# Patient Record
Sex: Female | Born: 1970
Health system: Southern US, Community
[De-identification: ages and names within clinical notes are randomized; demographics above are authoritative.]

## PROBLEM LIST (undated history)

## (undated) DIAGNOSIS — M4800 Spinal stenosis, site unspecified: Secondary | ICD-10-CM

## (undated) DIAGNOSIS — N809 Endometriosis, unspecified: Secondary | ICD-10-CM

## (undated) DIAGNOSIS — F32A Depression, unspecified: Secondary | ICD-10-CM

## (undated) DIAGNOSIS — D649 Anemia, unspecified: Secondary | ICD-10-CM

## (undated) DIAGNOSIS — M810 Age-related osteoporosis without current pathological fracture: Secondary | ICD-10-CM

## (undated) DIAGNOSIS — E785 Hyperlipidemia, unspecified: Secondary | ICD-10-CM

## (undated) DIAGNOSIS — S83209A Unspecified tear of unspecified meniscus, current injury, unspecified knee, initial encounter: Secondary | ICD-10-CM

## (undated) DIAGNOSIS — T8859XA Other complications of anesthesia, initial encounter: Secondary | ICD-10-CM

## (undated) DIAGNOSIS — S52611A Displaced fracture of right ulna styloid process, initial encounter for closed fracture: Secondary | ICD-10-CM

## (undated) DIAGNOSIS — F419 Anxiety disorder, unspecified: Secondary | ICD-10-CM

## (undated) DIAGNOSIS — Z9889 Other specified postprocedural states: Secondary | ICD-10-CM

## (undated) DIAGNOSIS — G4486 Cervicogenic headache: Secondary | ICD-10-CM

## (undated) DIAGNOSIS — F329 Major depressive disorder, single episode, unspecified: Secondary | ICD-10-CM

## (undated) DIAGNOSIS — R112 Nausea with vomiting, unspecified: Secondary | ICD-10-CM

## (undated) HISTORY — PX: BUNIONECTOMY: SHX129

## (undated) HISTORY — DX: Hyperlipidemia, unspecified: E78.5

## (undated) HISTORY — DX: Unspecified tear of unspecified meniscus, current injury, unspecified knee, initial encounter: S83.209A

## (undated) HISTORY — DX: Anxiety disorder, unspecified: F41.9

## (undated) HISTORY — PX: SHOULDER SURGERY: SHX246

## (undated) HISTORY — PX: LAPAROSCOPIC SUPRACERVICAL HYSTERECTOMY: SUR797

## (undated) HISTORY — PX: KNEE ARTHROSCOPY: SUR90

## (undated) HISTORY — DX: Endometriosis, unspecified: N80.9

## (undated) HISTORY — DX: Anemia, unspecified: D64.9

## (undated) HISTORY — DX: Age-related osteoporosis without current pathological fracture: M81.0

## (undated) HISTORY — PX: CERVICAL SPINE SURGERY: SHX589

## (undated) HISTORY — PX: KNEE SURGERY: SHX244

## (undated) HISTORY — PX: SHOULDER ARTHROSCOPY: SHX128

## (undated) HISTORY — DX: Cervicogenic headache: G44.86

## (undated) HISTORY — DX: Spinal stenosis, site unspecified: M48.00

## (undated) HISTORY — DX: Depression, unspecified: F32.A

---
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
## 2009-07-09 HISTORY — PX: TUBAL LIGATION: SHX77

## 2010-10-04 LAB — HM COLONOSCOPY

## 2011-06-27 DIAGNOSIS — F900 Attention-deficit hyperactivity disorder, predominantly inattentive type: Secondary | ICD-10-CM | POA: Insufficient documentation

## 2011-07-10 HISTORY — PX: LAPAROSCOPIC HYSTERECTOMY: SHX1926

## 2011-07-10 HISTORY — PX: SUPRACERVICAL ABDOMINAL HYSTERECTOMY: SHX5393

## 2017-08-25 DIAGNOSIS — J209 Acute bronchitis, unspecified: Secondary | ICD-10-CM | POA: Diagnosis not present

## 2017-10-24 DIAGNOSIS — Z6835 Body mass index (BMI) 35.0-35.9, adult: Secondary | ICD-10-CM | POA: Diagnosis not present

## 2017-10-24 DIAGNOSIS — R635 Abnormal weight gain: Secondary | ICD-10-CM | POA: Diagnosis not present

## 2017-10-24 DIAGNOSIS — Z1151 Encounter for screening for human papillomavirus (HPV): Secondary | ICD-10-CM | POA: Diagnosis not present

## 2017-10-24 DIAGNOSIS — Z01419 Encounter for gynecological examination (general) (routine) without abnormal findings: Secondary | ICD-10-CM | POA: Diagnosis not present

## 2017-10-24 DIAGNOSIS — Z1231 Encounter for screening mammogram for malignant neoplasm of breast: Secondary | ICD-10-CM | POA: Diagnosis not present

## 2017-10-24 LAB — HM PAP SMEAR

## 2017-10-24 LAB — HM MAMMOGRAPHY

## 2017-10-28 LAB — HM PAP SMEAR

## 2017-12-30 LAB — HM COLONOSCOPY

## 2017-12-30 LAB — HM MAMMOGRAPHY

## 2017-12-30 LAB — HM PAP SMEAR

## 2018-01-01 ENCOUNTER — Other Ambulatory Visit: Payer: Self-pay | Admitting: Family Medicine

## 2018-01-01 MED ORDER — ESCITALOPRAM OXALATE 10 MG PO TABS: 10.0000 mg | ORAL_TABLET | Freq: Every day | ORAL | 0 refills | Status: DC

## 2018-01-01 NOTE — Progress Notes (Signed)
Patient has long wait until she is in with PCP. Has long standing history of anxiety. Would like to start back on this. Starting to affect her job/home life. Was on lexapro in the past and would like to try this again. Starting low dose at 10mg . Side effects discussed. If any issues she is to let me know. Will f/u with pcp.

## 2018-01-06 ENCOUNTER — Other Ambulatory Visit: Payer: Self-pay | Admitting: Family Medicine

## 2018-01-06 MED ORDER — BUSPIRONE HCL 7.5 MG PO TABS: 7.5000 mg | ORAL_TABLET | Freq: Three times a day (TID) | ORAL | 1 refills | Status: DC

## 2018-01-06 NOTE — Progress Notes (Signed)
Did not tolerate lexapro. She had a bad panic attack and does not want to stick this out as I explained that can make anxiety worse, but usually gets better after 2 weeks. We are going to try buspar instead and see how she does on this. Went over medication in detail, side effects, proper way to take, etc..

## 2018-01-20 MED FILL — ESTRADIOL 10 MCG TABS: 10 | 28 days supply | Qty: 8 | Fill #0

## 2018-02-05 DIAGNOSIS — H524 Presbyopia: Secondary | ICD-10-CM | POA: Diagnosis not present

## 2018-02-05 DIAGNOSIS — H5203 Hypermetropia, bilateral: Secondary | ICD-10-CM | POA: Diagnosis not present

## 2018-02-05 DIAGNOSIS — H52223 Regular astigmatism, bilateral: Secondary | ICD-10-CM | POA: Diagnosis not present

## 2018-02-07 DIAGNOSIS — E669 Obesity, unspecified: Secondary | ICD-10-CM | POA: Diagnosis not present

## 2018-02-07 DIAGNOSIS — F4320 Adjustment disorder, unspecified: Secondary | ICD-10-CM | POA: Diagnosis not present

## 2018-02-07 DIAGNOSIS — Z6835 Body mass index (BMI) 35.0-35.9, adult: Secondary | ICD-10-CM | POA: Diagnosis not present

## 2018-02-07 MED FILL — busPIRone HCL 7.5 MG TABS: 7.5 | 30 days supply | Qty: 90 | Fill #0

## 2018-02-07 MED FILL — FLUoxetine HCL 10 MG CAPS: 10 | 90 days supply | Qty: 90 | Fill #0

## 2018-02-10 ENCOUNTER — Other Ambulatory Visit: Payer: Self-pay | Admitting: *Deleted

## 2018-02-10 ENCOUNTER — Other Ambulatory Visit: Payer: Self-pay | Admitting: Family Medicine

## 2018-02-10 MED ORDER — HYDROXYZINE HCL 25 MG PO TABS: 25.0000 mg | ORAL_TABLET | Freq: Three times a day (TID) | ORAL | 1 refills | Status: DC | PRN

## 2018-02-10 MED FILL — hydrOXYzine HCL 25 MG TABS: 25 | 30 days supply | Qty: 90 | Fill #0

## 2018-02-10 NOTE — Progress Notes (Signed)
Hydroxyzine prn for panic attacks.

## 2018-02-10 NOTE — Telephone Encounter (Signed)
Called [facility] and cancelled Rx for Hydroxyine and resent Rx to [place].

## 2018-02-11 ENCOUNTER — Encounter: Payer: Self-pay | Admitting: Family Medicine

## 2018-02-11 ENCOUNTER — Other Ambulatory Visit: Payer: Self-pay | Admitting: Family Medicine

## 2018-02-11 ENCOUNTER — Ambulatory Visit (INDEPENDENT_AMBULATORY_CARE_PROVIDER_SITE_OTHER): Payer: Self-pay | Admitting: Family Medicine

## 2018-02-11 VITALS — BP 126/70 | HR 80 | Temp 97.8°F | Resp 16 | Ht 64.0 in | Wt 203.0 lb

## 2018-02-11 DIAGNOSIS — Z79890 Hormone replacement therapy (postmenopausal): Secondary | ICD-10-CM | POA: Insufficient documentation

## 2018-02-11 DIAGNOSIS — Z0000 Encounter for general adult medical examination without abnormal findings: Secondary | ICD-10-CM

## 2018-02-11 DIAGNOSIS — R5383 Other fatigue: Secondary | ICD-10-CM

## 2018-02-11 DIAGNOSIS — E782 Mixed hyperlipidemia: Secondary | ICD-10-CM

## 2018-02-11 DIAGNOSIS — E559 Vitamin D deficiency, unspecified: Secondary | ICD-10-CM

## 2018-02-11 DIAGNOSIS — M4802 Spinal stenosis, cervical region: Secondary | ICD-10-CM | POA: Insufficient documentation

## 2018-02-11 DIAGNOSIS — E7849 Other hyperlipidemia: Secondary | ICD-10-CM | POA: Insufficient documentation

## 2018-02-11 DIAGNOSIS — F411 Generalized anxiety disorder: Secondary | ICD-10-CM

## 2018-02-11 DIAGNOSIS — E785 Hyperlipidemia, unspecified: Secondary | ICD-10-CM | POA: Insufficient documentation

## 2018-02-11 DIAGNOSIS — Z23 Encounter for immunization: Secondary | ICD-10-CM

## 2018-02-11 LAB — CBC WITH DIFFERENTIAL/PLATELET
BASOS PCT: 0.5 % (ref 0.0–3.0)
Basophils Absolute: 0 10*3/uL (ref 0.0–0.1)
EOS ABS: 0.2 10*3/uL (ref 0.0–0.7)
Eosinophils Relative: 4 % (ref 0.0–5.0)
HEMATOCRIT: 37.9 % (ref 36.0–46.0)
Hemoglobin: 13.2 g/dL (ref 12.0–15.0)
LYMPHS ABS: 1.2 10*3/uL (ref 0.7–4.0)
LYMPHS PCT: 27.3 % (ref 12.0–46.0)
MCHC: 34.7 g/dL (ref 30.0–36.0)
MCV: 84.5 fl (ref 78.0–100.0)
MONOS PCT: 5.9 % (ref 3.0–12.0)
Monocytes Absolute: 0.3 10*3/uL (ref 0.1–1.0)
NEUTROS ABS: 2.7 10*3/uL (ref 1.4–7.7)
NEUTROS PCT: 62.3 % (ref 43.0–77.0)
PLATELETS: 209 10*3/uL (ref 150.0–400.0)
RBC: 4.49 Mil/uL (ref 3.87–5.11)
RDW: 12.8 % (ref 11.5–15.5)
WBC: 4.3 10*3/uL (ref 4.0–10.5)

## 2018-02-11 LAB — COMPREHENSIVE METABOLIC PANEL
ALT: 14 U/L (ref 0–35)
AST: 13 U/L (ref 0–37)
Albumin: 4.4 g/dL (ref 3.5–5.2)
Alkaline Phosphatase: 68 U/L (ref 39–117)
BUN: 12 mg/dL (ref 6–23)
CALCIUM: 9.4 mg/dL (ref 8.4–10.5)
CHLORIDE: 104 meq/L (ref 96–112)
CO2: 28 meq/L (ref 19–32)
Creatinine, Ser: 0.92 mg/dL (ref 0.40–1.20)
GFR: 69.12 mL/min (ref >60.00)
GLUCOSE: 94 mg/dL (ref 70–99)
Potassium: 3.6 mEq/L (ref 3.5–5.1)
Sodium: 140 mEq/L (ref 135–145)
Total Bilirubin: 0.7 mg/dL (ref 0.2–1.2)
Total Protein: 7.1 g/dL (ref 6.0–8.3)

## 2018-02-11 LAB — LIPID PANEL
CHOL/HDL RATIO: 4
Cholesterol: 259 mg/dL — ABNORMAL HIGH (ref 0–200)
HDL: 69.8 mg/dL (ref >39.00)
LDL Cholesterol: 177 mg/dL — ABNORMAL HIGH (ref 0–99)
NONHDL: 189.68
Triglycerides: 62 mg/dL (ref 0.0–149.0)
VLDL: 12.4 mg/dL (ref 0.0–40.0)

## 2018-02-11 LAB — VITAMIN D 25 HYDROXY (VIT D DEFICIENCY, FRACTURES): VITD: 19.68 ng/mL — AB (ref 30.00–100.00)

## 2018-02-11 LAB — TSH: TSH: 1.37 u[IU]/mL (ref 0.35–4.50)

## 2018-02-11 MED ORDER — VITAMIN D (ERGOCALCIFEROL) 1.25 MG (50000 UNIT) PO CAPS: ORAL_CAPSULE | ORAL | 0 refills | Status: DC

## 2018-02-11 MED ORDER — CYCLOBENZAPRINE HCL 10 MG PO TABS: 10.0000 mg | ORAL_TABLET | Freq: Three times a day (TID) | ORAL | 1 refills | Status: DC | PRN

## 2018-02-11 MED FILL — CYCLOBENZAPRINE 10 MG TAB: 10 | 10 days supply | Qty: 30 | Fill #0

## 2018-02-11 MED FILL — VIT D2 1.25 MG (50,000 UNIT: 1.25 MG | 84 days supply | Qty: 12 | Fill #0

## 2018-02-11 NOTE — Progress Notes (Unsigned)
Patient: Christine Nash MRN: [id number] DOB: 07/28/1970 PCP: No primary care provider on file.     Subjective:  No chief complaint on file.   HPI: The patient is a 47 y.o. female who presents today for annual exam. She denies any changes to past medical history. There have been no recent hospitalizations. They are following a well balanced diet and exercise plan. Weight has been stable. Would like to discuss anxiety today.   Anxiety: History of remote anxiety.   There is no immunization history on file for this patient.   Colonoscopy: Mammogram: 12/30/2017 Pap smear: 12/30/2017 Tdap:  hiv screen: done    Review of Systems  Allergies Patient has no allergies on file.  Past Medical History Patient  has no past medical history on file.  Surgical History Patient  has a past surgical history that includes Cervical spine surgery (2011,2012,2017); Cesarean section (3329,5188); Tubal ligation (2011); and Laparoscopic hysterectomy (2013).  Family History Pateint's family history is not on file.  Social History Patient      Objective: There were no vitals filed for this visit.  There is no height or weight on file to calculate BMI.  Physical Exam     Assessment/plan:    No follow-ups on file.     Orma Flaming, MD [place]  02/11/2018
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
## 2018-02-21 MED FILL — ESTRADIOL 0.5 MG TABLET: 0.5 | 30 days supply | Qty: 30 | Fill #0

## 2018-03-24 MED FILL — ESTRADIOL 0.5 MG TABLET: 0.5 | 30 days supply | Qty: 30 | Fill #1

## 2018-03-24 MED FILL — ESTRADIOL 10 MCG TABS: 10 | 28 days supply | Qty: 8 | Fill #1

## 2018-04-03 MED FILL — busPIRone HCL 7.5 MG TABS: 7.5 | 30 days supply | Qty: 90 | Fill #0

## 2018-04-03 MED FILL — hydrOXYzine HCL 25 MG TABS: 25 | 30 days supply | Qty: 90 | Fill #1

## 2018-04-16 ENCOUNTER — Other Ambulatory Visit: Payer: Self-pay | Admitting: Family Medicine

## 2018-04-16 MED ORDER — BACLOFEN 20 MG PO TABS: 20.0000 mg | ORAL_TABLET | Freq: Three times a day (TID) | ORAL | 0 refills | Status: DC

## 2018-04-16 MED FILL — BACLOFEN 20 MG TABLET: 20 | 30 days supply | Qty: 90 | Fill #0

## 2018-05-03 ENCOUNTER — Other Ambulatory Visit: Payer: Self-pay | Admitting: Family Medicine

## 2018-05-03 MED ORDER — AZITHROMYCIN 250 MG PO TABS: ORAL_TABLET | ORAL | 0 refills | Status: DC

## 2018-05-03 NOTE — Progress Notes (Signed)
Pt called and stated getting worse form when I listened to her and saw her this week. No fever/chills. Precautions given and zpack sent in.

## 2018-05-12 MED FILL — ESTRADIOL 0.5 MG TABLET: 0.5 | 30 days supply | Qty: 30 | Fill #2

## 2018-05-12 MED FILL — ESTRADIOL 10 MCG TABS: 10 | 28 days supply | Qty: 8 | Fill #2

## 2018-05-29 ENCOUNTER — Other Ambulatory Visit: Payer: Self-pay | Admitting: Family Medicine

## 2018-05-29 MED ORDER — BACLOFEN 20 MG PO TABS: 20.0000 mg | ORAL_TABLET | Freq: Three times a day (TID) | ORAL | 1 refills | Status: DC

## 2018-05-29 MED FILL — BACLOFEN 20 MG TABS: 20 | 30 days supply | Qty: 90 | Fill #0

## 2018-06-16 MED FILL — ESTRADIOL 0.5 MG TABLET: 0.5 | 30 days supply | Qty: 30 | Fill #3

## 2018-06-23 ENCOUNTER — Other Ambulatory Visit: Payer: Self-pay | Admitting: Family Medicine

## 2018-06-23 MED ORDER — BUSPIRONE HCL 7.5 MG PO TABS: 7.5000 mg | ORAL_TABLET | Freq: Three times a day (TID) | ORAL | 1 refills | Status: DC

## 2018-06-23 MED FILL — busPIRone HCL 7.5 MG TABS: 7.5 | 30 days supply | Qty: 90 | Fill #0

## 2018-07-03 MED FILL — ESTRADIOL 10 MCG TABS: 10 | 28 days supply | Qty: 8 | Fill #3

## 2018-07-03 NOTE — Progress Notes (Signed)
Christine Nash is a 47 y.o. female is here for   History of Present Illness:   Christine Nash, CMA acting as scribe for Dr. Briscoe Nash.   HPI: Patient in office for evaluation on weight management. She has tried phentermine in 2003 and lost 35lbs with it. She has been on adderall in past for ADD. She is not currently taking anything for ADD at this time. Found that Adderall 10 mg q am helped with focus but made her have hand tremors. Not drinking caffeine regularly. She exercises one day a week for about 45min. She has tried different diets in the past with good results but gained weight back soon after. Married x 12 years and very happy, working at [employer] x 7 months and happy. Sleeps fairly well, takes baclofen for chronic neck pain. No ETOH, drugs, supplements. Some binging behavior - eating after full. No purging or restricting. Eating well at work - mostly salads. Eating at restaurants too often at night. 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 No barriers to success otherwise.   Health Maintenance Due  Topic Date Due  . INFLUENZA VACCINE  02/06/2018   Depression screen PHQ 2/9 02/11/2018  Decreased Interest 1  Down, Depressed, Hopeless 1  PHQ - 2 Score 2  Altered sleeping 0  Tired, decreased energy 2  Change in appetite 1  Feeling bad or failure about yourself  2  Trouble concentrating 1  Moving slowly or fidgety/restless 0  Suicidal thoughts 0  PHQ-9 Score 8  Difficult doing work/chores Not difficult at all   PMHx, SurgHx, SocialHx, FamHx, Medications, and Allergies were reviewed in the Visit Navigator and updated as appropriate.   Patient Active Problem List   Diagnosis Date Noted  . Generalized anxiety disorder 02/11/2018  . Hormone replacement therapy (HRT) 02/11/2018  . Spinal stenosis of cervical region 02/11/2018  . Hyperlipidemia 02/11/2018  . Vitamin D deficiency 02/11/2018  . Attention deficit hyperactivity disorder, predominantly inattentive type  06/27/2011   Social History   Tobacco Use  . Smoking status: Never Smoker  . Smokeless tobacco: Never Used  Substance Use Topics  . Alcohol use: Not Currently    Frequency: Never  . Drug use: Never   Current Medications and Allergies:   .  baclofen (LIORESAL) 20 MG tablet, Take 1 tablet (20 mg total) by mouth 3 (three) times daily., Disp: 90 tablet, Rfl: 1 .  busPIRone (BUSPAR) 7.5 MG tablet, Take 1 tablet (7.5 mg total) by mouth 3 (three) times daily., Disp: 90 tablet, Rfl: 1 .  estradiol (ESTRACE) 0.5 MG tablet, Take 0.5 mg by mouth daily., Disp: , Rfl: 10 .  Estradiol 10 MCG TABS vaginal tablet, INSERT 1 TABLET INTO THE VAGINA TWICE A WEEK, Disp: , Rfl: 0 .  hydrOXYzine (ATARAX/VISTARIL) 25 MG tablet, Take 1 tablet (25 mg total) by mouth 3 (three) times daily as needed for anxiety., Disp: 90 tablet, Rfl: 1 .  Vitamin D, Ergocalciferol, (DRISDOL) 50000 units CAPS capsule, One capsule by mouth once a week for 12 weeks. Then take 2000IU/day, Disp: 12 capsule, Rfl: 0   Allergies  Allergen Reactions  . Meperidine Hcl Hives  . Prochlorperazine Edisylate     Other reaction(s): agitiation   Review of Systems   Pertinent items are noted in the HPI. Otherwise, a complete ROS is negative.  Vitals:   Vitals:   07/04/18 1030  BP: 124/82  Pulse: 72  Temp: (!) 97.2 F (36.2 C)  TempSrc: Oral  SpO2: 98%  Weight: 210 lb 6.4 oz (95.4 kg)  Height: 5\' 4"  (1.626 m)     Body mass index is 36.12 kg/m.  Physical Exam:   Physical Exam Vitals signs and nursing note reviewed.  HENT:     Head: Normocephalic and atraumatic.  Eyes:     Pupils: Pupils are equal, round, and reactive to light.  Neck:     Musculoskeletal: Normal range of motion and neck supple.  Cardiovascular:     Rate and Rhythm: Normal rate and regular rhythm.     Heart sounds: Normal heart sounds.  Pulmonary:     Effort: Pulmonary effort is normal.  Abdominal:     Palpations: Abdomen is soft.  Skin:    General:  Skin is warm.  Psychiatric:        Behavior: Behavior normal.    Results for orders placed or performed in visit on 07/04/18  POCT glycosylated hemoglobin (Hb A1C)  Result Value Ref Range   Hemoglobin A1C 5.0 4.0 - 5.6 %   HbA1c POC (<> result, manual entry)     HbA1c, POC (prediabetic range)     HbA1c, POC (controlled diabetic range)     Assessment and Plan:   Christine Nash was seen today for obesity.  Diagnoses and all orders for this visit:  Generalized anxiety disorder Comments: Trial propranolol to help with anxiety, tremors, headaches, BP elevations.  Orders: -     propranolol (INDERAL) 20 MG tablet; Take 1 tablet (20 mg total) by mouth 3 (three) times daily as needed.  Hyperglycemia Comments: Normal A1c today.  Orders: -     POCT glycosylated hemoglobin (Hb A1C)  Weight gain Comments: 7 pounds since August visit with PCP.  Orders: -     POCT glycosylated hemoglobin (Hb A1C) -     phentermine (ADIPEX-P) 37.5 MG tablet; 1/2 to 1 po q am  Binge eating Comments: Mild to moderate. She will work on mindful eating and non-food coping skills. Orders: -     phentermine (ADIPEX-P) 37.5 MG tablet; 1/2 to 1 po q am  Attention deficit hyperactivity disorder, predominantly inattentive type Comments: May try Vyvanse in the future.   Morbid obesity ([place]) Comments: Patient complains of obesity. Patient cites health, increased physical ability, self-image as reasons for wanting to lose weight.  BMI Readings from Last 3 Encounters:  07/04/18 36.12 kg/m  02/11/18 34.84 kg/m   Wt Readings from Last 3 Encounters:  07/04/18 210 lb 6.4 oz (95.4 kg)  02/11/18 203 lb (92.1 kg)   She feels ideal weight is 65 pounds lower than she is currently..   Contraindications to weight loss: none. Patient readiness to commit to diet and activity changes: excellent. Barriers to weight loss: time limitations.  I assessed Christine Nash to be in an action stage with respect to weight loss.     Plan: 1. Diagnostic studies to rule out secondary causes of obesity: see below. 2. General patient education:  a. Importance of long-term maintenance treatment in weight loss. b. Use non-food self-rewards to reinforce behavior changes. c. Elicit support from others; identify saboteurs. 3. Diet interventions:  a. Risks of dieting were reviewed, including fatigue, temporary hair loss, gallstone formation, gout, and with very low calorie diets, electrolyte abnormalities, nutrient inadequacies, and loss of lean body mass. 4. Exercise intervention:  a. Informal measures, e.g. taking stairs instead of elevator. b. Formal exercise regimen options discussed as well. 5. Other behavioral treatment: stress management. 6. Other treatment: Medication: phentermine. 7. Patient to keep a weight  log that we will review at follow up. 8. Follow up: 1 month and as needed.   . Orders and follow up as documented in [place], reviewed diet, exercise and weight control, cardiovascular risk and specific lipid/LDL goals reviewed, reviewed medications and side effects in detail.  . Reviewed expectations re: course of current medical issues. . Outlined signs and symptoms indicating need for more acute intervention. . Patient verbalized understanding and all questions were answered. . Patient received an After Visit Summary.  CMA served as Education administrator during this visit. History, Physical, and Plan performed by medical provider. The above documentation has been reviewed and is accurate and complete. Christine Nash, D.O.  Christine Deutscher, DO [place], [facility] 07/04/2018

## 2018-07-04 ENCOUNTER — Encounter: Payer: Self-pay | Admitting: Family Medicine

## 2018-07-04 ENCOUNTER — Ambulatory Visit (INDEPENDENT_AMBULATORY_CARE_PROVIDER_SITE_OTHER): Payer: 59 | Admitting: Family Medicine

## 2018-07-04 VITALS — BP 124/82 | HR 72 | Temp 97.2°F | Ht 64.0 in | Wt 210.4 lb

## 2018-07-04 DIAGNOSIS — R632 Polyphagia: Secondary | ICD-10-CM | POA: Diagnosis not present

## 2018-07-04 DIAGNOSIS — R635 Abnormal weight gain: Secondary | ICD-10-CM

## 2018-07-04 DIAGNOSIS — F411 Generalized anxiety disorder: Secondary | ICD-10-CM | POA: Diagnosis not present

## 2018-07-04 DIAGNOSIS — F900 Attention-deficit hyperactivity disorder, predominantly inattentive type: Secondary | ICD-10-CM | POA: Diagnosis not present

## 2018-07-04 DIAGNOSIS — R739 Hyperglycemia, unspecified: Secondary | ICD-10-CM | POA: Diagnosis not present

## 2018-07-04 LAB — POCT GLYCOSYLATED HEMOGLOBIN (HGB A1C): Hemoglobin A1C: 5 % (ref 4.0–5.6)

## 2018-07-04 MED ORDER — PROPRANOLOL HCL 20 MG PO TABS: 20.0000 mg | ORAL_TABLET | Freq: Three times a day (TID) | ORAL | 3 refills | Status: DC | PRN

## 2018-07-04 MED ORDER — PHENTERMINE HCL 37.5 MG PO TABS: ORAL_TABLET | ORAL | 3 refills | Status: DC

## 2018-07-04 MED FILL — PHENTERMINE 37.5 MG TABLET: 37.5 | 30 days supply | Qty: 30 | Fill #0

## 2018-07-04 MED FILL — PROPRANOLOL 20 MG TABLET: 20 | 10 days supply | Qty: 30 | Fill #0

## 2018-07-11 ENCOUNTER — Other Ambulatory Visit: Payer: Self-pay | Admitting: Family Medicine

## 2018-07-11 ENCOUNTER — Other Ambulatory Visit: Payer: Self-pay

## 2018-07-11 DIAGNOSIS — F411 Generalized anxiety disorder: Secondary | ICD-10-CM

## 2018-07-11 MED ORDER — PROPRANOLOL HCL 20 MG PO TABS: 20.0000 mg | ORAL_TABLET | Freq: Three times a day (TID) | ORAL | 3 refills | Status: DC | PRN

## 2018-07-11 MED ORDER — TOPIRAMATE ER 25 MG PO CAP24: 1.0000 | ORAL_CAPSULE | Freq: Every day | ORAL | 1 refills | Status: DC

## 2018-07-11 MED ORDER — PHENTERMINE HCL 15 MG PO CAPS: 15.0000 mg | ORAL_CAPSULE | ORAL | 1 refills | Status: DC

## 2018-07-11 MED FILL — TROKENDI XR 25 MG CAPSULE: 25 | 30 days supply | Qty: 30 | Fill #0

## 2018-07-16 MED FILL — ESTRADIOL 0.5 MG TABLET: 0.5 | 30 days supply | Qty: 30 | Fill #4

## 2018-07-17 ENCOUNTER — Other Ambulatory Visit: Payer: Self-pay | Admitting: Family Medicine

## 2018-07-17 MED ORDER — HYDROXYZINE HCL 25 MG PO TABS: 25.0000 mg | ORAL_TABLET | Freq: Three times a day (TID) | ORAL | 1 refills | Status: DC | PRN

## 2018-07-17 MED FILL — hydrOXYzine HCL 25 MG TABS: 25 | 30 days supply | Qty: 90 | Fill #0

## 2018-07-25 ENCOUNTER — Other Ambulatory Visit: Payer: Self-pay | Admitting: Family Medicine

## 2018-07-25 MED FILL — BACLOFEN 20 MG TABS: 20 | 30 days supply | Qty: 90 | Fill #1

## 2018-07-25 NOTE — Progress Notes (Signed)
Error

## 2018-07-30 ENCOUNTER — Ambulatory Visit: Payer: Self-pay | Admitting: Family Medicine

## 2018-08-18 MED FILL — ESTRADIOL 0.5 MG TABLET: 0.5 | 30 days supply | Qty: 30 | Fill #5 | Status: TO

## 2018-09-03 ENCOUNTER — Other Ambulatory Visit (INDEPENDENT_AMBULATORY_CARE_PROVIDER_SITE_OTHER): Payer: 59 | Admitting: *Deleted

## 2018-09-03 ENCOUNTER — Other Ambulatory Visit: Payer: Self-pay | Admitting: Family Medicine

## 2018-09-03 DIAGNOSIS — G43009 Migraine without aura, not intractable, without status migrainosus: Secondary | ICD-10-CM

## 2018-09-03 MED ORDER — KETOROLAC TROMETHAMINE 60 MG/2ML IM SOLN
60.0000 mg | Freq: Once | INTRAMUSCULAR | Status: AC
  Administered 2018-09-03: 60 mg via INTRAMUSCULAR

## 2018-09-03 MED ORDER — ONDANSETRON 4 MG PO TBDP: 4.0000 mg | ORAL_TABLET | Freq: Three times a day (TID) | ORAL | 2 refills | Status: DC | PRN

## 2018-09-03 MED FILL — ONDANSETRON ODT 4 MG TABLET: 4 | 7 days supply | Qty: 20 | Fill #0

## 2018-09-03 MED FILL — ESTRADIOL 10 MCG TABS: 10 | 28 days supply | Qty: 8 | Fill #4 | Status: TO

## 2018-09-03 NOTE — Progress Notes (Signed)
Pt having headache and sciatica pain. Verbal order per Dr. Rogers Blocker to give Toradol 60 mg per 2 ml IM. Pt tolerated well.

## 2018-09-03 NOTE — Progress Notes (Signed)
toradol shot. Terrible migraine and sciatica pain. No nsaids today.

## 2018-09-10 ENCOUNTER — Other Ambulatory Visit: Payer: Self-pay | Admitting: Family Medicine

## 2018-09-10 ENCOUNTER — Other Ambulatory Visit: Payer: Self-pay

## 2018-09-10 MED ORDER — BACLOFEN 20 MG PO TABS: 20.0000 mg | ORAL_TABLET | Freq: Three times a day (TID) | ORAL | 1 refills | Status: DC

## 2018-09-10 MED FILL — BACLOFEN 20 MG TABS: 20 | 30 days supply | Qty: 90 | Fill #0 | Status: TO

## 2018-09-10 MED FILL — busPIRone HCL 7.5 MG TABS: 7.5 | 30 days supply | Qty: 90 | Fill #1

## 2018-09-16 ENCOUNTER — Other Ambulatory Visit: Payer: Self-pay

## 2018-09-16 MED ORDER — ONDANSETRON 4 MG PO TBDP: 4.0000 mg | ORAL_TABLET | Freq: Three times a day (TID) | ORAL | 2 refills | Status: DC | PRN

## 2018-09-17 ENCOUNTER — Other Ambulatory Visit: Payer: Self-pay

## 2018-09-17 MED ORDER — LIRAGLUTIDE -WEIGHT MANAGEMENT 18 MG/3ML ~~LOC~~ SOPN: 1.2000 mg | PEN_INJECTOR | Freq: Every day | SUBCUTANEOUS | 2 refills | Status: DC

## 2018-09-18 MED FILL — ONDANSETRON ODT 4 MG TABLET: 4 | 7 days supply | Qty: 20 | Fill #0

## 2018-09-23 ENCOUNTER — Telehealth: Payer: Self-pay

## 2018-09-23 NOTE — Telephone Encounter (Signed)
Christine Nash (Key: [id number])  Rx #: [id number]  Saxenda 18MG /3ML pen-injectors  Form [employer] ePA Form  Created  5 days ago  Sent to Plan  4 minutes ago  Plan Response  4 minutes ago  Submit Clinical Questions  less than a minute ago  Determination  Wait for Determination Please wait for [employer] to return a determination.

## 2018-09-25 NOTE — Telephone Encounter (Signed)
Christine Nash, with Cover My Meds, states this medication was denied but request can be resubmitted with additional notes.   Cover my meds #  [phone] Key- [id number]

## 2018-09-25 NOTE — Telephone Encounter (Signed)
Let me know what documentation is needed. Make sure to give her another sample.

## 2018-09-25 NOTE — Telephone Encounter (Signed)
See note

## 2018-09-25 NOTE — Telephone Encounter (Signed)
Sending to Dr. Juleen China team

## 2018-10-01 MED FILL — ESTRADIOL 0.5 MG TABS: 0.5 | 30 days supply | Qty: 30 | Fill #0

## 2018-10-09 ENCOUNTER — Other Ambulatory Visit: Payer: Self-pay

## 2018-10-09 NOTE — Telephone Encounter (Signed)
Called patient to get update on all medications she has tried and failed all listed below will fax this notes to insurance to help with approval.   Failed medications: Wellbutrin not helpful gave patient nausea and vomiting Phentermine: palpitations and chest pain Trokendi: brain fog could not focus  Propranolol: tired brain fog fatigue   She is currently taking the Buspar with no issues at this time. I will add the above medications to allergy list to make sure it is documented for future visits. She also is having success with the saxenda. She has been on for several months with the samples and do not feel that it would be beneficial to change at this time.

## 2018-10-13 MED FILL — BACLOFEN 20 MG TABLET: 20 | 30 days supply | Qty: 90 | Fill #0

## 2018-10-13 NOTE — Telephone Encounter (Signed)
After being on hold for over 36 minutes they informed me that the documentation that I have sent in did not meet requirements for why patient can not take Contrave. Please advise

## 2018-10-13 NOTE — Telephone Encounter (Signed)
Because Contrave contains bupropion which is the same as Wellbutrin (allergy).

## 2018-10-14 ENCOUNTER — Telehealth: Payer: Self-pay | Admitting: Family Medicine

## 2018-10-14 NOTE — Telephone Encounter (Signed)
[employer] called with some prior Does the patient thave any contraindication to contrave?  If yes please specify.  Is there a medical reason that the patient is not able to take contrave, if yes rationale.   fax [phone], ref# [id number]

## 2018-10-14 NOTE — Telephone Encounter (Signed)
Information faxed to [facility].

## 2018-10-16 ENCOUNTER — Other Ambulatory Visit: Payer: Self-pay

## 2018-10-16 MED ORDER — LIRAGLUTIDE -WEIGHT MANAGEMENT 18 MG/3ML ~~LOC~~ SOPN: 1.2000 mg | PEN_INJECTOR | Freq: Every day | SUBCUTANEOUS | 2 refills | Status: DC

## 2018-10-16 MED FILL — SAXENDA 18 MG/3 ML PEN: 18 | 90 days supply | Qty: 18 | Fill #0

## 2018-10-16 NOTE — Telephone Encounter (Signed)
auth received [id number] effective from 10/16/2018 to 02/11/2019. Patient informed and pharmacy of authorization

## 2018-10-16 NOTE — Telephone Encounter (Signed)
Called and gave information she states that she will run as rush and to check back in 2 hrs for answer.

## 2018-10-16 NOTE — Telephone Encounter (Signed)
Called and f/u on still no answer.

## 2018-10-17 MED FILL — UNIFINE PENTIPS 32GX5/32: 32G X 4 MM | 30 days supply | Qty: 100 | Fill #0

## 2018-10-17 MED FILL — UNIFINE PENTIPS 32GX5/32": 32G X 4 MM | 30 days supply | Qty: 100 | Fill #0

## 2018-10-21 MED FILL — ESTRADIOL 10 MCG TABS: 10 | 28 days supply | Qty: 8 | Fill #0

## 2018-10-25 MED FILL — ESTRADIOL 0.5 MG TABS: 0.5 | 30 days supply | Qty: 30 | Fill #0

## 2018-10-31 ENCOUNTER — Other Ambulatory Visit: Payer: Self-pay | Admitting: Family Medicine

## 2018-10-31 MED ORDER — DOXYCYCLINE HYCLATE 100 MG PO TABS: 100.0000 mg | ORAL_TABLET | Freq: Two times a day (BID) | ORAL | 0 refills | Status: DC

## 2018-10-31 NOTE — Progress Notes (Signed)
Tick bite with headache, myalgia and arthralgia. No fever/rash. 7 day course of doxy given. covid testing negative.

## 2018-11-11 DIAGNOSIS — N6311 Unspecified lump in the right breast, upper outer quadrant: Secondary | ICD-10-CM | POA: Diagnosis not present

## 2018-11-13 ENCOUNTER — Other Ambulatory Visit: Payer: Self-pay | Admitting: Obstetrics and Gynecology

## 2018-11-13 DIAGNOSIS — N6310 Unspecified lump in the right breast, unspecified quadrant: Secondary | ICD-10-CM

## 2018-11-24 ENCOUNTER — Other Ambulatory Visit: Payer: Self-pay | Admitting: Obstetrics and Gynecology

## 2018-11-24 DIAGNOSIS — N6310 Unspecified lump in the right breast, unspecified quadrant: Secondary | ICD-10-CM

## 2018-11-24 DIAGNOSIS — N6320 Unspecified lump in the left breast, unspecified quadrant: Secondary | ICD-10-CM

## 2018-11-25 ENCOUNTER — Ambulatory Visit
Admission: RE | Admit: 2018-11-25 | Discharge: 2018-11-25 | Disposition: A | Discharge status: Home / Self Care | Payer: 59 | Source: Ambulatory Visit | Attending: Obstetrics and Gynecology | Admitting: Obstetrics and Gynecology

## 2018-11-25 ENCOUNTER — Other Ambulatory Visit: Payer: 59

## 2018-11-25 ENCOUNTER — Other Ambulatory Visit: Payer: Self-pay

## 2018-11-25 DIAGNOSIS — N6310 Unspecified lump in the right breast, unspecified quadrant: Secondary | ICD-10-CM

## 2018-11-25 DIAGNOSIS — N6320 Unspecified lump in the left breast, unspecified quadrant: Secondary | ICD-10-CM | POA: Diagnosis not present

## 2018-12-08 ENCOUNTER — Ambulatory Visit: Payer: 59 | Admitting: Family Medicine

## 2018-12-08 ENCOUNTER — Other Ambulatory Visit: Payer: Self-pay

## 2018-12-08 MED ORDER — LIRAGLUTIDE -WEIGHT MANAGEMENT 18 MG/3ML ~~LOC~~ SOPN: 1.2000 mg | PEN_INJECTOR | Freq: Every day | SUBCUTANEOUS | 2 refills | Status: DC

## 2018-12-10 ENCOUNTER — Ambulatory Visit (INDEPENDENT_AMBULATORY_CARE_PROVIDER_SITE_OTHER): Payer: 59 | Admitting: Family Medicine

## 2018-12-10 ENCOUNTER — Ambulatory Visit (INDEPENDENT_AMBULATORY_CARE_PROVIDER_SITE_OTHER): Payer: 59

## 2018-12-10 ENCOUNTER — Encounter: Payer: Self-pay | Admitting: Family Medicine

## 2018-12-10 ENCOUNTER — Other Ambulatory Visit: Payer: Self-pay | Admitting: Family Medicine

## 2018-12-10 VITALS — BP 115/62 | HR 84 | Temp 97.8°F | Wt 188.0 lb

## 2018-12-10 DIAGNOSIS — M25562 Pain in left knee: Secondary | ICD-10-CM

## 2018-12-10 MED ORDER — BUSPIRONE HCL 7.5 MG PO TABS: 7.5000 mg | ORAL_TABLET | Freq: Three times a day (TID) | ORAL | 1 refills | Status: DC

## 2018-12-10 MED ORDER — BACLOFEN 20 MG PO TABS: 20.0000 mg | ORAL_TABLET | Freq: Three times a day (TID) | ORAL | 1 refills | Status: DC

## 2018-12-10 MED FILL — BACLOFEN 20 MG TABS: 20 | 30 days supply | Qty: 90 | Fill #0

## 2018-12-10 MED FILL — BUSPIRONE HCL 7.5 MG TABS: 7.5 | 30 days supply | Qty: 90 | Fill #0

## 2018-12-10 NOTE — Progress Notes (Signed)
Patient: Christine Nash MRN: [id number] DOB: 1971/04/26 PCP: Orma Flaming, MD     Subjective:  Chief Complaint  Patient presents with  . Knee Pain    left knee    HPI: The patient is a 48 y.o. female who presents today for left knee pain. Pain started 2 months ago in the left knee. Located on medial side of her knee. She states it was slightly puffy at first, but no swelling, redness at this point. No trauma recently. She did have a knee arthroscopy to this knee in 03/2001 for meniscal tear. She did recently start walking/running on her treadmill. Running seems to be what exacerbated this. She can fully extend and flex. When it was first sore she could not fully extend it. She never heard a pop, it was more of a gradual onset. She has taken with NSAIDS with no relief. She has also iced it. She has had some radiation up the leg into groin, but she states it was only twice and went away with stretching. Pain rated as a 5/10 and described as dull/achy and throbbing. Rest/non weight bearing, elevation make it better. Twisting/running exacerbates it.   Review of Systems  Constitutional: Negative for chills and fever.  HENT: Negative for congestion, ear pain, sinus pressure and sinus pain.   Respiratory: Negative for cough, shortness of breath and wheezing.   Cardiovascular: Negative for chest pain and palpitations.  Gastrointestinal: Positive for abdominal pain. Negative for nausea and vomiting.  Musculoskeletal: Positive for arthralgias (left knee pain ). Negative for back pain, gait problem and joint swelling.  Neurological: Positive for headaches.    Allergies Patient is allergic to meperidine hcl; prochlorperazine edisylate; propranolol; trokendi xr [topiramate er]; and phentermine.  Past Medical History Patient  has no past medical history on file.  Surgical History Patient  has a past surgical history that includes Cervical spine surgery (2011,2012,2017); Cesarean section  (4235,3614); Tubal ligation (2011); and Laparoscopic hysterectomy (2013).  Family History Pateint's family history includes Breast cancer in her maternal aunt.  Social History Patient  reports that she has never smoked. She has never used smokeless tobacco. She reports previous alcohol use. She reports that she does not use drugs.    Objective: Vitals:   12/10/18 1425  BP: 115/62  Pulse: 84  Temp: 97.8 F (36.6 C)  TempSrc: Oral  SpO2: 99%  Weight: 188 lb (85.3 kg)    Body mass index is 32.27 kg/m.  Physical Exam Vitals signs reviewed.  Constitutional:      Appearance: Normal appearance.  HENT:     Head: Normocephalic and atraumatic.  Musculoskeletal:     Comments: Right knee: normal exam. Left knee: no erythema, edema, warmth, fluid. ROM intact with full flexion past 90 degrees and full extension past 180 degrees. Draw tests negative. No pain with valgus or varus strain. TTP over medial aspect of knee along medial meniscus, tibial plateau, and anserine bursa   Neurological:     Mental Status: She is alert.    Left knee Xray: no acute finding.     Assessment/plan: 1. Left medial knee pain Meniscal tear vs. Pes anserinus pain syndrome. Needs MRI to rule out meniscal tear. Handout given with exercises/treatment for pes anserinus syndrome. Will refer to ortho vs. Sports med if MRI normal. Sample of pennsaid given. Exercises to build up muscles in hamstring and stretching also will be helpful. Can start to return to activity as she has had no symptoms with walking, no swelling or  pain.  - MR Knee Left  Wo Contrast; Future  -refilled her baclofen and her buspar.   Return if symptoms worsen or fail to improve.     Orma Flaming, MD [facility]  12/10/2018

## 2018-12-12 ENCOUNTER — Other Ambulatory Visit: Payer: Self-pay

## 2018-12-12 MED ORDER — LIRAGLUTIDE -WEIGHT MANAGEMENT 18 MG/3ML ~~LOC~~ SOPN: 3.0000 mg | PEN_INJECTOR | Freq: Every day | SUBCUTANEOUS | 2 refills | Status: DC

## 2018-12-15 ENCOUNTER — Other Ambulatory Visit: Payer: Self-pay

## 2018-12-15 MED ORDER — LIRAGLUTIDE -WEIGHT MANAGEMENT 18 MG/3ML ~~LOC~~ SOPN: 3.0000 mg | PEN_INJECTOR | Freq: Every day | SUBCUTANEOUS | 2 refills | Status: DC

## 2018-12-15 MED FILL — ESTRADIOL 0.5 MG TABS: 0.5 | 30 days supply | Qty: 30 | Fill #1

## 2018-12-15 MED FILL — SAXENDA 18 MG/3 ML PEN: 18 | 30 days supply | Qty: 15 | Fill #0

## 2018-12-23 ENCOUNTER — Ambulatory Visit (HOSPITAL_COMMUNITY): Payer: 59

## 2018-12-25 MED FILL — ESTRADIOL 10 MCG TABS: 10 | 28 days supply | Qty: 8 | Fill #0

## 2019-01-14 ENCOUNTER — Other Ambulatory Visit: Payer: Self-pay

## 2019-01-14 ENCOUNTER — Encounter (HOSPITAL_COMMUNITY): Payer: Self-pay | Admitting: *Deleted

## 2019-01-14 ENCOUNTER — Emergency Department (HOSPITAL_COMMUNITY): Payer: 59

## 2019-01-14 ENCOUNTER — Emergency Department (HOSPITAL_COMMUNITY)
Admission: EM | Admit: 2019-01-14 | Discharge: 2019-01-15 | Disposition: A | Discharge status: Home / Self Care | Payer: 59 | Attending: Emergency Medicine | Admitting: Emergency Medicine

## 2019-01-14 DIAGNOSIS — K8020 Calculus of gallbladder without cholecystitis without obstruction: Secondary | ICD-10-CM | POA: Insufficient documentation

## 2019-01-14 DIAGNOSIS — Z20828 Contact with and (suspected) exposure to other viral communicable diseases: Secondary | ICD-10-CM | POA: Diagnosis not present

## 2019-01-14 DIAGNOSIS — R1013 Epigastric pain: Secondary | ICD-10-CM | POA: Insufficient documentation

## 2019-01-14 DIAGNOSIS — M25512 Pain in left shoulder: Secondary | ICD-10-CM | POA: Diagnosis not present

## 2019-01-14 DIAGNOSIS — Z79899 Other long term (current) drug therapy: Secondary | ICD-10-CM | POA: Diagnosis not present

## 2019-01-14 DIAGNOSIS — M25511 Pain in right shoulder: Secondary | ICD-10-CM | POA: Diagnosis not present

## 2019-01-14 DIAGNOSIS — R079 Chest pain, unspecified: Secondary | ICD-10-CM | POA: Diagnosis present

## 2019-01-14 DIAGNOSIS — M549 Dorsalgia, unspecified: Secondary | ICD-10-CM | POA: Diagnosis not present

## 2019-01-14 LAB — CBC
HCT: 41 % (ref 36.0–46.0)
Hemoglobin: 13.9 g/dL (ref 12.0–15.0)
MCH: 29.5 pg (ref 26.0–34.0)
MCHC: 33.9 g/dL (ref 30.0–36.0)
MCV: 87 fL (ref 80.0–100.0)
Platelets: 223 10*3/uL (ref 150–400)
RBC: 4.71 MIL/uL (ref 3.87–5.11)
RDW: 12.1 % (ref 11.5–15.5)
WBC: 4.6 10*3/uL (ref 4.0–10.5)
nRBC: 0 % (ref 0.0–0.2)

## 2019-01-14 LAB — COMPREHENSIVE METABOLIC PANEL
ALT: 17 U/L (ref 0–44)
AST: 19 U/L (ref 15–41)
Albumin: 4.3 g/dL (ref 3.5–5.0)
Alkaline Phosphatase: 60 U/L (ref 38–126)
Anion gap: 9 (ref 5–15)
BUN: 8 mg/dL (ref 6–20)
CO2: 26 mmol/L (ref 22–32)
Calcium: 9.3 mg/dL (ref 8.9–10.3)
Chloride: 105 mmol/L (ref 98–111)
Creatinine, Ser: 0.94 mg/dL (ref 0.44–1.00)
GFR calc Af Amer: >60 mL/min (ref >60)
GFR calc non Af Amer: >60 mL/min (ref >60)
Glucose, Bld: 96 mg/dL (ref 70–99)
Potassium: 3.7 mmol/L (ref 3.5–5.1)
Sodium: 140 mmol/L (ref 135–145)
Total Bilirubin: 1 mg/dL (ref 0.3–1.2)
Total Protein: 7.2 g/dL (ref 6.5–8.1)

## 2019-01-14 LAB — TROPONIN I (HIGH SENSITIVITY)
Troponin I (High Sensitivity): <2 ng/L (ref <18)
Troponin I (High Sensitivity): <2 ng/L (ref <18)

## 2019-01-14 MED ORDER — SODIUM CHLORIDE 0.9% FLUSH: 3.0000 mL | Freq: Once | INTRAVENOUS | Status: DC

## 2019-01-14 NOTE — ED Triage Notes (Signed)
Pt awoke out of her sleep last night with sub sternal chest pain and bil shoulder pain.  Pt felt she needed to burp, but was unable.  Pt also felt fatigued in the morning.

## 2019-01-14 NOTE — ED Notes (Signed)
Pt ambulatory to sort nurse stating she is still having pain and started having palpitations. Radial pulse regular, rate normal. Updated/Reassured pt.

## 2019-01-15 ENCOUNTER — Emergency Department (HOSPITAL_COMMUNITY): Payer: 59

## 2019-01-15 DIAGNOSIS — Z79899 Other long term (current) drug therapy: Secondary | ICD-10-CM | POA: Diagnosis not present

## 2019-01-15 DIAGNOSIS — K8020 Calculus of gallbladder without cholecystitis without obstruction: Secondary | ICD-10-CM | POA: Diagnosis not present

## 2019-01-15 DIAGNOSIS — R1013 Epigastric pain: Secondary | ICD-10-CM | POA: Diagnosis not present

## 2019-01-15 DIAGNOSIS — Z20828 Contact with and (suspected) exposure to other viral communicable diseases: Secondary | ICD-10-CM | POA: Diagnosis not present

## 2019-01-15 LAB — LIPASE, BLOOD: Lipase: 39 U/L (ref 11–51)

## 2019-01-15 MED ORDER — METOCLOPRAMIDE HCL 5 MG/ML IJ SOLN
10.0000 mg | Freq: Once | INTRAMUSCULAR | Status: AC
  Administered 2019-01-15: 10 mg via INTRAVENOUS
  Filled 2019-01-15: qty 2

## 2019-01-15 MED ORDER — ONDANSETRON HCL 4 MG/2ML IJ SOLN
4.0000 mg | Freq: Once | INTRAMUSCULAR | Status: AC
  Administered 2019-01-15: 4 mg via INTRAVENOUS
  Filled 2019-01-15: qty 2

## 2019-01-15 MED ORDER — ONDANSETRON 4 MG PO TBDP: 4.0000 mg | ORAL_TABLET | Freq: Three times a day (TID) | ORAL | 0 refills | Status: DC | PRN

## 2019-01-15 MED ORDER — METHYLPREDNISOLONE SODIUM SUCC 125 MG IJ SOLR
125.0000 mg | Freq: Once | INTRAMUSCULAR | Status: AC
  Administered 2019-01-15: 125 mg via INTRAVENOUS
  Filled 2019-01-15: qty 2

## 2019-01-15 MED ORDER — DICYCLOMINE HCL 20 MG PO TABS: 20.0000 mg | ORAL_TABLET | Freq: Two times a day (BID) | ORAL | 0 refills | Status: DC

## 2019-01-15 MED ORDER — OXYCODONE-ACETAMINOPHEN 5-325 MG PO TABS: 1.0000 | ORAL_TABLET | Freq: Four times a day (QID) | ORAL | 0 refills | Status: DC | PRN

## 2019-01-15 MED ORDER — SODIUM CHLORIDE 0.9 % IV BOLUS
1000.0000 mL | Freq: Once | INTRAVENOUS | Status: AC
  Administered 2019-01-15: 1000 mL via INTRAVENOUS

## 2019-01-15 MED ORDER — MORPHINE SULFATE (PF) 4 MG/ML IV SOLN
4.0000 mg | Freq: Once | INTRAVENOUS | Status: AC
  Administered 2019-01-15: 4 mg via INTRAVENOUS
  Filled 2019-01-15: qty 1

## 2019-01-15 MED ORDER — KETOROLAC TROMETHAMINE 30 MG/ML IJ SOLN
15.0000 mg | Freq: Once | INTRAMUSCULAR | Status: AC
  Administered 2019-01-15: 15 mg via INTRAVENOUS
  Filled 2019-01-15: qty 1

## 2019-01-15 NOTE — Discharge Instructions (Addendum)
Biliary colic and symptomatic cholelithiasis  Hand washing: Wash your hands throughout the day, but especially before and after touching the face, using the restroom, sneezing, coughing, or touching surfaces that have been coughed or sneezed upon. Hydration: Symptoms will be intensified and complicated by dehydration. Dehydration can also extend the duration of symptoms. Drink plenty of fluids and get plenty of rest. You should be drinking at least half a liter of water an hour to stay hydrated. Electrolyte drinks (ex. Gatorade, Powerade, Pedialyte) are also encouraged. You should be drinking enough fluids to make your urine light yellow, almost clear. If this is not the case, you are not drinking enough water. Please note that some of the treatments indicated below will not be effective if you are not adequately hydrated. Diet: Please concentrate on hydration, however, you may introduce food slowly.  Start with a clear liquid diet, progressed to a full liquid diet, and then bland solids as you are able. After this, please adhere to the attached gallbladder eating plan. Pain or fever: Ibuprofen, Naproxen, or Tylenol for pain or fever.  Antiinflammatory medications: Take 600 mg of ibuprofen every 6 hours or 440 mg (over the counter dose) to 500 mg (prescription dose) of naproxen every 12 hours for the next 3 days. After this time, these medications may be used as needed for pain. Take these medications with food to avoid upset stomach. Choose only one of these medications, do not take them together. Acetaminophen (generic for Tylenol): Should you continue to have additional pain while taking the ibuprofen or naproxen, you may add in acetaminophen as needed. Your daily total maximum amount of acetaminophen from all sources should be limited to 4000mg /day for persons without liver problems, or 2000mg /day for those with liver problems. Percocet: May take Percocet (oxycodone-acetaminophen) as needed for severe  pain.   Do not drive or perform other dangerous activities while taking this medication as it can cause drowsiness as well as changes in reaction time and judgement.  Please note that each pill of Percocet contains 325 mg of acetaminophen (generic for Tylenol) and the above dosage limits apply. Nausea/vomiting: Use the Zofran for nausea or vomiting. Bentyl: This medication is what is known as an antispasmodic and is intended to help reduce abdominal discomfort. Follow-up: Follow-up with a primary care provider on this matter. Return: Return should you develop a fever, bloody diarrhea, increased abdominal pain, uncontrolled vomiting, or any other major concerns.  For prescription assistance, may try using prescription discount sites or apps, such as goodrx.com

## 2019-01-15 NOTE — ED Provider Notes (Signed)
[facility] DEPART[facility] Provider Note   CSN: [id number] Arrival date & time: 01/14/19  1828    History   Chief Complaint Chief Complaint  Patient presents with  . Chest Pain    HPI Christine Nash is a 48 y.o. female.     HPI   Christine Nash is a 48 y.o. female, with a history of anxiety, cervical spinal stenosis, hyperlipidemia, presenting to the ED with lower chest pain beginning about 3 AM July 8, waking patient from sleep.  This pain begins in the epigastric region of the abdomen and radiates up through the center of the chest, feels like a pressure, waxing and waning, ranging from 4/10 to 8/10.  No changes with deep breathing or exertion.  She has had increased gas and belching over the last 24 hours.  Over the last 24 hours she also began to develop pain and tension in her bilateral shoulders, neck, and into the back of her head, causing a headache.  The symptoms worsened as she became more frustrated while waiting in the waiting room. She has also had only a small amount of water and a small snack in the several hours she was in the waiting room.  Patient denies contact with known or suspected COVID-19 patients.  Denies fever/chills, N/V/D, shortness of breath, cough, hematochezia/melena, dizziness, diaphoresis, vision changes, numbness, weakness, or any other complaints.      History reviewed. No pertinent past medical history.  Patient Active Problem List   Diagnosis Date Noted  . Generalized anxiety disorder 02/11/2018  . Hormone replacement therapy (HRT) 02/11/2018  . Spinal stenosis of cervical region 02/11/2018  . Hyperlipidemia 02/11/2018  . Vitamin D deficiency 02/11/2018  . Attention deficit hyperactivity disorder, predominantly inattentive type 06/27/2011    Past Surgical History:  Procedure Laterality Date  . CERVICAL SPINE SURGERY  2011,2012,2017   ACDF repair w/instrumentation  . CESAREAN SECTION   [id number]  . LAPAROSCOPIC HYSTERECTOMY  2013  . TUBAL LIGATION  2011     OB History   No obstetric history on file.      Home Medications    Prior to Admission medications   Medication Sig Start Date End Date Taking? Authorizing Provider  baclofen (LIORESAL) 20 MG tablet Take 1 tablet (20 mg total) by mouth 3 (three) times daily. 12/10/18   Orma Flaming, MD  busPIRone (BUSPAR) 7.5 MG tablet Take 1 tablet (7.5 mg total) by mouth 3 (three) times daily. 12/10/18   Orma Flaming, MD  dicyclomine (BENTYL) 20 MG tablet Take 1 tablet (20 mg total) by mouth 2 (two) times daily. 01/15/19   ,  C, PA-C  estradiol (ESTRACE) 0.5 MG tablet Take 0.5 mg by mouth daily. 01/10/18   [provider]  Estradiol 10 MCG TABS vaginal tablet INSERT 1 TABLET INTO THE VAGINA TWICE A WEEK 01/20/18   [provider]  Liraglutide -Weight Management (SAXENDA) 18 MG/3ML SOPN Inject 3 mg into the skin daily. 12/15/18   Briscoe Deutscher, DO  ondansetron (ZOFRAN ODT) 4 MG disintegrating tablet Take 1 tablet (4 mg total) by mouth every 8 (eight) hours as needed for nausea or vomiting. 01/15/19   ,  C, PA-C  oxyCODONE-acetaminophen (PERCOCET/ROXICET) 5-325 MG tablet Take 1-2 tablets by mouth every 6 (six) hours as needed for severe pain. 01/15/19   , Helane Gunther, PA-C    Family History Family History  Problem Relation Age of Onset  . Breast cancer Maternal Aunt  Social History Social History   Tobacco Use  . Smoking status: Never Smoker  . Smokeless tobacco: Never Used  Substance Use Topics  . Alcohol use: Not Currently    Frequency: Never  . Drug use: Never     Allergies   Meperidine hcl, Prochlorperazine edisylate, Propranolol, Trokendi xr [topiramate er], and Phentermine   Review of Systems Review of Systems  Constitutional: Negative for chills, diaphoresis and fever.  Eyes: Negative for visual disturbance.  Respiratory: Negative for cough and shortness of breath.    Cardiovascular: Positive for chest pain and palpitations.  Gastrointestinal: Positive for abdominal pain. Negative for blood in stool, diarrhea, nausea and vomiting.  Genitourinary: Negative for dysuria, flank pain and frequency.  Musculoskeletal: Negative for back pain.  Neurological: Positive for headaches. Negative for dizziness, syncope, weakness, light-headedness and numbness.  Psychiatric/Behavioral: Negative for confusion.  All other systems reviewed and are negative.    Physical Exam Updated Vital Signs BP 107/75 (BP Location: Left Arm)   Pulse 79   Temp 97.6 F (36.4 C) (Oral)   Resp 16   Ht 5' 3.5" (1.613 m)   Wt 83 kg   SpO2 100%   BMI 31.91 kg/m   Physical Exam Vitals signs and nursing note reviewed.  Constitutional:      General: She is not in acute distress.    Appearance: She is well-developed. She is not diaphoretic.  HENT:     Head: Normocephalic and atraumatic.     Mouth/Throat:     Mouth: Mucous membranes are moist.     Pharynx: Oropharynx is clear.  Eyes:     Extraocular Movements: Extraocular movements intact.     Conjunctiva/sclera: Conjunctivae normal.     Pupils: Pupils are equal, round, and reactive to light.  Neck:     Musculoskeletal: Normal range of motion and neck supple.  Cardiovascular:     Rate and Rhythm: Normal rate and regular rhythm.     Pulses: Normal pulses.          Radial pulses are 2+ on the right side and 2+ on the left side.       Posterior tibial pulses are 2+ on the right side and 2+ on the left side.     Heart sounds: Normal heart sounds.     Comments: Tactile temperature in the extremities appropriate and equal bilaterally. Pulmonary:     Effort: Pulmonary effort is normal. No respiratory distress.     Breath sounds: Normal breath sounds.  Chest:    Abdominal:     Palpations: Abdomen is soft.     Tenderness: There is abdominal tenderness in the right upper quadrant and epigastric area. There is no guarding.   Musculoskeletal:     Right lower leg: No edema.     Left lower leg: No edema.     Comments: Patient indicates she has pain and tenderness to the entire sternum as well as the bilateral trapezius and cervical musculature.  Normal motor function intact in all extremities. No midline spinal tenderness.   Lymphadenopathy:     Cervical: No cervical adenopathy.  Skin:    General: Skin is warm and dry.  Neurological:     Mental Status: She is alert and oriented to person, place, and time.     Comments: Sensation grossly intact to light touch in the extremities.  Grip strengths equal bilaterally.  Strength 5/5 in all extremities. No gait disturbance. Coordination intact. Cranial nerves III-XII grossly intact. No facial droop.  Psychiatric:        Mood and Affect: Mood and affect normal.        Speech: Speech normal.        Behavior: Behavior normal.      ED Treatments / Results  Labs (all labs ordered are listed, but only abnormal results are displayed) Labs Reviewed  NOVEL CORONAVIRUS, NAA (HOSPITAL ORDER, SEND-OUT TO REF LAB)  CBC  TROPONIN I (HIGH SENSITIVITY)  TROPONIN I (HIGH SENSITIVITY)  COMPREHENSIVE METABOLIC PANEL  LIPASE, BLOOD    EKG EKG Interpretation  Date/Time:  Wednesday January 14 2019 18:33:22 EDT Ventricular Rate:  74 PR Interval:  156 QRS Duration: 88 QT Interval:  402 QTC Calculation: 446 R Axis:   70 Text Interpretation:  Normal sinus rhythm Normal ECG No old tracing to compare Confirmed by Pryor Curia [phone]) on 01/15/2019 6:45:16 AM   Radiology Dg Chest 2 View  Result Date: 01/14/2019 CLINICAL DATA:  Upper back and bilateral shoulder pain. EXAM: CHEST - 2 VIEW COMPARISON:  None. FINDINGS: The cardiomediastinal contours are normal. The lungs are clear. Pulmonary vasculature is normal. No consolidation, pleural effusion, or pneumothorax. No acute osseous abnormalities are seen. Surgical hardware in the lower cervical spine is partially included.  IMPRESSION: Unremarkable radiographs of the chest. Electronically Signed   By: Keith Rake M.D.   On: 01/14/2019 20:10   US Abdomen Limited Ruq  Result Date: 01/15/2019 CLINICAL DATA:  Chest pain and nausea for 1 day. EXAM: ULTRASOUND ABDOMEN LIMITED RIGHT UPPER QUADRANT COMPARISON:  None. FINDINGS: Gallbladder: Multiple mobile gallstones are present. The largest measures 7 mm. Stones are present in the neck of the gallbladder and into the common bile duct. There is no gallbladder wall thickening. No sonographic Percell Miller sign is present. Common bile duct: Diameter: 3.3 mm, within normal limits. Liver: No focal lesion identified. Within normal limits in parenchymal echogenicity. Portal vein is patent on color Doppler imaging with normal direction of blood flow towards the liver. IMPRESSION: 1. Cholelithiasis with multiple mobile shadowing stones extending into the neck of the gallbladder and possibly the common bile duct without obstruction. 2. No gallbladder wall thickening or sonographic Murphy sign to suggest acute cholecystitis. Electronically Signed   By: San Morelle M.D.   On: 01/15/2019 04:41    Procedures Procedures (including critical care time)  Medications Ordered in ED Medications  sodium chloride flush (NS) 0.9 % injection 3 mL (3 mLs Intravenous Not Given 01/15/19 0411)  metoCLOPramide (REGLAN) injection 10 mg (10 mg Intravenous Given 01/15/19 0406)  sodium chloride 0.9 % bolus 1,000 mL (0 mLs Intravenous Stopped 01/15/19 0438)  methylPREDNISolone sodium succinate (SOLU-MEDROL) 125 mg/2 mL injection 125 mg (125 mg Intravenous Given 01/15/19 0406)  ketorolac (TORADOL) 30 MG/ML injection 15 mg (15 mg Intravenous Given 01/15/19 0406)  ondansetron (ZOFRAN) injection 4 mg (4 mg Intravenous Given 01/15/19 0543)  morphine 4 MG/ML injection 4 mg (4 mg Intravenous Given 01/15/19 0543)     Initial Impression / Assessment and Plan / ED Course  I have reviewed the triage vital signs and the  nursing notes.  Pertinent labs & imaging results that were available during my care of the patient were reviewed by me and considered in my medical decision making (see chart for details).  Clinical Course as of Jan 15 644  Thu Jan 15, 2019  0506 Discussed ultrasound results with the patient.  Patient states the medication did not do much for her pain.  One of them made her nauseous.   [  SJ]  [id number] Dr. Leonides Schanz spoke with the patient.  She states she feels much better.  Options for admission and surgery consult versus discharge with surgery follow-up in the office were discussed.  Patient opted for discharge and outpatient follow-up.   [SJ]    Clinical Course User Index [SJ] ,  C, PA-C       Patient presents with epigastric and lower chest pain beginning the morning of July 8. Patient is nontoxic appearing, afebrile, not tachycardic, not tachypneic, not hypotensive, maintains excellent SPO2 on room air, and is in no apparent distress.  Low suspicion for ACS. HEART score is 2, indicating low risk for a cardiac event.  EKG without evidence of acute ischemia or pathologic/symptomatic arrhythmia.  Wells criteria score is 0, indicating low risk for PE.   Dissection was considered, but thought less likely base on: History and description of the pain are not suggestive, patient is not ill-appearing, lack of risk factors, equal bilateral pulses, lack of neurologic deficits, no widened mediastinum on chest x-ray.  Patient had tenderness extending into the epigastric and right upper quadrant regions of the abdomen.  Ultrasound showed cholelithiasis without evidence of cholecystitis.  I have a high suspicion for symptomatic cholelithiasis in this patient.  Discussed admission versus discharge and follow-up. The patient was given instructions for home care as well as return precautions and follow-up instructions. Patient voices understanding of these instructions, accepts the plan, and is comfortable  with discharge.  Findings and plan of care discussed with [facility], DO. Dr. Leonides Schanz personally evaluated and examined this patient.  Vitals:   01/15/19 0500 01/15/19 0530 01/15/19 0600 01/15/19 0630  BP: 112/70 117/68 115/74 117/80  Pulse: 69 79 64 72  Resp: (!) 21 20    Temp:      TempSrc:      SpO2: 98% 96% 94% 95%  Weight:      Height:         Final Clinical Impressions(s) / ED Diagnoses   Final diagnoses:  Epigastric pain  Symptomatic cholelithiasis    ED Discharge Orders         Ordered    oxyCODONE-acetaminophen (PERCOCET/ROXICET) 5-325 MG tablet  Every 6 hours PRN     01/15/19 0628    ondansetron (ZOFRAN ODT) 4 MG disintegrating tablet  Every 8 hours PRN     01/15/19 0628    dicyclomine (BENTYL) 20 MG tablet  2 times daily     01/15/19 0628           Lorayne Bender, PA-C 01/15/19 0648    Ward, Delice Bison, DO 01/15/19 [place], Delice Bison, DO 01/15/19 [phone]

## 2019-01-15 NOTE — ED Notes (Signed)
Patient transported to Ultrasound 

## 2019-01-15 NOTE — ED Notes (Signed)
Pain is getting progressively worse and now having palpitations

## 2019-01-16 ENCOUNTER — Observation Stay (HOSPITAL_COMMUNITY)
Admission: EM | Admit: 2019-01-16 | Discharge: 2019-01-18 | Disposition: A | Discharge status: Home / Self Care | Payer: 59 | Attending: Surgery | Admitting: Surgery

## 2019-01-16 ENCOUNTER — Other Ambulatory Visit: Payer: Self-pay

## 2019-01-16 ENCOUNTER — Other Ambulatory Visit: Payer: Self-pay | Admitting: *Deleted

## 2019-01-16 DIAGNOSIS — K8010 Calculus of gallbladder with chronic cholecystitis without obstruction: Principal | ICD-10-CM | POA: Insufficient documentation

## 2019-01-16 DIAGNOSIS — Z885 Allergy status to narcotic agent status: Secondary | ICD-10-CM | POA: Diagnosis not present

## 2019-01-16 DIAGNOSIS — F411 Generalized anxiety disorder: Secondary | ICD-10-CM | POA: Diagnosis not present

## 2019-01-16 DIAGNOSIS — K810 Acute cholecystitis: Secondary | ICD-10-CM | POA: Diagnosis present

## 2019-01-16 DIAGNOSIS — Z79899 Other long term (current) drug therapy: Secondary | ICD-10-CM | POA: Diagnosis not present

## 2019-01-16 DIAGNOSIS — Z1159 Encounter for screening for other viral diseases: Secondary | ICD-10-CM | POA: Diagnosis not present

## 2019-01-16 DIAGNOSIS — Z888 Allergy status to other drugs, medicaments and biological substances status: Secondary | ICD-10-CM | POA: Insufficient documentation

## 2019-01-16 DIAGNOSIS — R079 Chest pain, unspecified: Secondary | ICD-10-CM | POA: Diagnosis not present

## 2019-01-16 DIAGNOSIS — K8020 Calculus of gallbladder without cholecystitis without obstruction: Secondary | ICD-10-CM

## 2019-01-16 DIAGNOSIS — Z419 Encounter for procedure for purposes other than remedying health state, unspecified: Secondary | ICD-10-CM

## 2019-01-16 DIAGNOSIS — Z20828 Contact with and (suspected) exposure to other viral communicable diseases: Secondary | ICD-10-CM | POA: Diagnosis not present

## 2019-01-16 DIAGNOSIS — K8000 Calculus of gallbladder with acute cholecystitis without obstruction: Secondary | ICD-10-CM | POA: Diagnosis not present

## 2019-01-16 LAB — CBC WITH DIFFERENTIAL/PLATELET
Abs Immature Granulocytes: 0.01 10*3/uL (ref 0.00–0.07)
Basophils Absolute: 0 10*3/uL (ref 0.0–0.1)
Basophils Relative: 0 %
Eosinophils Absolute: 0.1 10*3/uL (ref 0.0–0.5)
Eosinophils Relative: 1 %
HCT: 36.7 % (ref 36.0–46.0)
Hemoglobin: 12.4 g/dL (ref 12.0–15.0)
Immature Granulocytes: 0 %
Lymphocytes Relative: 34 %
Lymphs Abs: 2.3 10*3/uL (ref 0.7–4.0)
MCH: 29.6 pg (ref 26.0–34.0)
MCHC: 33.8 g/dL (ref 30.0–36.0)
MCV: 87.6 fL (ref 80.0–100.0)
Monocytes Absolute: 0.4 10*3/uL (ref 0.1–1.0)
Monocytes Relative: 6 %
Neutro Abs: 4 10*3/uL (ref 1.7–7.7)
Neutrophils Relative %: 59 %
Platelets: 188 10*3/uL (ref 150–400)
RBC: 4.19 MIL/uL (ref 3.87–5.11)
RDW: 12.4 % (ref 11.5–15.5)
WBC: 6.8 10*3/uL (ref 4.0–10.5)
nRBC: 0 % (ref 0.0–0.2)

## 2019-01-16 LAB — COMPREHENSIVE METABOLIC PANEL
ALT: 14 U/L (ref 0–44)
AST: 15 U/L (ref 15–41)
Albumin: 4.1 g/dL (ref 3.5–5.0)
Alkaline Phosphatase: 56 U/L (ref 38–126)
Anion gap: 10 (ref 5–15)
BUN: 14 mg/dL (ref 6–20)
CO2: 24 mmol/L (ref 22–32)
Calcium: 8.9 mg/dL (ref 8.9–10.3)
Chloride: 106 mmol/L (ref 98–111)
Creatinine, Ser: 1.03 mg/dL — ABNORMAL HIGH (ref 0.44–1.00)
GFR calc Af Amer: >60 mL/min (ref >60)
GFR calc non Af Amer: >60 mL/min (ref >60)
Glucose, Bld: 86 mg/dL (ref 70–99)
Potassium: 3 mmol/L — ABNORMAL LOW (ref 3.5–5.1)
Sodium: 140 mmol/L (ref 135–145)
Total Bilirubin: 0.7 mg/dL (ref 0.3–1.2)
Total Protein: 6.9 g/dL (ref 6.5–8.1)

## 2019-01-16 LAB — LIPASE, BLOOD: Lipase: 34 U/L (ref 11–51)

## 2019-01-16 LAB — SARS CORONAVIRUS 2 BY RT PCR (HOSPITAL ORDER, PERFORMED IN ~~LOC~~ HOSPITAL LAB): SARS Coronavirus 2: NEGATIVE

## 2019-01-16 MED ORDER — ONDANSETRON HCL 4 MG/2ML IJ SOLN
4.0000 mg | Freq: Four times a day (QID) | INTRAMUSCULAR | Status: DC | PRN
  Administered 2019-01-16 – 2019-01-17 (×3): 4 mg via INTRAVENOUS
  Filled 2019-01-16 (×3): qty 2

## 2019-01-16 MED ORDER — HYDROMORPHONE HCL 1 MG/ML IJ SOLN
0.5000 mg | INTRAMUSCULAR | Status: DC | PRN
  Administered 2019-01-16 – 2019-01-18 (×7): 1 mg via INTRAVENOUS
  Filled 2019-01-16 (×7): qty 1

## 2019-01-16 MED ORDER — ENOXAPARIN SODIUM 40 MG/0.4ML ~~LOC~~ SOLN: 40.0000 mg | SUBCUTANEOUS | Status: DC

## 2019-01-16 MED ORDER — POLYETHYLENE GLYCOL 3350 17 G PO PACK: 17.0000 g | PACK | Freq: Every day | ORAL | Status: DC | PRN

## 2019-01-16 MED ORDER — PIPERACILLIN-TAZOBACTAM 3.375 G IVPB
3.3750 g | Freq: Three times a day (TID) | INTRAVENOUS | Status: DC
  Administered 2019-01-16 – 2019-01-18 (×6): 3.375 g via INTRAVENOUS
  Filled 2019-01-16 (×6): qty 50

## 2019-01-16 MED ORDER — OXYCODONE HCL 5 MG PO TABS
5.0000 mg | ORAL_TABLET | ORAL | Status: DC | PRN
  Administered 2019-01-16 – 2019-01-18 (×3): 10 mg via ORAL
  Filled 2019-01-16 (×3): qty 2

## 2019-01-16 MED ORDER — ONDANSETRON 4 MG PO TBDP: 4.0000 mg | ORAL_TABLET | Freq: Four times a day (QID) | ORAL | Status: DC | PRN

## 2019-01-16 MED ORDER — KETOROLAC TROMETHAMINE 30 MG/ML IJ SOLN: 30.0000 mg | Freq: Four times a day (QID) | INTRAMUSCULAR | Status: DC | PRN

## 2019-01-16 MED ORDER — MORPHINE SULFATE (PF) 4 MG/ML IV SOLN
4.0000 mg | Freq: Once | INTRAVENOUS | Status: AC
  Administered 2019-01-16: 4 mg via INTRAVENOUS
  Filled 2019-01-16: qty 1

## 2019-01-16 MED ORDER — ONDANSETRON HCL 4 MG/2ML IJ SOLN
4.0000 mg | Freq: Once | INTRAMUSCULAR | Status: AC
  Administered 2019-01-16: 4 mg via INTRAVENOUS
  Filled 2019-01-16: qty 2

## 2019-01-16 MED ORDER — ACETAMINOPHEN 500 MG PO TABS
1000.0000 mg | ORAL_TABLET | Freq: Four times a day (QID) | ORAL | Status: DC
  Administered 2019-01-16 – 2019-01-18 (×5): 1000 mg via ORAL
  Filled 2019-01-16 (×6): qty 2

## 2019-01-16 MED ORDER — SODIUM CHLORIDE 0.9 % IV BOLUS
1000.0000 mL | Freq: Once | INTRAVENOUS | Status: AC
  Administered 2019-01-16: 1000 mL via INTRAVENOUS

## 2019-01-16 MED ORDER — HYDRALAZINE HCL 20 MG/ML IJ SOLN: 10.0000 mg | INTRAMUSCULAR | Status: DC | PRN

## 2019-01-16 MED ORDER — KCL IN DEXTROSE-NACL 20-5-0.45 MEQ/L-%-% IV SOLN
INTRAVENOUS | Status: DC
  Administered 2019-01-16: via INTRAVENOUS
  Filled 2019-01-16: qty 1000

## 2019-01-16 NOTE — ED Provider Notes (Signed)
[facility] DEPT Provider Note   CSN: [id number] Arrival date & time: 01/16/19  1428    History   Chief Complaint Chief Complaint  Patient presents with  . Cholelithiasis    HPI Christine Nash is a 48 y.o. female.     The history is provided by the patient and medical records. No language interpreter was used.   Christine Nash Pain is a 48 y.o. female  with a PMH as listed below who presents to the Emergency Department complaining of persistent right upper quadrant abdominal pain associated with bilateral shoulder pain, belching and nausea.  She was seen in the emergency department yesterday where cardiac work-up was reassuring.  Ultrasound confirmed several gallstones which they thought was the etiology of her pain.  Discussed potential surgical consultation with admission versus discharge, but patient really wanted to go home.  She was prescribed Zofran and Percocet.  She did take Percocet this morning about 1045, but did not feel as if it helped very much.  She still is an 8/10 pain.  She reports feeling unable to control this at home.  She did take Zofran as well and felt as if it helped with the nausea.  She has not had any vomiting at home.  No fevers.  No past medical history on file.  Patient Active Problem List   Diagnosis Date Noted  . Acute cholecystitis 01/16/2019  . Generalized anxiety disorder 02/11/2018  . Hormone replacement therapy (HRT) 02/11/2018  . Spinal stenosis of cervical region 02/11/2018  . Hyperlipidemia 02/11/2018  . Vitamin D deficiency 02/11/2018  . Attention deficit hyperactivity disorder, predominantly inattentive type 06/27/2011    Past Surgical History:  Procedure Laterality Date  . CERVICAL SPINE SURGERY  2011,2012,2017   ACDF repair w/instrumentation  . CESAREAN SECTION  [id number]  . LAPAROSCOPIC HYSTERECTOMY  2013  . TUBAL LIGATION  2011     OB History   No obstetric history on file.       Home Medications    Prior to Admission medications   Medication Sig Start Date End Date Taking? Authorizing Provider  baclofen (LIORESAL) 20 MG tablet Take 1 tablet (20 mg total) by mouth 3 (three) times daily. 12/10/18   Orma Flaming, MD  busPIRone (BUSPAR) 7.5 MG tablet Take 1 tablet (7.5 mg total) by mouth 3 (three) times daily. 12/10/18   Orma Flaming, MD  dicyclomine (BENTYL) 20 MG tablet Take 1 tablet (20 mg total) by mouth 2 (two) times daily. 01/15/19   Joy, Shawn C, PA-C  estradiol (ESTRACE) 0.5 MG tablet Take 0.5 mg by mouth daily. 01/10/18   [provider]  Estradiol 10 MCG TABS vaginal tablet INSERT 1 TABLET INTO THE VAGINA TWICE A WEEK 01/20/18   [provider]  Liraglutide -Weight Management (SAXENDA) 18 MG/3ML SOPN Inject 3 mg into the skin daily. 12/15/18   Briscoe Deutscher, DO  ondansetron (ZOFRAN ODT) 4 MG disintegrating tablet Take 1 tablet (4 mg total) by mouth every 8 (eight) hours as needed for nausea or vomiting. 01/15/19   Joy, Shawn C, PA-C  oxyCODONE-acetaminophen (PERCOCET/ROXICET) 5-325 MG tablet Take 1-2 tablets by mouth every 6 (six) hours as needed for severe pain. 01/15/19   Joy, Helane Gunther, PA-C    Family History Family History  Problem Relation Age of Onset  . Breast cancer Maternal Aunt     Social History Social History   Tobacco Use  . Smoking status: Never Smoker  . Smokeless tobacco: Never Used  Substance Use Topics  . Alcohol use: Not Currently    Frequency: Never  . Drug use: Never     Allergies   Meperidine hcl, Prochlorperazine edisylate, Propranolol, Trokendi xr [topiramate er], and Phentermine   Review of Systems Review of Systems  Constitutional: Negative for fever.  Cardiovascular: Positive for chest pain. Negative for palpitations and leg swelling.  Gastrointestinal: Positive for abdominal pain and nausea. Negative for blood in stool, constipation, diarrhea and vomiting.  All other systems reviewed and are negative.     Physical Exam Updated Vital Signs BP 128/90 (BP Location: Right Arm)   Pulse 75   Temp 97.9 F (36.6 C) (Oral)   SpO2 97%   Physical Exam Vitals signs and nursing note reviewed.  Constitutional:      General: She is not in acute distress.    Appearance: She is well-developed.  HENT:     Head: Normocephalic and atraumatic.  Neck:     Musculoskeletal: Neck supple.  Cardiovascular:     Rate and Rhythm: Normal rate and regular rhythm.     Heart sounds: Normal heart sounds. No murmur.  Pulmonary:     Effort: Pulmonary effort is normal. No respiratory distress.     Breath sounds: Normal breath sounds.  Abdominal:     General: There is no distension.     Palpations: Abdomen is soft.     Comments: Exquisitely tender to the right upper quadrant. + Murphy's.  Skin:    General: Skin is warm and dry.  Neurological:     Mental Status: She is alert and oriented to person, place, and time.      ED Treatments / Results  Labs (all labs ordered are listed, but only abnormal results are displayed) Labs Reviewed  SARS CORONAVIRUS 2 (HOSPITAL ORDER, [place] [facility])  CBC WITH DIFFERENTIAL/PLATELET  COMPREHENSIVE METABOLIC PANEL  LIPASE, BLOOD  CBC  CREATININE, SERUM    EKG EKG Interpretation  Date/Time:  Friday January 16 2019 16:02:06 EDT Ventricular Rate:  63 PR Interval:    QRS Duration: 105 QT Interval:  435 QTC Calculation: 446 R Axis:   66 Text Interpretation:  Sinus rhythm No significant change since last tracing Confirmed by Theotis Burrow [phone]) on 01/16/2019 4:32:40 PM   Radiology Dg Chest 2 View  Result Date: 01/14/2019 CLINICAL DATA:  Upper back and bilateral shoulder pain. EXAM: CHEST - 2 VIEW COMPARISON:  None. FINDINGS: The cardiomediastinal contours are normal. The lungs are clear. Pulmonary vasculature is normal. No consolidation, pleural effusion, or pneumothorax. No acute osseous abnormalities are seen. Surgical hardware in the lower  cervical spine is partially included. IMPRESSION: Unremarkable radiographs of the chest. Electronically Signed   By: Keith Rake M.D.   On: 01/14/2019 20:10   US Abdomen Limited Ruq  Result Date: 01/15/2019 CLINICAL DATA:  Chest pain and nausea for 1 day. EXAM: ULTRASOUND ABDOMEN LIMITED RIGHT UPPER QUADRANT COMPARISON:  None. FINDINGS: Gallbladder: Multiple mobile gallstones are present. The largest measures 7 mm. Stones are present in the neck of the gallbladder and into the common bile duct. There is no gallbladder wall thickening. No sonographic Percell Miller sign is present. Common bile duct: Diameter: 3.3 mm, within normal limits. Liver: No focal lesion identified. Within normal limits in parenchymal echogenicity. Portal vein is patent on color Doppler imaging with normal direction of blood flow towards the liver. IMPRESSION: 1. Cholelithiasis with multiple mobile shadowing stones extending into the neck of the gallbladder and possibly the common bile  duct without obstruction. 2. No gallbladder wall thickening or sonographic Murphy sign to suggest acute cholecystitis. Electronically Signed   By: San Morelle M.D.   On: 01/15/2019 04:41    Procedures Procedures (including critical care time)  Medications Ordered in ED Medications  enoxaparin (LOVENOX) injection 40 mg (has no administration in time range)  dextrose 5 % and 0.45 % NaCl with KCl 20 mEq/L infusion (has no administration in time range)  piperacillin-tazobactam (ZOSYN) IVPB 3.375 g (has no administration in time range)  acetaminophen (TYLENOL) tablet 1,000 mg (has no administration in time range)  ketorolac (TORADOL) 30 MG/ML injection 30 mg (has no administration in time range)  oxyCODONE (Oxy IR/ROXICODONE) immediate release tablet 5-10 mg (has no administration in time range)  HYDROmorphone (DILAUDID) injection 0.5-1 mg (has no administration in time range)  polyethylene glycol (MIRALAX / GLYCOLAX) packet 17 g (has no  administration in time range)  ondansetron (ZOFRAN-ODT) disintegrating tablet 4 mg (has no administration in time range)    Or  ondansetron (ZOFRAN) injection 4 mg (has no administration in time range)  hydrALAZINE (APRESOLINE) injection 10 mg (has no administration in time range)  sodium chloride 0.9 % bolus 1,000 mL (1,000 mLs Intravenous New Bag/Given 01/16/19 1557)  ondansetron (ZOFRAN) injection 4 mg (4 mg Intravenous Given 01/16/19 1554)  morphine 4 MG/ML injection 4 mg (4 mg Intravenous Given 01/16/19 1555)     Initial Impression / Assessment and Plan / ED Course  I have reviewed the triage vital signs and the nursing notes.  Pertinent labs & imaging results that were available during my care of the patient were reviewed by me and considered in my medical decision making (see chart for details).       Stephene Alegria is a 48 y.o. female who presents to ED for right upper abdominal pain, bilateral shoulder pain and chest pain. Seen at [facility] late on the 8th / early yesterday am for same.  Had thorough cardiac evaluation for chest pain at that point.  EKG today is unchanged.  Doubt ACS.  Per chart review, patient with ultrasound yesterday showing cholelithiasis, but no gallbladder wall thickening or sonographic Murphy sign.  At ED visit yesterday, shared decision making of surgical consult with admission versus home treatment was discussed and patient wanted to go home.  She reports having poor pain control at home which is what prompted her to return to the emergency department.  She is exquisitely tender to the right upper quadrant.  Concerned that she may indeed have symptomatic cholelithiasis versus acute cholecystitis.  Will repeat labs and discuss with surgery, anticipate admission.  4:14 PM -discussed case with general surgery who will evaluate.   General surgery to admit.   Patient discussed with Dr. Rex Kras who agrees with treatment plan.    Final Clinical Impressions(s) /  ED Diagnoses   Final diagnoses:  Acute cholecystitis    ED Discharge Orders    None       Jayleen Afonso, Ozella Almond, PA-C 01/16/19 1658    Little, Wenda Overland, MD 01/25/19 2034

## 2019-01-16 NOTE — ED Triage Notes (Signed)
C/o central chest pain, bilateral shoulder pain, and dizziness, and burping.   Patient diagnosed with multiple Gall stones.  Told to follow up central [place] surgery.   Patient states she went [place] Wednesday night for crushing chest pain, shoulder pain, and headache. [facility] ruled out heart attack and all other labs were fine and that's when they diagosed her with gall stones.     Patient took 2 percocet and zofran around 10:45 that is now having break through pain.   8/10 dull achy pain with waves of nausea.   Patient was prescribed percocet, bently, and zofran.    A/Ox4 Ambulatory in triage.

## 2019-01-16 NOTE — ED Notes (Signed)
ED TO INPATIENT HANDOFF REPORT  ED Nurse Name and Phone #: Noni Saupe Name/Age/Gender Christine Nash Nordstrom 48 y.o. female Room/Bed: WA06/WA06  Code Status   Code Status: Full Code  Home/SNF/Other Given to floor Patient oriented to: self, place, time and situation Is this baseline? Yes   Triage Complete: Triage complete  Chief Complaint gb pain  Triage Note C/o central chest pain, bilateral shoulder pain, and dizziness, and burping.   Patient diagnosed with multiple Gall stones.  Told to follow up central  surgery.   Patient states she went [place] Wednesday night for crushing chest pain, shoulder pain, and headache. [facility] ruled out heart attack and all other labs were fine and that's when they diagosed her with gall stones.     Patient took 2 percocet and zofran around 10:45 that is now having break through pain.   8/10 dull achy pain with waves of nausea.   Patient was prescribed percocet, bently, and zofran.    A/Ox4 Ambulatory in triage.       Allergies Allergies  Allergen Reactions  . Meperidine Hcl Hives  . Prochlorperazine Edisylate     Other reaction(s): agitiation  . Propranolol     "brain fog" fatigue   . Trokendi [employer]] Other (See Comments)    "brain fog"   . Phentermine Palpitations    Chest pain / palpitations     Level of Care/Admitting Diagnosis ED Disposition    ED Disposition Condition Comment   Admit  Hospital Area: [facility] [[id number]]  Level of Care: Med-Surg [16]  Covid Evaluation: Asymptomatic Screening Protocol (No Symptoms)  Diagnosis: Acute cholecystitis [575.0.ICD-9-CM]  Admitting Physician: Ileana Roup [[id number]]  Attending Physician: [facility], [place]  Bed request comments: 5W  PT Class (Do Not Modify): Observation [104]  PT Acc Code (Do Not Modify): Observation [10022]       B Medical/Surgery History No past medical history on file. Past Surgical History:   Procedure Laterality Date  . CERVICAL SPINE SURGERY  2011,2012,2017   ACDF repair w/instrumentation  . CESAREAN SECTION  [id number]  . LAPAROSCOPIC HYSTERECTOMY  2013  . TUBAL LIGATION  2011     A IV Location/Drains/Wounds Patient Lines/Drains/Airways Status   Active Line/Drains/Airways    Name:   Placement date:   Placement time:   Site:   Days:   Peripheral IV 01/16/19 Left Forearm   01/16/19    1547    Forearm   less than 1          Intake/Output Last 24 hours No intake or output data in the 24 hours ending 01/16/19 1835  Labs/Imaging Results for orders placed or performed during the hospital encounter of 01/16/19 (from the past 48 hour(s))  CBC with Differential     Status: None   Collection Time: 01/16/19  3:58 PM  Result Value Ref Range   WBC 6.8 4.0 - 10.5 K/uL   RBC 4.19 3.87 - 5.11 MIL/uL   Hemoglobin 12.4 12.0 - 15.0 g/dL   HCT 36.7 36.0 - 46.0 %   MCV 87.6 80.0 - 100.0 fL   MCH 29.6 26.0 - 34.0 pg   MCHC 33.8 30.0 - 36.0 g/dL   RDW 12.4 11.5 - 15.5 %   Platelets 188 150 - 400 K/uL   nRBC 0.0 0.0 - 0.2 %   Neutrophils Relative % 59 %   Neutro Abs 4.0 1.7 - 7.7 K/uL   Lymphocytes Relative 34 %  Lymphs Abs 2.3 0.7 - 4.0 K/uL   Monocytes Relative 6 %   Monocytes Absolute 0.4 0.1 - 1.0 K/uL   Eosinophils Relative 1 %   Eosinophils Absolute 0.1 0.0 - 0.5 K/uL   Basophils Relative 0 %   Basophils Absolute 0.0 0.0 - 0.1 K/uL   Immature Granulocytes 0 %   Abs Immature Granulocytes 0.01 0.00 - 0.07 K/uL    Comment: Performed at [facility], [place] [address] [place], [place] [address]  Comprehensive metabolic panel     Status: Abnormal   Collection Time: 01/16/19  3:58 PM  Result Value Ref Range   Sodium 140 135 - 145 mmol/L   Potassium 3.0 (L) 3.5 - 5.1 mmol/L   Chloride 106 98 - 111 mmol/L   CO2 24 22 - 32 mmol/L   Glucose, Bld 86 70 - 99 mg/dL   BUN 14 6 - 20 mg/dL   Creatinine, Ser 1.03 (H) 0.44 - 1.00 mg/dL   Calcium 8.9 8.9 - 10.3  mg/dL   Total Protein 6.9 6.5 - 8.1 g/dL   Albumin 4.1 3.5 - 5.0 g/dL   AST 15 15 - 41 U/L   ALT 14 0 - 44 U/L   Alkaline Phosphatase 56 38 - 126 U/L   Total Bilirubin 0.7 0.3 - 1.2 mg/dL   GFR calc non Af Amer >60 >60 mL/min   GFR calc Af Amer >60 >60 mL/min   Anion gap 10 5 - 15    Comment: Performed at [facility], [place] [address] [place], [place] [address]  Lipase, blood     Status: None   Collection Time: 01/16/19  3:58 PM  Result Value Ref Range   Lipase 34 11 - 51 U/L    Comment: Performed at [facility], [place] [address], [place], [place] [address]   Dg Chest 2 View  Result Date: 01/14/2019 CLINICAL DATA:  Upper back and bilateral shoulder pain. EXAM: CHEST - 2 VIEW COMPARISON:  None. FINDINGS: The cardiomediastinal contours are normal. The lungs are clear. Pulmonary vasculature is normal. No consolidation, pleural effusion, or pneumothorax. No acute osseous abnormalities are seen. Surgical hardware in the lower cervical spine is partially included. IMPRESSION: Unremarkable radiographs of the chest. Electronically Signed   By: Keith Rake M.D.   On: 01/14/2019 20:10   US Abdomen Limited Ruq  Result Date: 01/15/2019 CLINICAL DATA:  Chest pain and nausea for 1 day. EXAM: ULTRASOUND ABDOMEN LIMITED RIGHT UPPER QUADRANT COMPARISON:  None. FINDINGS: Gallbladder: Multiple mobile gallstones are present. The largest measures 7 mm. Stones are present in the neck of the gallbladder and into the common bile duct. There is no gallbladder wall thickening. No sonographic Percell Miller sign is present. Common bile duct: Diameter: 3.3 mm, within normal limits. Liver: No focal lesion identified. Within normal limits in parenchymal echogenicity. Portal vein is patent on color Doppler imaging with normal direction of blood flow towards the liver. IMPRESSION: 1. Cholelithiasis with multiple mobile shadowing stones extending into the neck of the gallbladder and possibly  the common bile duct without obstruction. 2. No gallbladder wall thickening or sonographic Murphy sign to suggest acute cholecystitis. Electronically Signed   By: San Morelle M.D.   On: 01/15/2019 04:41    Pending Labs Unresulted Labs (From admission, onward)    Start     Ordered   01/23/19 0500  Creatinine, serum  (enoxaparin (LOVENOX)    CrCl >/= 30 ml/min)  Weekly,   R  Comments: while on enoxaparin therapy    01/16/19 1649   01/17/19 0500  Comprehensive metabolic panel  Tomorrow morning,   R     01/16/19 1649   01/17/19 0500  CBC  Tomorrow morning,   R     01/16/19 1649   01/16/19 1650  SARS Coronavirus 2 (CEPHEID - Performed in [facility] hospital lab), [facility] Order  Once,   STAT    Question:  Rule Out  Answer:  Yes   01/16/19 1649   01/16/19 1643  CBC  (enoxaparin (LOVENOX)    CrCl >/= 30 ml/min)  Once,   STAT    Comments: Baseline for enoxaparin therapy IF NOT ALREADY DRAWN.  Notify MD if PLT < 100 K.    01/16/19 1649   01/16/19 1643  Creatinine, serum  (enoxaparin (LOVENOX)    CrCl >/= 30 ml/min)  Once,   STAT    Comments: Baseline for enoxaparin therapy IF NOT ALREADY DRAWN.    01/16/19 1649          Vitals/Pain Today's Vitals   01/16/19 1444 01/16/19 1545 01/16/19 1715 01/16/19 1717  BP:   127/76   Pulse:   65   Resp:   16   Temp:      TempSrc:      SpO2:   100%   PainSc: 8  9   7      Isolation Precautions No active isolations  Medications Medications  enoxaparin (LOVENOX) injection 40 mg (has no administration in time range)  dextrose 5 % and 0.45 % NaCl with KCl 20 mEq/L infusion (has no administration in time range)  piperacillin-tazobactam (ZOSYN) IVPB 3.375 g (3.375 g Intravenous New Bag/Given 01/16/19 1717)  acetaminophen (TYLENOL) tablet 1,000 mg (1,000 mg Oral Given 01/16/19 1716)  ketorolac (TORADOL) 30 MG/ML injection 30 mg (has no administration in time range)  oxyCODONE (Oxy IR/ROXICODONE) immediate release tablet 5-10 mg (has no  administration in time range)  HYDROmorphone (DILAUDID) injection 0.5-1 mg (has no administration in time range)  polyethylene glycol (MIRALAX / GLYCOLAX) packet 17 g (has no administration in time range)  ondansetron (ZOFRAN-ODT) disintegrating tablet 4 mg (has no administration in time range)    Or  ondansetron (ZOFRAN) injection 4 mg (has no administration in time range)  hydrALAZINE (APRESOLINE) injection 10 mg (has no administration in time range)  sodium chloride 0.9 % bolus 1,000 mL (1,000 mLs Intravenous New Bag/Given 01/16/19 1557)  ondansetron (ZOFRAN) injection 4 mg (4 mg Intravenous Given 01/16/19 1554)  morphine 4 MG/ML injection 4 mg (4 mg Intravenous Given 01/16/19 1555)    Mobility walks Low fall risk   Focused Assessments Abd   R Recommendations: See Admitting Provider Note  Report given to:   Additional Notes:

## 2019-01-16 NOTE — H&P (Signed)
[facility] Admission Note  Christine Nash 1970/08/02  [id number].    Requesting MD: Little Chief Complaint/Reason for Consult: RUQ pain HPI:  Patient is an otherwise healthy 48 year old female who presents today with RUQ pain since 3 AM this morning. Was seen in [facility] [place] ED yesterday morning around 3AM for pain that woke her up Wednesday morning, found to have cholelithiasis without signs of cholecystitis on imaging or labs. Patient reports pain woke her up again this AM, but has been more severe than previous episode. Pain is 8/10, sharp and in epigastrium and RUQ, radiates to chest/back/shoulders. Patient reports associated nausea without emesis. Denies fever, chills, SOB, cough, diarrhea or constipation, urinary symptoms. Past abdominal surgeries include cesarean x2, laparoscopic hysterectomy, diagnostic laparoscopy and tubal ligation. Not on any blood thinning medications. Patient does report a history of PONV. Not allergic to any antibiotics that she knows of. Denies alcohol, illicit drug or tobacco use. 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   ROS: Review of Systems  Constitutional: Negative for chills and fever.  Respiratory: Negative for cough, shortness of breath and wheezing.   Cardiovascular: Positive for chest pain. Negative for palpitations.  Gastrointestinal: Positive for abdominal pain and nausea. Negative for blood in stool, constipation, diarrhea, melena and vomiting.  Genitourinary: Negative for dysuria, frequency and urgency.  All other systems reviewed and are negative.   Family History  Problem Relation Age of Onset  . Breast cancer Maternal Aunt     No past medical history on file.  Past Surgical History:  Procedure Laterality Date  . CERVICAL SPINE SURGERY  2011,2012,2017   ACDF repair w/instrumentation  . CESAREAN SECTION  [id number]  . LAPAROSCOPIC HYSTERECTOMY  2013  . TUBAL LIGATION  2011    Social History:  reports that she has never  smoked. She has never used smokeless tobacco. She reports previous alcohol use. She reports that she does not use drugs.  Allergies:  Allergies  Allergen Reactions  . Meperidine Hcl Hives  . Prochlorperazine Edisylate     Other reaction(s): agitiation  . Propranolol     "brain fog" fatigue   . Trokendi Kellogg Er] Other (See Comments)    "brain fog"   . Phentermine Palpitations    Chest pain / palpitations     (Not in a hospital admission)   Blood pressure 128/90, pulse 75, temperature 97.9 F (36.6 C), temperature source Oral, SpO2 97 %. Physical Exam: Physical Exam Constitutional:      General: She is not in acute distress.    Appearance: She is well-developed and overweight. She is not toxic-appearing.  HENT:     Head: Normocephalic and atraumatic.     Right Ear: External ear normal.     Left Ear: External ear normal.     Nose:     Comments: Mask covering nose and mouth Eyes:     General: Lids are normal. No scleral icterus.    Extraocular Movements: Extraocular movements intact.     Conjunctiva/sclera: Conjunctivae normal.     Comments: Pupils equal and round  Neck:     Musculoskeletal: Normal range of motion and neck supple.  Cardiovascular:     Rate and Rhythm: Normal rate and regular rhythm.     Pulses:          Radial pulses are 2+ on the right side and 2+ on the left side.       Dorsalis pedis pulses are 2+ on the right side  and 2+ on the left side.     Heart sounds: No murmur. No friction rub. No gallop.   Pulmonary:     Effort: Pulmonary effort is normal.     Breath sounds: Normal breath sounds.  Abdominal:     General: Abdomen is flat. Bowel sounds are normal. There is no distension.     Palpations: Abdomen is soft.     Tenderness: There is abdominal tenderness in the right upper quadrant and epigastric area. There is no guarding or rebound. Positive signs include Murphy's sign.     Hernia: No hernia is present.  Musculoskeletal:     Comments:  ROM grossly intact in all 4 extremities  Skin:    General: Skin is warm and dry.     Coloration: Skin is not jaundiced.     Findings: No rash.  Neurological:     Mental Status: She is alert and oriented to person, place, and time.  Psychiatric:        Attention and Perception: Attention and perception normal.        Mood and Affect: Mood and affect normal.        Speech: Speech normal.        Behavior: Behavior normal. Behavior is cooperative.     Results for orders placed or performed during the hospital encounter of 01/16/19 (from the past 48 hour(s))  CBC with Differential     Status: None   Collection Time: 01/16/19  3:58 PM  Result Value Ref Range   WBC 6.8 4.0 - 10.5 K/uL   RBC 4.19 3.87 - 5.11 MIL/uL   Hemoglobin 12.4 12.0 - 15.0 g/dL   HCT 36.7 36.0 - 46.0 %   MCV 87.6 80.0 - 100.0 fL   MCH 29.6 26.0 - 34.0 pg   MCHC 33.8 30.0 - 36.0 g/dL   RDW 12.4 11.5 - 15.5 %   Platelets 188 150 - 400 K/uL   nRBC 0.0 0.0 - 0.2 %   Neutrophils Relative % 59 %   Neutro Abs 4.0 1.7 - 7.7 K/uL   Lymphocytes Relative 34 %   Lymphs Abs 2.3 0.7 - 4.0 K/uL   Monocytes Relative 6 %   Monocytes Absolute 0.4 0.1 - 1.0 K/uL   Eosinophils Relative 1 %   Eosinophils Absolute 0.1 0.0 - 0.5 K/uL   Basophils Relative 0 %   Basophils Absolute 0.0 0.0 - 0.1 K/uL   Immature Granulocytes 0 %   Abs Immature Granulocytes 0.01 0.00 - 0.07 K/uL    Comment: Performed at [facility], [place] [address] [place], [place] [address]   Dg Chest 2 View  Result Date: 01/14/2019 CLINICAL DATA:  Upper back and bilateral shoulder pain. EXAM: CHEST - 2 VIEW COMPARISON:  None. FINDINGS: The cardiomediastinal contours are normal. The lungs are clear. Pulmonary vasculature is normal. No consolidation, pleural effusion, or pneumothorax. No acute osseous abnormalities are seen. Surgical hardware in the lower cervical spine is partially included. IMPRESSION: Unremarkable radiographs of the chest.  Electronically Signed   By: Keith Rake M.D.   On: 01/14/2019 20:10   US Abdomen Limited Ruq  Result Date: 01/15/2019 CLINICAL DATA:  Chest pain and nausea for 1 day. EXAM: ULTRASOUND ABDOMEN LIMITED RIGHT UPPER QUADRANT COMPARISON:  None. FINDINGS: Gallbladder: Multiple mobile gallstones are present. The largest measures 7 mm. Stones are present in the neck of the gallbladder and into the common bile duct. There is no gallbladder wall thickening. No sonographic Murphy sign  is present. Common bile duct: Diameter: 3.3 mm, within normal limits. Liver: No focal lesion identified. Within normal limits in parenchymal echogenicity. Portal vein is patent on color Doppler imaging with normal direction of blood flow towards the liver. IMPRESSION: 1. Cholelithiasis with multiple mobile shadowing stones extending into the neck of the gallbladder and possibly the common bile duct without obstruction. 2. No gallbladder wall thickening or sonographic Murphy sign to suggest acute cholecystitis. Electronically Signed   By: San Morelle M.D.   On: 01/15/2019 04:41      Assessment/Plan Acute cholecystitis - admit to observation - ok to have CLD tonight but NPO after midnight - LFTs pending, recheck in AM as well - IV abx, pain control, prn antiemetics - plan for lap chole, likely tomorrow  Brigid Re, [facility] 01/16/2019, 4:50 PM Pager: [phone] Consults: [phone]

## 2019-01-17 ENCOUNTER — Observation Stay (HOSPITAL_COMMUNITY): Payer: 59 | Admitting: Certified Registered"

## 2019-01-17 ENCOUNTER — Encounter (HOSPITAL_COMMUNITY)
Admission: EM | Disposition: A | Discharge status: Home / Self Care | Payer: Self-pay | Source: Home / Self Care | Attending: Emergency Medicine

## 2019-01-17 ENCOUNTER — Observation Stay (HOSPITAL_COMMUNITY): Payer: 59

## 2019-01-17 DIAGNOSIS — E785 Hyperlipidemia, unspecified: Secondary | ICD-10-CM | POA: Diagnosis not present

## 2019-01-17 DIAGNOSIS — D649 Anemia, unspecified: Secondary | ICD-10-CM | POA: Diagnosis not present

## 2019-01-17 DIAGNOSIS — F411 Generalized anxiety disorder: Secondary | ICD-10-CM | POA: Diagnosis not present

## 2019-01-17 DIAGNOSIS — Z885 Allergy status to narcotic agent status: Secondary | ICD-10-CM | POA: Diagnosis not present

## 2019-01-17 DIAGNOSIS — Z79899 Other long term (current) drug therapy: Secondary | ICD-10-CM | POA: Diagnosis not present

## 2019-01-17 DIAGNOSIS — K8000 Calculus of gallbladder with acute cholecystitis without obstruction: Secondary | ICD-10-CM | POA: Diagnosis not present

## 2019-01-17 DIAGNOSIS — F419 Anxiety disorder, unspecified: Secondary | ICD-10-CM | POA: Diagnosis not present

## 2019-01-17 DIAGNOSIS — Z888 Allergy status to other drugs, medicaments and biological substances status: Secondary | ICD-10-CM | POA: Diagnosis not present

## 2019-01-17 DIAGNOSIS — K8020 Calculus of gallbladder without cholecystitis without obstruction: Secondary | ICD-10-CM | POA: Diagnosis not present

## 2019-01-17 DIAGNOSIS — Z1159 Encounter for screening for other viral diseases: Secondary | ICD-10-CM | POA: Diagnosis not present

## 2019-01-17 DIAGNOSIS — K8010 Calculus of gallbladder with chronic cholecystitis without obstruction: Secondary | ICD-10-CM | POA: Diagnosis not present

## 2019-01-17 HISTORY — PX: CHOLECYSTECTOMY: SHX55

## 2019-01-17 LAB — CBC
HCT: 33.4 % — ABNORMAL LOW (ref 36.0–46.0)
Hemoglobin: 11.1 g/dL — ABNORMAL LOW (ref 12.0–15.0)
MCH: 30 pg (ref 26.0–34.0)
MCHC: 33.2 g/dL (ref 30.0–36.0)
MCV: 90.3 fL (ref 80.0–100.0)
Platelets: 164 10*3/uL (ref 150–400)
RBC: 3.7 MIL/uL — ABNORMAL LOW (ref 3.87–5.11)
RDW: 12.6 % (ref 11.5–15.5)
WBC: 4.7 10*3/uL (ref 4.0–10.5)
nRBC: 0 % (ref 0.0–0.2)

## 2019-01-17 LAB — COMPREHENSIVE METABOLIC PANEL
ALT: 13 U/L (ref 0–44)
AST: 15 U/L (ref 15–41)
Albumin: 3.6 g/dL (ref 3.5–5.0)
Alkaline Phosphatase: 49 U/L (ref 38–126)
Anion gap: 7 (ref 5–15)
BUN: 12 mg/dL (ref 6–20)
CO2: 25 mmol/L (ref 22–32)
Calcium: 8.1 mg/dL — ABNORMAL LOW (ref 8.9–10.3)
Chloride: 110 mmol/L (ref 98–111)
Creatinine, Ser: 0.98 mg/dL (ref 0.44–1.00)
GFR calc Af Amer: >60 mL/min (ref >60)
GFR calc non Af Amer: >60 mL/min (ref >60)
Glucose, Bld: 89 mg/dL (ref 70–99)
Potassium: 3.8 mmol/L (ref 3.5–5.1)
Sodium: 142 mmol/L (ref 135–145)
Total Bilirubin: 0.7 mg/dL (ref 0.3–1.2)
Total Protein: 6 g/dL — ABNORMAL LOW (ref 6.5–8.1)

## 2019-01-17 LAB — NOVEL CORONAVIRUS, NAA (HOSP ORDER, SEND-OUT TO REF LAB; TAT 18-24 HRS): SARS-CoV-2, NAA: NOT DETECTED

## 2019-01-17 LAB — SURGICAL PCR SCREEN
MRSA, PCR: NEGATIVE
Staphylococcus aureus: NEGATIVE

## 2019-01-17 SURGERY — LAPAROSCOPIC CHOLECYSTECTOMY WITH INTRAOPERATIVE CHOLANGIOGRAM
Anesthesia: General | Site: Abdomen

## 2019-01-17 MED ORDER — SODIUM CHLORIDE 0.9 % IV SOLN
INTRAVENOUS | Status: DC | PRN
  Administered 2019-01-17: 14 mL

## 2019-01-17 MED ORDER — MUPIROCIN 2 % EX OINT
1.0000 "application " | TOPICAL_OINTMENT | Freq: Two times a day (BID) | CUTANEOUS | Status: DC
  Administered 2019-01-17 – 2019-01-18 (×3): 1 via NASAL
  Filled 2019-01-17 (×2): qty 22

## 2019-01-17 MED ORDER — LACTATED RINGERS IV SOLN
INTRAVENOUS | Status: AC | PRN
  Administered 2019-01-17: 1000 mL

## 2019-01-17 MED ORDER — LACTATED RINGERS IV SOLN
INTRAVENOUS | Status: DC | PRN
  Administered 2019-01-17: 13:00:00 via INTRAVENOUS

## 2019-01-17 MED ORDER — MIDAZOLAM HCL 2 MG/2ML IJ SOLN
INTRAMUSCULAR | Status: AC
  Filled 2019-01-17: qty 2

## 2019-01-17 MED ORDER — BUSPIRONE HCL 5 MG PO TABS
7.5000 mg | ORAL_TABLET | Freq: Two times a day (BID) | ORAL | Status: DC
  Administered 2019-01-17 – 2019-01-18 (×2): 7.5 mg via ORAL
  Filled 2019-01-17 (×2): qty 2

## 2019-01-17 MED ORDER — PROMETHAZINE HCL 25 MG/ML IJ SOLN
INTRAMUSCULAR | Status: AC
  Filled 2019-01-17: qty 1

## 2019-01-17 MED ORDER — BUPIVACAINE-EPINEPHRINE (PF) 0.25% -1:200000 IJ SOLN
INTRAMUSCULAR | Status: AC
  Filled 2019-01-17: qty 30

## 2019-01-17 MED ORDER — EPHEDRINE 5 MG/ML INJ
INTRAVENOUS | Status: AC
  Filled 2019-01-17: qty 10

## 2019-01-17 MED ORDER — SUCCINYLCHOLINE CHLORIDE 200 MG/10ML IV SOSY
PREFILLED_SYRINGE | INTRAVENOUS | Status: DC | PRN
  Administered 2019-01-17: 100 mg via INTRAVENOUS

## 2019-01-17 MED ORDER — KETOROLAC TROMETHAMINE 30 MG/ML IJ SOLN
30.0000 mg | Freq: Once | INTRAMUSCULAR | Status: AC | PRN
  Administered 2019-01-17: 30 mg via INTRAVENOUS

## 2019-01-17 MED ORDER — MORPHINE SULFATE (PF) 4 MG/ML IV SOLN
1.0000 mg | INTRAVENOUS | Status: DC | PRN
  Administered 2019-01-17: 2 mg via INTRAVENOUS
  Filled 2019-01-17: qty 1

## 2019-01-17 MED ORDER — KETOROLAC TROMETHAMINE 30 MG/ML IJ SOLN
INTRAMUSCULAR | Status: AC
  Filled 2019-01-17: qty 1

## 2019-01-17 MED ORDER — TRAMADOL HCL 50 MG PO TABS
100.0000 mg | ORAL_TABLET | Freq: Three times a day (TID) | ORAL | Status: DC | PRN
  Administered 2019-01-18: 100 mg via ORAL
  Filled 2019-01-17: qty 2

## 2019-01-17 MED ORDER — HYDROMORPHONE HCL 1 MG/ML IJ SOLN
0.2500 mg | INTRAMUSCULAR | Status: DC | PRN
  Administered 2019-01-17: 0.25 mg via INTRAVENOUS
  Administered 2019-01-17: 14:00:00 0.5 mg via INTRAVENOUS
  Administered 2019-01-17: 0.25 mg via INTRAVENOUS
  Administered 2019-01-17 (×2): 0.5 mg via INTRAVENOUS

## 2019-01-17 MED ORDER — LACTATED RINGERS IV SOLN: INTRAVENOUS | Status: DC

## 2019-01-17 MED ORDER — ONDANSETRON HCL 4 MG/2ML IJ SOLN
INTRAMUSCULAR | Status: DC | PRN
  Administered 2019-01-17: 4 mg via INTRAVENOUS

## 2019-01-17 MED ORDER — EPHEDRINE SULFATE-NACL 50-0.9 MG/10ML-% IV SOSY
PREFILLED_SYRINGE | INTRAVENOUS | Status: DC | PRN
  Administered 2019-01-17: 10 mg via INTRAVENOUS

## 2019-01-17 MED ORDER — FENTANYL CITRATE (PF) 100 MCG/2ML IJ SOLN
INTRAMUSCULAR | Status: DC | PRN
  Administered 2019-01-17: 100 ug via INTRAVENOUS

## 2019-01-17 MED ORDER — MIDAZOLAM HCL 2 MG/2ML IJ SOLN
INTRAMUSCULAR | Status: DC | PRN
  Administered 2019-01-17: 2 mg via INTRAVENOUS

## 2019-01-17 MED ORDER — PROPOFOL 10 MG/ML IV BOLUS
INTRAVENOUS | Status: DC | PRN
  Administered 2019-01-17: 200 mg via INTRAVENOUS

## 2019-01-17 MED ORDER — DIPHENHYDRAMINE HCL 50 MG/ML IJ SOLN
12.5000 mg | INTRAMUSCULAR | Status: DC | PRN
  Administered 2019-01-17: 12.5 mg via INTRAVENOUS

## 2019-01-17 MED ORDER — DEXAMETHASONE SODIUM PHOSPHATE 10 MG/ML IJ SOLN
INTRAMUSCULAR | Status: DC | PRN
  Administered 2019-01-17: 8 mg via INTRAVENOUS

## 2019-01-17 MED ORDER — LIDOCAINE 2% (20 MG/ML) 5 ML SYRINGE
INTRAMUSCULAR | Status: DC | PRN
  Administered 2019-01-17: 100 mg via INTRAVENOUS

## 2019-01-17 MED ORDER — LIDOCAINE 2% (20 MG/ML) 5 ML SYRINGE
INTRAMUSCULAR | Status: AC
  Filled 2019-01-17: qty 5

## 2019-01-17 MED ORDER — HYDROCODONE-ACETAMINOPHEN 5-325 MG PO TABS
1.0000 | ORAL_TABLET | ORAL | Status: DC | PRN
  Administered 2019-01-17: 2 via ORAL
  Filled 2019-01-17: qty 2

## 2019-01-17 MED ORDER — FENTANYL CITRATE (PF) 100 MCG/2ML IJ SOLN
INTRAMUSCULAR | Status: AC
  Filled 2019-01-17: qty 2

## 2019-01-17 MED ORDER — HYDROMORPHONE HCL 1 MG/ML IJ SOLN
INTRAMUSCULAR | Status: AC
  Filled 2019-01-17: qty 1

## 2019-01-17 MED ORDER — SUCCINYLCHOLINE CHLORIDE 200 MG/10ML IV SOSY
PREFILLED_SYRINGE | INTRAVENOUS | Status: AC
  Filled 2019-01-17: qty 10

## 2019-01-17 MED ORDER — KCL IN DEXTROSE-NACL 20-5-0.45 MEQ/L-%-% IV SOLN
INTRAVENOUS | Status: DC
  Administered 2019-01-17: 16:00:00 via INTRAVENOUS
  Filled 2019-01-17 (×2): qty 1000

## 2019-01-17 MED ORDER — DIPHENHYDRAMINE HCL 50 MG/ML IJ SOLN
INTRAMUSCULAR | Status: AC
  Filled 2019-01-17: qty 1

## 2019-01-17 MED ORDER — 0.9 % SODIUM CHLORIDE (POUR BTL) OPTIME
TOPICAL | Status: DC | PRN
  Administered 2019-01-17: 1000 mL

## 2019-01-17 MED ORDER — SUGAMMADEX SODIUM 200 MG/2ML IV SOLN
INTRAVENOUS | Status: DC | PRN
  Administered 2019-01-17: 175 mg via INTRAVENOUS

## 2019-01-17 MED ORDER — SCOPOLAMINE 1 MG/3DAYS TD PT72
MEDICATED_PATCH | TRANSDERMAL | Status: AC
  Filled 2019-01-17: qty 1

## 2019-01-17 MED ORDER — BUPIVACAINE-EPINEPHRINE 0.25% -1:200000 IJ SOLN
INTRAMUSCULAR | Status: DC | PRN
  Administered 2019-01-17: 30 mL

## 2019-01-17 MED ORDER — ROCURONIUM BROMIDE 10 MG/ML (PF) SYRINGE
PREFILLED_SYRINGE | INTRAVENOUS | Status: AC
  Filled 2019-01-17: qty 10

## 2019-01-17 MED ORDER — PROMETHAZINE HCL 25 MG/ML IJ SOLN
6.2500 mg | INTRAMUSCULAR | Status: DC | PRN
  Administered 2019-01-17: 6.25 mg via INTRAVENOUS

## 2019-01-17 MED ORDER — ROCURONIUM BROMIDE 10 MG/ML (PF) SYRINGE
PREFILLED_SYRINGE | INTRAVENOUS | Status: DC | PRN
  Administered 2019-01-17: 40 mg via INTRAVENOUS

## 2019-01-17 MED ORDER — DEXAMETHASONE SODIUM PHOSPHATE 10 MG/ML IJ SOLN
INTRAMUSCULAR | Status: AC
  Filled 2019-01-17: qty 1

## 2019-01-17 MED ORDER — ONDANSETRON HCL 4 MG/2ML IJ SOLN
INTRAMUSCULAR | Status: AC
  Filled 2019-01-17: qty 2

## 2019-01-17 MED ORDER — PROPOFOL 10 MG/ML IV BOLUS
INTRAVENOUS | Status: AC
  Filled 2019-01-17: qty 20

## 2019-01-17 MED ORDER — SCOPOLAMINE 1 MG/3DAYS TD PT72
MEDICATED_PATCH | TRANSDERMAL | Status: DC | PRN
  Administered 2019-01-17: 1 via TRANSDERMAL

## 2019-01-17 MED ORDER — SUGAMMADEX SODIUM 200 MG/2ML IV SOLN
INTRAVENOUS | Status: AC
  Filled 2019-01-17: qty 2

## 2019-01-17 SURGICAL SUPPLY — 39 items
APPLIER CLIP 5 13 M/L LIGAMAX5 (MISCELLANEOUS)
APPLIER CLIP ROT 10 11.4 M/L (STAPLE)
CABLE HIGH FREQUENCY MONO STRZ (ELECTRODE) ×3 IMPLANT
CHLORAPREP W/TINT 26 (MISCELLANEOUS) ×3 IMPLANT
CHOLANGIOGRAM CATH TAUT (CATHETERS) ×3 IMPLANT
CLIP APPLIE 5 13 M/L LIGAMAX5 (MISCELLANEOUS) IMPLANT
CLIP APPLIE ROT 10 11.4 M/L (STAPLE) IMPLANT
CLOSURE WOUND 1/4X4 (GAUZE/BANDAGES/DRESSINGS)
COVER MAYO STAND STRL (DRAPES) ×3 IMPLANT
COVER SURGICAL LIGHT HANDLE (MISCELLANEOUS) ×3 IMPLANT
COVER WAND RF STERILE (DRAPES) IMPLANT
DECANTER SPIKE VIAL GLASS SM (MISCELLANEOUS) ×3 IMPLANT
DERMABOND ADVANCED (GAUZE/BANDAGES/DRESSINGS) ×2
DERMABOND ADVANCED .7 DNX12 (GAUZE/BANDAGES/DRESSINGS) ×1 IMPLANT
DRAPE C-ARM 42X120 X-RAY (DRAPES) ×3 IMPLANT
ELECT REM PT RETURN 15FT ADLT (MISCELLANEOUS) ×3 IMPLANT
GLOVE SURG SIGNA 7.5 PF LTX (GLOVE) ×3 IMPLANT
GOWN STRL REUS W/TWL XL LVL3 (GOWN DISPOSABLE) ×9 IMPLANT
HEMOSTAT SURGICEL 4X8 (HEMOSTASIS) IMPLANT
IV CATH 14GX2 1/4 (CATHETERS) ×3 IMPLANT
IV SET EXTENSION CATH 6 NF (IV SETS) ×3 IMPLANT
KIT BASIN OR (CUSTOM PROCEDURE TRAY) ×3 IMPLANT
KIT TURNOVER KIT A (KITS) IMPLANT
POUCH RETRIEVAL ECOSAC 10 (ENDOMECHANICALS) ×1 IMPLANT
POUCH RETRIEVAL ECOSAC 10MM (ENDOMECHANICALS) ×2
SCISSORS LAP 5X35 DISP (ENDOMECHANICALS) ×3 IMPLANT
SET IRRIG TUBING LAPAROSCOPIC (IRRIGATION / IRRIGATOR) ×3 IMPLANT
SET TUBE SMOKE EVAC HIGH FLOW (TUBING) ×3 IMPLANT
SLEEVE ADV FIXATION 5X100MM (TROCAR) ×3 IMPLANT
STOPCOCK 4 WAY LG BORE MALE ST (IV SETS) ×3 IMPLANT
STRIP CLOSURE SKIN 1/4X4 (GAUZE/BANDAGES/DRESSINGS) IMPLANT
SUT MNCRL AB 4-0 PS2 18 (SUTURE) ×3 IMPLANT
SYR 10ML ECCENTRIC (SYRINGE) ×3 IMPLANT
TOWEL OR 17X26 10 PK STRL BLUE (TOWEL DISPOSABLE) ×3 IMPLANT
TOWEL OR NON WOVEN STRL DISP B (DISPOSABLE) ×3 IMPLANT
TRAY LAPAROSCOPIC (CUSTOM PROCEDURE TRAY) ×3 IMPLANT
TROCAR ADV FIXATION 11X100MM (TROCAR) IMPLANT
TROCAR ADV FIXATION 5X100MM (TROCAR) ×3 IMPLANT
TROCAR XCEL BLUNT TIP 100MML (ENDOMECHANICALS) ×3 IMPLANT

## 2019-01-17 NOTE — Anesthesia Postprocedure Evaluation (Signed)
Anesthesia Post Note  Patient: Christine Nash  Procedure(s) Performed: LAPAROSCOPIC CHOLECYSTECTOMY WITH INTRAOPERATIVE CHOLANGIOGRAM (N/A Abdomen)     Patient location during evaluation: PACU Anesthesia Type: General Level of consciousness: awake and alert Pain management: pain level controlled Vital Signs Assessment: post-procedure vital signs reviewed and stable Respiratory status: spontaneous breathing, nonlabored ventilation, respiratory function stable and patient connected to nasal cannula oxygen Cardiovascular status: blood pressure returned to baseline and stable Postop Assessment: no apparent nausea or vomiting Anesthetic complications: no    Last Vitals:  Vitals:   01/17/19 0556 01/17/19 0738  BP: 111/71 112/75  Pulse: 69 62  Resp: 16 16  Temp: 36.6 C 37 C  SpO2: 95% 98%    Last Pain:  Vitals:   01/17/19 1119  TempSrc:   PainSc: 3                  Karigan Cloninger S

## 2019-01-17 NOTE — Anesthesia Procedure Notes (Signed)
Procedure Name: Intubation Date/Time: 01/17/2019 12:43 PM Performed by: Niel Hummer, CRNA Pre-anesthesia Checklist: Patient being monitored, Emergency Drugs available, Suction available and Patient identified Patient Re-evaluated:Patient Re-evaluated prior to induction Oxygen Delivery Method: Circle system utilized Preoxygenation: Pre-oxygenation with 100% oxygen Induction Type: IV induction Laryngoscope Size: Mac and 4 Grade View: Grade I Tube type: Oral Tube size: 7.0 mm Number of attempts: 1 Airway Equipment and Method: Stylet Placement Confirmation: ETT inserted through vocal cords under direct vision,  positive ETCO2 and breath sounds checked- equal and bilateral Secured at: 23 cm Tube secured with: Tape Dental Injury: Teeth and Oropharynx as per pre-operative assessment

## 2019-01-17 NOTE — Progress Notes (Signed)
     Assessment & Plan: HD#2 - chronic cholecystitis, cholelithiasis, unrelenting biliary colic  Persistent symptoms overnight - pain, nausea  Plan lap chole with IOC today - likely Dr. Alphonsa Overall this afternoon  Continue NPO, hold Lovenox  The risks and benefits of the procedure have been discussed at length with the patient.  The patient understands the proposed procedure, potential alternative treatments, and the course of recovery to be expected.  All of the patient's questions have been answered at this time.  The patient wishes to proceed with surgery.        Armandina Gemma, MD       [facility]       Office: [phone]   Chief Complaint: Symptomatic cholelithiasis, nausea  Subjective: Patient in bed, comfortable at present.  Symptoms overnight.  Anxious to proceed with surgery.  Objective: Vital signs in last 24 hours: Temp:  [97.9 F (36.6 C)-98.6 F (37 C)] 98.6 F (37 C) (07/11 0738) Pulse Rate:  [59-75] 62 (07/11 0738) Resp:  [16] 16 (07/11 0738) BP: (111-132)/(71-90) 112/75 (07/11 0738) SpO2:  [95 %-100 %] 98 % (07/11 0738) Weight:  [86.4 kg] 86.4 kg (07/10 1930) Last BM Date: 01/15/19  Intake/Output from previous day: 07/10 0701 - 07/11 0700 In: 1000 [IV Piggyback:1000] Out: -  Intake/Output this shift: Total I/O In: -  Out: 725 [Urine:725]  Physical Exam: HEENT - sclerae clear, mucous membranes moist Neck - soft Chest - clear bilaterally Cor - RRR Abdomen - soft, no distension; mild RUQ tenderness; no mass Ext - no edema, non-tender Neuro - alert & oriented, no focal deficits  Lab Results:  Recent Labs    01/16/19 1558 01/17/19 0503  WBC 6.8 4.7  HGB 12.4 11.1*  HCT 36.7 33.4*  PLT 188 164   BMET Recent Labs    01/16/19 1558 01/17/19 0503  NA 140 142  K 3.0* 3.8  CL 106 110  CO2 24 25  GLUCOSE 86 89  BUN 14 12  CREATININE 1.03* 0.98  CALCIUM 8.9 8.1*   PT/INR No results for input(s): LABPROT, INR in the  last 72 hours. Comprehensive Metabolic Panel:    Component Value Date/Time   NA 142 01/17/2019 0503   NA 140 01/16/2019 1558   K 3.8 01/17/2019 0503   K 3.0 (L) 01/16/2019 1558   CL 110 01/17/2019 0503   CL 106 01/16/2019 1558   CO2 25 01/17/2019 0503   CO2 24 01/16/2019 1558   BUN 12 01/17/2019 0503   BUN 14 01/16/2019 1558   CREATININE 0.98 01/17/2019 0503   CREATININE 1.03 (H) 01/16/2019 1558   GLUCOSE 89 01/17/2019 0503   GLUCOSE 86 01/16/2019 1558   CALCIUM 8.1 (L) 01/17/2019 0503   CALCIUM 8.9 01/16/2019 1558   AST 15 01/17/2019 0503   AST 15 01/16/2019 1558   ALT 13 01/17/2019 0503   ALT 14 01/16/2019 1558   ALKPHOS 49 01/17/2019 0503   ALKPHOS 56 01/16/2019 1558   BILITOT 0.7 01/17/2019 0503   BILITOT 0.7 01/16/2019 1558   PROT 6.0 (L) 01/17/2019 0503   PROT 6.9 01/16/2019 1558   ALBUMIN 3.6 01/17/2019 0503   ALBUMIN 4.1 01/16/2019 1558    Studies/Results: No results found.    Armandina Gemma 01/17/2019  Patient ID: Christine Nash, female   DOB: 1971/03/05, 48 y.o.   MRN: [id number]

## 2019-01-17 NOTE — Anesthesia Preprocedure Evaluation (Signed)
Anesthesia Evaluation  Patient identified by MRN, date of birth, ID band Patient awake    Reviewed: Allergy & Precautions, NPO status , Patient's Chart, lab work & pertinent test results  Airway Mallampati: II  TM Distance: >3 FB Neck ROM: Full    Dental no notable dental hx.    Pulmonary neg pulmonary ROS,    Pulmonary exam normal breath sounds clear to auscultation       Cardiovascular negative cardio ROS Normal cardiovascular exam Rhythm:Regular Rate:Normal     Neuro/Psych Anxiety negative neurological ROS     GI/Hepatic negative GI ROS, Neg liver ROS,   Endo/Other  negative endocrine ROS  Renal/GU negative Renal ROS  negative genitourinary   Musculoskeletal negative musculoskeletal ROS (+)   Abdominal   Peds negative pediatric ROS (+)  Hematology  (+) anemia ,   Anesthesia Other Findings   Reproductive/Obstetrics negative OB ROS                             Anesthesia Physical Anesthesia Plan  ASA: II  Anesthesia Plan: General   Post-op Pain Management:    Induction: Intravenous and Rapid sequence  PONV Risk Score and Plan: 3 and Ondansetron, Dexamethasone and Treatment may vary due to age or medical condition  Airway Management Planned: Oral ETT  Additional Equipment:   Intra-op Plan:   Post-operative Plan: Extubation in OR  Informed Consent: I have reviewed the patients History and Physical, chart, labs and discussed the procedure including the risks, benefits and alternatives for the proposed anesthesia with the patient or authorized representative who has indicated his/her understanding and acceptance.     Dental advisory given  Plan Discussed with: CRNA and Surgeon  Anesthesia Plan Comments:         Anesthesia Quick Evaluation

## 2019-01-17 NOTE — Transfer of Care (Signed)
Immediate Anesthesia Transfer of Care Note  Patient: Christine Nash  Procedure(s) Performed: Procedure(s): LAPAROSCOPIC CHOLECYSTECTOMY WITH INTRAOPERATIVE CHOLANGIOGRAM (N/A)  Patient Location: PACU  Anesthesia Type:General  Level of Consciousness:  sedated, patient cooperative and responds to stimulation  Airway & Oxygen Therapy:Patient Spontanous Breathing and Patient connected to face mask oxgen  Post-op Assessment:  Report given to PACU RN and Post -op Vital signs reviewed and stable  Post vital signs:  Reviewed and stable  Last Vitals:  Vitals:   01/17/19 0556 01/17/19 0738  BP: 111/71 112/75  Pulse: 69 62  Resp: 16 16  Temp: 36.6 C 37 C  SpO2: 76% 22%    Complications: No apparent anesthesia complications

## 2019-01-17 NOTE — Op Note (Signed)
01/17/2019  1:58 PM  PATIENT:  Christine Nash, 48 y.o., female, MRN: [id number]  PREOP DIAGNOSIS:  cholelithiasis  POSTOP DIAGNOSIS:   Mild chronic cholecystitis, cholelithiasis  PROCEDURE:   Procedure(s): LAPAROSCOPIC CHOLECYSTECTOMY WITH INTRAOPERATIVE CHOLANGIOGRAM  SURGEON:   Alphonsa Overall, M.D.  ASSISTANT:   None  ANESTHESIA:   general  Anesthesiologist: Myrtie Soman, MD CRNA: Anne Fu, CRNA; Niel Hummer, CRNA  General  ASA: 2E  EBL:  minimal  ml  BLOOD ADMINISTERED: none  DRAINS: none   LOCAL MEDICATIONS USED:   30 cc of 1/4% marcaine  SPECIMEN:   Gall bladder  COUNTS CORRECT:  YES  INDICATIONS FOR PROCEDURE:  Christine Nash is a 48 y.o. (DOB: [birth year]) white female whose primary care physician is Orma Flaming, MD and comes for cholecystectomy.   She was admitted yesterday to our service at [facility] for persistent abdominal pain and cholelithiasis.   The indications and risks of the gall bladder surgery were explained to the patient.  The risks include, but are not limited to, infection, bleeding, common bile duct injury and open surgery.  SURGERY:  The patient was taken to OR room #1 at [facility].  The abdomen was prepped with chloroprep.  The patient was given 2 gm Ancef at the beginning of the operation.   A time out was held and the surgical checklist run.   An infraumbilical incision was made into the abdominal cavity.  A 12 mm Hasson trocar was inserted into the abdominal cavity through the infraumbilical incision and secured with a 0 Vicryl suture.  Three additional trocars were inserted: a 10 mm trocar in the sub-xiphoid location, a 5 mm trocar in the right mid subcostal area, and a 5 mm trocar in the right lateral subcostal area.   The abdomen was explored and the liver, stomach, and bowel that could be seen were unremarkable.   The gall bladder was minimally inflamed and mildly whitish.   I grasped the  gall bladder and rotated it cephalad.  Disssection was carried down to the gall bladder/cystic duct junction and the cystic duct isolated.  A clip was placed on the gall bladder side of the cystic duct.   An intra-operative cholangiogram was shot.   The intra-operative cholangiogram was shot using a cut off Taut catheter placed through a 14 gauge angiocath in the RUQ.  The Taut catheter was inserted in the cut cystic duct and secured with an endoclip.  A cholangiogram was shot with 13 cc of 1/2 strength Isoview.  Using fluoroscopy, the cholangiogram showed the flow of contrast into the common bile duct, up the hepatic radicals, and into the duodenum.  There was no mass or obstruction.  This was a normal intra-operative cholangiogram.   The Taut catheter was removed.  The cystic duct was tripley endoclipped and the cystic artery was identified and clipped.  The gall bladder was bluntly and sharpley dissected from the gall bladder bed.   After the gall bladder was removed from the liver, the gall bladder bed and Triangle of [place] were inspected.  There was no bleeding or bile leak.  The gall bladder was placed in a Ecco Sac bag and delivered through the umbilicus.  The abdomen was irrigated with 400 cc saline.   The trocars were then removed.  I infiltrated 30 cc of 1/4% Marcaine into the incisions.  The umbilical port closed with a 0 Vicryl suture and the skin closed with 4-0 Monocryl.  The  skin was painted with DermaBond.  The patient's sponge and needle count were correct.  The patient was transported to the RR in good condition.  Alphonsa Overall, MD, [facility] Pager: [phone] Office phone:  [phone]

## 2019-01-17 NOTE — Plan of Care (Signed)
Patient lying in bed this morning; complains of RUQ abdominal pain. Hopeful for surgery today. Will continue to monitor.

## 2019-01-17 NOTE — Progress Notes (Signed)
Chart reviewed and patient examined.  Agree with diagnosis of chronic cholecystitis and cholelithiasis  She has heard about the surgery from Drs. White and Gerkin already.  She is ready to go ahead with surgery today.  I discussed with the patient the indications and risks of gall bladder surgery.  The primary risks of gall bladder surgery include, but are not limited to, bleeding, infection, common bile duct injury, and open surgery.  There is also the risk that the patient may have continued symptoms after surgery.  We discussed the typical post-operative recovery course. I tried to answer the patient's questions.  We tried to call her husband, Christine Nash, but he did not answer the phone.  She has 3 children:  35 yo, 29 yo (with Asperger's), and 49 yo. She works as a Technical brewer for [employer].  Dr. Orma Flaming is the PCP she works with.  She is for surgery later today.  Alphonsa Overall, MD, [facility] Pager: [phone] Office phone:  [phone]

## 2019-01-18 ENCOUNTER — Encounter (HOSPITAL_COMMUNITY): Payer: Self-pay | Admitting: Surgery

## 2019-01-18 DIAGNOSIS — K8010 Calculus of gallbladder with chronic cholecystitis without obstruction: Secondary | ICD-10-CM | POA: Diagnosis not present

## 2019-01-18 DIAGNOSIS — Z79899 Other long term (current) drug therapy: Secondary | ICD-10-CM | POA: Diagnosis not present

## 2019-01-18 DIAGNOSIS — Z885 Allergy status to narcotic agent status: Secondary | ICD-10-CM | POA: Diagnosis not present

## 2019-01-18 DIAGNOSIS — F411 Generalized anxiety disorder: Secondary | ICD-10-CM | POA: Diagnosis not present

## 2019-01-18 DIAGNOSIS — Z1159 Encounter for screening for other viral diseases: Secondary | ICD-10-CM | POA: Diagnosis not present

## 2019-01-18 DIAGNOSIS — Z888 Allergy status to other drugs, medicaments and biological substances status: Secondary | ICD-10-CM | POA: Diagnosis not present

## 2019-01-18 MED ORDER — OXYCODONE HCL 5 MG PO TABS: 5.0000 mg | ORAL_TABLET | Freq: Four times a day (QID) | ORAL | 0 refills | Status: DC | PRN

## 2019-01-18 NOTE — Discharge Instructions (Signed)
[facility] - DISCHARGE INSTRUCTIONS TO PATIENT  Return to work on:  01/26/2019  Activity:  Driving - May drive in 2 to 4 days, if off pain meds and doing okay.   Lifting - No lifting more than 15 pounds for one week, then no limit  Wound Care:   Leave the incision dry for 2 days, then you may shower         No public water - lake, pool, etc - for 3 weeks  Diet:  As tolerated  Follow up appointment:  Call Dr. Pollie Friar office [facility]) at [phone] for an appointment in 2 to 4 weeks..         We are doing "e" visits post op during this Covid-19 virus epidemic, our office will contact you about this arrangement.  If you have not heard from our office, call our office the day before your scheduled visit to make plans for your visit.  Medications and dosages:  Resume your home medications.  You have a prescription for:  Oxycodone  Call Dr. Lucia Gaskins or his office  [phone]) if you have:  Temperature greater than 100.4,  Persistent nausea and vomiting,  Severe uncontrolled pain,  Redness, tenderness, or signs of infection (pain, swelling, redness, odor or green/yellow discharge around the site),  Difficulty breathing, headache or visual disturbances,  Any other questions or concerns you may have after discharge.  In an emergency, call 911 or go to an Emergency Department at a nearby hospital.

## 2019-01-18 NOTE — Discharge Summary (Signed)
Physician Discharge Summary  Patient ID:  Christine Nash  MRN: [id number]  DOB/AGE: 1971-02-17 48 y.o.  Admit date: 01/16/2019 Discharge date: 01/18/2019  Discharge Diagnoses:  1.  Chronic cholecystitis with cholelithiasis 2.  History of anxiety   Active Problems:   Acute cholecystitis  Operation: Procedure(s): LAPAROSCOPIC CHOLECYSTECTOMY WITH INTRAOPERATIVE CHOLANGIOGRAM on 01/17/2019 Lucia Gaskins  Discharged Condition: good  Hospital Course: Amaurie Nash is an 48 y.o. female whose primary care physician is Orma Flaming, MD and who was admitted 01/16/2019 with a chief complaint of  Chief Complaint  Patient presents with  . Cholelithiasis  .   She was brought to the operating room on 01/17/2019 and underwent lap cholecystectomy with IOC.  She is now one day post op and ready to go home.  The discharge instructions were reviewed with the patient.  Consults: None  Significant Diagnostic Studies: Results for orders placed or performed during the hospital encounter of 01/16/19  SARS Coronavirus 2 (CEPHEID - Performed in [place] hospital lab), [facility] Order   Specimen: Nasopharyngeal Swab  Result Value Ref Range   SARS Coronavirus 2 NEGATIVE NEGATIVE  Surgical PCR screen   Specimen: Nasal Mucosa; Nasal Swab  Result Value Ref Range   MRSA, PCR NEGATIVE NEGATIVE   Staphylococcus aureus NEGATIVE NEGATIVE  CBC with Differential  Result Value Ref Range   WBC 6.8 4.0 - 10.5 K/uL   RBC 4.19 3.87 - 5.11 MIL/uL   Hemoglobin 12.4 12.0 - 15.0 g/dL   HCT 36.7 36.0 - 46.0 %   MCV 87.6 80.0 - 100.0 fL   MCH 29.6 26.0 - 34.0 pg   MCHC 33.8 30.0 - 36.0 g/dL   RDW 12.4 11.5 - 15.5 %   Platelets 188 150 - 400 K/uL   nRBC 0.0 0.0 - 0.2 %   Neutrophils Relative % 59 %   Neutro Abs 4.0 1.7 - 7.7 K/uL   Lymphocytes Relative 34 %   Lymphs Abs 2.3 0.7 - 4.0 K/uL   Monocytes Relative 6 %   Monocytes Absolute 0.4 0.1 - 1.0 K/uL   Eosinophils Relative 1 %   Eosinophils  Absolute 0.1 0.0 - 0.5 K/uL   Basophils Relative 0 %   Basophils Absolute 0.0 0.0 - 0.1 K/uL   Immature Granulocytes 0 %   Abs Immature Granulocytes 0.01 0.00 - 0.07 K/uL  Comprehensive metabolic panel  Result Value Ref Range   Sodium 140 135 - 145 mmol/L   Potassium 3.0 (L) 3.5 - 5.1 mmol/L   Chloride 106 98 - 111 mmol/L   CO2 24 22 - 32 mmol/L   Glucose, Bld 86 70 - 99 mg/dL   BUN 14 6 - 20 mg/dL   Creatinine, Ser 1.03 (H) 0.44 - 1.00 mg/dL   Calcium 8.9 8.9 - 10.3 mg/dL   Total Protein 6.9 6.5 - 8.1 g/dL   Albumin 4.1 3.5 - 5.0 g/dL   AST 15 15 - 41 U/L   ALT 14 0 - 44 U/L   Alkaline Phosphatase 56 38 - 126 U/L   Total Bilirubin 0.7 0.3 - 1.2 mg/dL   GFR calc non Af Amer >60 >60 mL/min   GFR calc Af Amer >60 >60 mL/min   Anion gap 10 5 - 15  Lipase, blood  Result Value Ref Range   Lipase 34 11 - 51 U/L  Comprehensive metabolic panel  Result Value Ref Range   Sodium 142 135 - 145 mmol/L   Potassium 3.8 3.5 - 5.1 mmol/L  Chloride 110 98 - 111 mmol/L   CO2 25 22 - 32 mmol/L   Glucose, Bld 89 70 - 99 mg/dL   BUN 12 6 - 20 mg/dL   Creatinine, Ser 0.98 0.44 - 1.00 mg/dL   Calcium 8.1 (L) 8.9 - 10.3 mg/dL   Total Protein 6.0 (L) 6.5 - 8.1 g/dL   Albumin 3.6 3.5 - 5.0 g/dL   AST 15 15 - 41 U/L   ALT 13 0 - 44 U/L   Alkaline Phosphatase 49 38 - 126 U/L   Total Bilirubin 0.7 0.3 - 1.2 mg/dL   GFR calc non Af Amer >60 >60 mL/min   GFR calc Af Amer >60 >60 mL/min   Anion gap 7 5 - 15  CBC  Result Value Ref Range   WBC 4.7 4.0 - 10.5 K/uL   RBC 3.70 (L) 3.87 - 5.11 MIL/uL   Hemoglobin 11.1 (L) 12.0 - 15.0 g/dL   HCT 33.4 (L) 36.0 - 46.0 %   MCV 90.3 80.0 - 100.0 fL   MCH 30.0 26.0 - 34.0 pg   MCHC 33.2 30.0 - 36.0 g/dL   RDW 12.6 11.5 - 15.5 %   Platelets 164 150 - 400 K/uL   nRBC 0.0 0.0 - 0.2 %    Dg Chest 2 View  Result Date: 01/14/2019 CLINICAL DATA:  Upper back and bilateral shoulder pain. EXAM: CHEST - 2 VIEW COMPARISON:  None. FINDINGS: The cardiomediastinal  contours are normal. The lungs are clear. Pulmonary vasculature is normal. No consolidation, pleural effusion, or pneumothorax. No acute osseous abnormalities are seen. Surgical hardware in the lower cervical spine is partially included. IMPRESSION: Unremarkable radiographs of the chest. Electronically Signed   By: Keith Rake M.D.   On: 01/14/2019 20:10   Dg Cholangiogram Operative  Result Date: 01/17/2019 CLINICAL DATA:  Cholelithiasis. EXAM: INTRAOPERATIVE CHOLANGIOGRAM TECHNIQUE: Cholangiographic images from the C-arm fluoroscopic device were submitted for interpretation post-operatively. Please see the procedural report for the amount of contrast and the fluoroscopy time utilized. COMPARISON:  Ultrasound 01/15/2019 FINDINGS: Mild dilatation of the common bile duct. Reflux into the intrahepatic biliary system. Contrast drains into the duodenum. Mild irregularity at the distal common bile duct but no evidence for an obstructing stone. IMPRESSION: Patent biliary system. Mild dilatation of the common bile duct. No obstructing stones. Electronically Signed   By: Markus Daft M.D.   On: 01/17/2019 14:15   US Abdomen Limited Ruq  Result Date: 01/15/2019 CLINICAL DATA:  Chest pain and nausea for 1 day. EXAM: ULTRASOUND ABDOMEN LIMITED RIGHT UPPER QUADRANT COMPARISON:  None. FINDINGS: Gallbladder: Multiple mobile gallstones are present. The largest measures 7 mm. Stones are present in the neck of the gallbladder and into the common bile duct. There is no gallbladder wall thickening. No sonographic Percell Miller sign is present. Common bile duct: Diameter: 3.3 mm, within normal limits. Liver: No focal lesion identified. Within normal limits in parenchymal echogenicity. Portal vein is patent on color Doppler imaging with normal direction of blood flow towards the liver. IMPRESSION: 1. Cholelithiasis with multiple mobile shadowing stones extending into the neck of the gallbladder and possibly the common bile duct  without obstruction. 2. No gallbladder wall thickening or sonographic Murphy sign to suggest acute cholecystitis. Electronically Signed   By: San Morelle M.D.   On: 01/15/2019 04:41    Discharge Exam:  Vitals:   01/18/19 0301 01/18/19 0604  BP: 100/74 115/72  Pulse: (!) 49 (!) 55  Resp: 15 16  Temp: 98.2  F (36.8 C) 98.2 F (36.8 C)  SpO2: 95% 97%    General: WN WF who is alert and generally healthy appearing.  Lungs: Clear to auscultation and symmetric breath sounds. Heart:  RRR. No murmur or rub. Abdomen: Soft. Incisions look good.  Has BS.  Discharge Medications:   Allergies as of 01/18/2019      Reactions   Meperidine Hcl Hives   Prochlorperazine Edisylate    Other reaction(s): agitiation   Propranolol    "brain fog" fatigue    Trokendi [employer]] Other (See Comments)   "brain fog"    Phentermine Palpitations   Chest pain / palpitations       Medication List    TAKE these medications   B-COMPLEX PO Take 1 capsule by mouth daily.   baclofen 20 MG tablet Commonly known as: LIORESAL Take 1 tablet (20 mg total) by mouth 3 (three) times daily.   busPIRone 7.5 MG tablet Commonly known as: BUSPAR Take 1 tablet (7.5 mg total) by mouth 3 (three) times daily.   cholecalciferol 25 MCG (1000 UT) tablet Commonly known as: VITAMIN D Take 2,000 Units by mouth daily.   dicyclomine 20 MG tablet Commonly known as: BENTYL Take 1 tablet (20 mg total) by mouth 2 (two) times daily.   estradiol 0.5 MG tablet Commonly known as: ESTRACE Take 0.5 mg by mouth daily.   Estradiol 10 MCG Tabs vaginal tablet INSERT 1 TABLET INTO THE VAGINA TWICE A WEEK   ibuprofen 200 MG tablet Commonly known as: ADVIL Take 800 mg by mouth daily as needed for moderate pain.   Liraglutide -Weight Management 18 MG/3ML Sopn Commonly known as: Saxenda Inject 3 mg into the skin daily.   naproxen sodium 220 MG tablet Commonly known as: ALEVE Take 440 mg by mouth daily as  needed (pain).   ondansetron 4 MG disintegrating tablet Commonly known as: Zofran ODT Take 1 tablet (4 mg total) by mouth every 8 (eight) hours as needed for nausea or vomiting.   oxyCODONE 5 MG immediate release tablet Commonly known as: Oxy IR/ROXICODONE Take 1 tablet (5 mg total) by mouth every 6 (six) hours as needed for severe pain.   oxyCODONE-acetaminophen 5-325 MG tablet Commonly known as: PERCOCET/ROXICET Take 1-2 tablets by mouth every 6 (six) hours as needed for severe pain.   vitamin B-12 1000 MCG tablet Commonly known as: CYANOCOBALAMIN Take 1,000 mcg by mouth daily.       Disposition: Discharge disposition: 01-Home or Self Care       Discharge Instructions    Diet - low sodium heart healthy   Complete by: As directed    Increase activity slowly   Complete by: As directed          Signed: Alphonsa Overall, M.D., [facility] Office:  [phone]  01/18/2019, 7:37 AM

## 2019-01-26 MED FILL — ESTRADIOL 0.5 MG TABS: 0.5 | 30 days supply | Qty: 30 | Fill #2

## 2019-01-29 ENCOUNTER — Other Ambulatory Visit: Payer: Self-pay

## 2019-01-29 MED ORDER — TRAZODONE HCL 50 MG PO TABS: 25.0000 mg | ORAL_TABLET | Freq: Every evening | ORAL | 3 refills | Status: DC | PRN

## 2019-02-20 ENCOUNTER — Other Ambulatory Visit: Payer: Self-pay | Admitting: Family Medicine

## 2019-02-20 MED ORDER — BACLOFEN 20 MG PO TABS: 20.0000 mg | ORAL_TABLET | Freq: Three times a day (TID) | ORAL | 1 refills | Status: DC

## 2019-02-20 MED FILL — BACLOFEN 20 MG TABLET: 20 | 30 days supply | Qty: 90 | Fill #0

## 2019-02-24 MED FILL — ESTRADIOL 10 MCG TABS: 10 | 28 days supply | Qty: 8 | Fill #1

## 2019-02-24 MED FILL — ESTRADIOL 0.5 MG TABS: 0.5 | 30 days supply | Qty: 30 | Fill #3

## 2019-02-24 MED FILL — BUSPIRONE HCL 7.5 MG TABS: 7.5 | 30 days supply | Qty: 90 | Fill #1

## 2019-03-09 ENCOUNTER — Encounter: Payer: Self-pay | Admitting: Family Medicine

## 2019-03-09 ENCOUNTER — Ambulatory Visit (INDEPENDENT_AMBULATORY_CARE_PROVIDER_SITE_OTHER): Payer: 59 | Admitting: Family Medicine

## 2019-03-09 VITALS — BP 118/60 | HR 80 | Wt 186.0 lb

## 2019-03-09 DIAGNOSIS — Z0000 Encounter for general adult medical examination without abnormal findings: Secondary | ICD-10-CM | POA: Diagnosis not present

## 2019-03-09 DIAGNOSIS — F411 Generalized anxiety disorder: Secondary | ICD-10-CM | POA: Diagnosis not present

## 2019-03-09 DIAGNOSIS — E559 Vitamin D deficiency, unspecified: Secondary | ICD-10-CM

## 2019-03-09 DIAGNOSIS — E782 Mixed hyperlipidemia: Secondary | ICD-10-CM | POA: Diagnosis not present

## 2019-03-09 LAB — COMPREHENSIVE METABOLIC PANEL
ALT: 15 U/L (ref 0–35)
AST: 15 U/L (ref 0–37)
Albumin: 4.5 g/dL (ref 3.5–5.2)
Alkaline Phosphatase: 68 U/L (ref 39–117)
BUN: 14 mg/dL (ref 6–23)
CO2: 29 mEq/L (ref 19–32)
Calcium: 9.7 mg/dL (ref 8.4–10.5)
Chloride: 106 mEq/L (ref 96–112)
Creatinine, Ser: 0.88 mg/dL (ref 0.40–1.20)
GFR: 68.15 mL/min (ref >60.00)
Glucose, Bld: 88 mg/dL (ref 70–99)
Potassium: 4.2 mEq/L (ref 3.5–5.1)
Sodium: 142 mEq/L (ref 135–145)
Total Bilirubin: 0.7 mg/dL (ref 0.2–1.2)
Total Protein: 6.8 g/dL (ref 6.0–8.3)

## 2019-03-09 LAB — CBC WITH DIFFERENTIAL/PLATELET
Basophils Absolute: 0 10*3/uL (ref 0.0–0.1)
Basophils Relative: 0.9 % (ref 0.0–3.0)
Eosinophils Absolute: 0.2 10*3/uL (ref 0.0–0.7)
Eosinophils Relative: 3.5 % (ref 0.0–5.0)
HCT: 38.3 % (ref 36.0–46.0)
Hemoglobin: 13 g/dL (ref 12.0–15.0)
Lymphocytes Relative: 24.5 % (ref 12.0–46.0)
Lymphs Abs: 1.1 10*3/uL (ref 0.7–4.0)
MCHC: 34 g/dL (ref 30.0–36.0)
MCV: 87.3 fl (ref 78.0–100.0)
Monocytes Absolute: 0.3 10*3/uL (ref 0.1–1.0)
Monocytes Relative: 6.1 % (ref 3.0–12.0)
Neutro Abs: 3 10*3/uL (ref 1.4–7.7)
Neutrophils Relative %: 65 % (ref 43.0–77.0)
Platelets: 213 10*3/uL (ref 150.0–400.0)
RBC: 4.39 Mil/uL (ref 3.87–5.11)
RDW: 13.1 % (ref 11.5–15.5)
WBC: 4.7 10*3/uL (ref 4.0–10.5)

## 2019-03-09 LAB — VITAMIN D 25 HYDROXY (VIT D DEFICIENCY, FRACTURES): VITD: 35.99 ng/mL (ref 30.00–100.00)

## 2019-03-09 LAB — LIPID PANEL
Cholesterol: 284 mg/dL — ABNORMAL HIGH (ref 0–200)
HDL: 73.8 mg/dL (ref >39.00)
LDL Cholesterol: 197 mg/dL — ABNORMAL HIGH (ref 0–99)
NonHDL: 210.42
Total CHOL/HDL Ratio: 4
Triglycerides: 69 mg/dL (ref 0.0–149.0)
VLDL: 13.8 mg/dL (ref 0.0–40.0)

## 2019-03-09 LAB — TSH: TSH: 1.35 u[IU]/mL (ref 0.35–4.50)

## 2019-03-09 MED ORDER — BUSPIRONE HCL 10 MG PO TABS: 10.0000 mg | ORAL_TABLET | Freq: Two times a day (BID) | ORAL | 3 refills | Status: DC

## 2019-03-09 NOTE — Addendum Note (Signed)
Addended by: Orma Flaming on: 03/09/2019 03:19 PM   Modules accepted: Orders

## 2019-03-09 NOTE — Patient Instructions (Signed)
Increased buspar to '10mg'$  Bid. Sent in new px for you.  You're doing awesome!   Preventive Care 93-49 Years Old, Female Preventive care refers to visits with your health care provider and lifestyle choices that can promote health and wellness. This includes:  A yearly physical exam. This may also be called an annual well check.  Regular dental visits and eye exams.  Immunizations.  Screening for certain conditions.  Healthy lifestyle choices, such as eating a healthy diet, getting regular exercise, not using drugs or products that contain nicotine and tobacco, and limiting alcohol use. What can I expect for my preventive care visit? Physical exam Your health care provider will check your:  Height and weight. This may be used to calculate body mass index (BMI), which tells if you are at a healthy weight.  Heart rate and blood pressure.  Skin for abnormal spots. Counseling Your health care provider may ask you questions about your:  Alcohol, tobacco, and drug use.  Emotional well-being.  Home and relationship well-being.  Sexual activity.  Eating habits.  Work and work Statistician.  Method of birth control.  Menstrual cycle.  Pregnancy history. What immunizations do I need?  Influenza (flu) vaccine  This is recommended every year. Tetanus, diphtheria, and pertussis (Tdap) vaccine  You may need a Td booster every 10 years. Varicella (chickenpox) vaccine  You may need this if you have not been vaccinated. Zoster (shingles) vaccine  You may need this after age 56. Measles, mumps, and rubella (MMR) vaccine  You may need at least one dose of MMR if you were born in 1957 or later. You may also need a second dose. Pneumococcal conjugate (PCV13) vaccine  You may need this if you have certain conditions and were not previously vaccinated. Pneumococcal polysaccharide (PPSV23) vaccine  You may need one or two doses if you smoke cigarettes or if you have certain  conditions. Meningococcal conjugate (MenACWY) vaccine  You may need this if you have certain conditions. Hepatitis A vaccine  You may need this if you have certain conditions or if you travel or work in places where you may be exposed to hepatitis A. Hepatitis B vaccine  You may need this if you have certain conditions or if you travel or work in places where you may be exposed to hepatitis B. Haemophilus influenzae type b (Hib) vaccine  You may need this if you have certain conditions. Human papillomavirus (HPV) vaccine  If recommended by your health care provider, you may need three doses over 6 months. You may receive vaccines as individual doses or as more than one vaccine together in one shot (combination vaccines). Talk with your health care provider about the risks and benefits of combination vaccines. What tests do I need? Blood tests  Lipid and cholesterol levels. These may be checked every 5 years, or more frequently if you are over 25 years old.  Hepatitis C test.  Hepatitis B test. Screening  Lung cancer screening. You may have this screening every year starting at age 5 if you have a 30-pack-year history of smoking and currently smoke or have quit within the past 15 years.  Colorectal cancer screening. All adults should have this screening starting at age 21 and continuing until age 76. Your health care provider may recommend screening at age 65 if you are at increased risk. You will have tests every 1-10 years, depending on your results and the type of screening test.  Diabetes screening. This is done by checking  your blood sugar (glucose) after you have not eaten for a while (fasting). You may have this done every 1-3 years.  Mammogram. This may be done every 1-2 years. Talk with your health care provider about when you should start having regular mammograms. This may depend on whether you have a family history of breast cancer.  BRCA-related cancer screening. This  may be done if you have a family history of breast, ovarian, tubal, or peritoneal cancers.  Pelvic exam and Pap test. This may be done every 3 years starting at age 51. Starting at age 49, this may be done every 5 years if you have a Pap test in combination with an HPV test. Other tests  Sexually transmitted disease (STD) testing.  Bone density scan. This is done to screen for osteoporosis. You may have this scan if you are at high risk for osteoporosis. Follow these instructions at home: Eating and drinking  Eat a diet that includes fresh fruits and vegetables, whole grains, lean protein, and low-fat dairy.  Take vitamin and mineral supplements as recommended by your health care provider.  Do not drink alcohol if: ? Your health care provider tells you not to drink. ? You are pregnant, may be pregnant, or are planning to become pregnant.  If you drink alcohol: ? Limit how much you have to 0-1 drink a day. ? Be aware of how much alcohol is in your drink. In the [place] one drink equals one 12 oz bottle of beer (355 mL), one 5 oz glass of wine (148 mL), or one 1 oz glass of hard liquor (44 mL). Lifestyle  Take daily care of your teeth and gums.  Stay active. Exercise for at least 30 minutes on 5 or more days each week.  Do not use any products that contain nicotine or tobacco, such as cigarettes, e-cigarettes, and chewing tobacco. If you need help quitting, ask your health care provider.  If you are sexually active, practice safe sex. Use a condom or other form of birth control (contraception) in order to prevent pregnancy and STIs (sexually transmitted infections).  If told by your health care provider, take low-dose aspirin daily starting at age 39. What's next?  Visit your health care provider once a year for a well check visit.  Ask your health care provider how often you should have your eyes and teeth checked.  Stay up to date on all vaccines. This information is not  intended to replace advice given to you by your health care provider. Make sure you discuss any questions you have with your health care provider. Document Released: 07/22/2015 Document Revised: 03/06/2018 Document Reviewed: 03/06/2018 Elsevier Patient Education  2020 [employer].

## 2019-03-09 NOTE — Progress Notes (Signed)
Patient: Christine Nash MRN: [id number] DOB: 11-06-1970 PCP: Orma Flaming, MD     Subjective:  Chief Complaint  Patient presents with  . Annual Exam    HPI: The patient is a 48 y.o. female who presents today for annual exam. She denies any changes to past medical history. She was recently hospitalized for acute cholecystitis with cholecystectomy in July.  They are following a well balanced diet and exercise plan. Weight has been decreasing steadily. She has lost 17 pounds with diet/exercise. Was on saxenda for a brief stent, but had her gallbladder out and stopped this. No complaints today.   Vitamin D deficiency: checking today  GAD: Currently on buspar 7.5mg  BID. She can tell a huge difference in her anxiety since being on this. No longer has panic attacks. Feels like she is overall controlled, but is wondering if we can increase dose just a little bit.   Hyperlipidemia: ASCVD risk was 1.1% last year. Has lost 17 pounds in last year and is still working on losing weight. Her goal weight is 150 pounds.   HRT: followed by gyn.   Immunization History  Administered Date(s) Administered  . Tdap 02/11/2018   Colonoscopy: due at age 44 years.  Mammogram: 11/2018: wnl Pap smear: had partial hysterectomy. Will get pap with Dr. Talbert Nan.   Review of Systems  Constitutional: Negative for chills, fatigue and fever.  HENT: Negative for dental problem, ear pain, hearing loss and trouble swallowing.   Eyes: Negative for visual disturbance.  Respiratory: Negative for cough, chest tightness and shortness of breath.   Cardiovascular: Negative for chest pain, palpitations and leg swelling.  Gastrointestinal: Negative for abdominal pain, blood in stool, constipation, diarrhea and nausea.  Endocrine: Negative for cold intolerance, polydipsia, polyphagia and polyuria.  Genitourinary: Negative for dysuria and hematuria.  Musculoskeletal: Positive for arthralgias (left knee pain ).  Skin:  Negative for rash.  Neurological: Negative for dizziness and headaches.  Psychiatric/Behavioral: Negative for dysphoric mood and sleep disturbance. The patient is not nervous/anxious.     Allergies Patient is allergic to meperidine hcl; prochlorperazine edisylate; propranolol; trokendi xr [topiramate er]; and phentermine.  Past Medical History Patient  has no past medical history on file.  Surgical History Patient  has a past surgical history that includes Cervical spine surgery (2011,2012,2017); Cesarean section [id number]); Tubal ligation (2011); Laparoscopic hysterectomy (2013); and Cholecystectomy (N/A, 01/17/2019).  Family History Pateint's family history includes Breast cancer in her maternal aunt.  Social History Patient  reports that she has never smoked. She has never used smokeless tobacco. She reports previous alcohol use. She reports that she does not use drugs.    Objective: Vitals:   03/09/19 1117  BP: 118/60  Pulse: 80  SpO2: 98%  Weight: 186 lb (84.4 kg)    Body mass index is 31.93 kg/m.  Physical Exam Vitals signs reviewed.  Constitutional:      Appearance: Normal appearance. She is well-developed.  HENT:     Head: Normocephalic and atraumatic.     Right Ear: Tympanic membrane, ear canal and external ear normal.     Left Ear: Tympanic membrane, ear canal and external ear normal.     Nose: Nose normal.     Mouth/Throat:     Mouth: Mucous membranes are moist.  Eyes:     Extraocular Movements: Extraocular movements intact.     Conjunctiva/sclera: Conjunctivae normal.     Pupils: Pupils are equal, round, and reactive to light.  Neck:     Musculoskeletal:  Normal range of motion and neck supple.     Thyroid: No thyromegaly.  Cardiovascular:     Rate and Rhythm: Normal rate and regular rhythm.     Heart sounds: Normal heart sounds. No murmur.  Pulmonary:     Effort: Pulmonary effort is normal.     Breath sounds: Normal breath sounds.  Abdominal:      General: Abdomen is flat. Bowel sounds are normal. There is no distension.     Palpations: Abdomen is soft.     Tenderness: There is no abdominal tenderness.  Lymphadenopathy:     Cervical: No cervical adenopathy.  Skin:    General: Skin is warm and dry.     Capillary Refill: Capillary refill takes less than 2 seconds.     Findings: No rash.  Neurological:     General: No focal deficit present.     Mental Status: She is alert and oriented to person, place, and time.     Cranial Nerves: No cranial nerve deficit.     Coordination: Coordination normal.     Deep Tendon Reflexes: Reflexes normal.  Psychiatric:        Mood and Affect: Mood normal.        Behavior: Behavior normal.      Office Visit from 03/09/2019 in [place]  PHQ-2 Total Score  0         GAD 7 : Generalized Anxiety Score 03/09/2019 02/11/2018  Nervous, Anxious, on Edge 1 2  Control/stop worrying 1 2  Worry too much - different things 1 2  Trouble relaxing 1 3  Restless 1 2  Easily annoyed or irritable 1 2  Afraid - awful might happen 0 2  Total GAD 7 Score 6 15  Anxiety Difficulty Not difficult at all Somewhat difficult     Assessment/plan: 1. Annual physical exam Routine lab work. Doing good with weight loss and exercise. Needs to get in to see dentist. Pap smear with gyn otherwise utd on her HM. F/u in one year or as needed.  - CBC with Differential/Platelet; Future - Comprehensive metabolic panel; Future - Lipid panel; Future - TSH; Future - TSH - Lipid panel - Comprehensive metabolic panel - CBC with Differential/Platelet  2. Vitamin D deficiency  - VITAMIN D 25 Hydroxy (Vit-D Deficiency, Fractures); Future - VITAMIN D 25 Hydroxy (Vit-D Deficiency, Fractures)  3. Generalized anxiety disorder GAD7 with significant improvement. Still not exactly where she wants to be. Will increase her buspar to 10mg  BID and see if we can improve GAD7 even more. Overall she is very happy  with the medication. Continue exercise and counseling as well.   4. Mixed hyperlipidemia ascvd 1.1%. lost 17 pounds and has started exercising. Fasting labs today.    Return in about 1 year (around 03/08/2020).   Orma Flaming, MD [place]  03/09/2019

## 2019-03-10 MED FILL — busPIRone HCL 10 MG TABS: 10 | 90 days supply | Qty: 180 | Fill #0

## 2019-03-11 ENCOUNTER — Other Ambulatory Visit: Payer: Self-pay | Admitting: Family Medicine

## 2019-03-11 DIAGNOSIS — E782 Mixed hyperlipidemia: Secondary | ICD-10-CM

## 2019-03-11 MED ORDER — ROSUVASTATIN CALCIUM 5 MG PO TABS: 5.0000 mg | ORAL_TABLET | Freq: Every day | ORAL | 3 refills | Status: DC

## 2019-03-11 MED FILL — ROSUVASTATIN CALCIUM 5 MG T: 5 | 90 days supply | Qty: 90 | Fill #0

## 2019-03-11 NOTE — Progress Notes (Signed)
[facility]

## 2019-04-06 ENCOUNTER — Other Ambulatory Visit: Payer: Self-pay | Admitting: Family Medicine

## 2019-04-06 MED ORDER — BACLOFEN 20 MG PO TABS: 20.0000 mg | ORAL_TABLET | Freq: Three times a day (TID) | ORAL | 1 refills | Status: DC

## 2019-04-06 MED FILL — BACLOFEN 20 MG TABLET: 20 | 60 days supply | Qty: 180 | Fill #0

## 2019-04-07 ENCOUNTER — Other Ambulatory Visit: Payer: Self-pay

## 2019-04-09 ENCOUNTER — Ambulatory Visit (INDEPENDENT_AMBULATORY_CARE_PROVIDER_SITE_OTHER): Payer: 59 | Admitting: Obstetrics and Gynecology

## 2019-04-09 ENCOUNTER — Encounter: Payer: Self-pay | Admitting: Obstetrics and Gynecology

## 2019-04-09 ENCOUNTER — Other Ambulatory Visit: Payer: Self-pay

## 2019-04-09 VITALS — BP 112/62 | HR 84 | Temp 97.3°F | Ht 63.5 in | Wt 191.0 lb

## 2019-04-09 DIAGNOSIS — Z01419 Encounter for gynecological examination (general) (routine) without abnormal findings: Secondary | ICD-10-CM | POA: Diagnosis not present

## 2019-04-09 DIAGNOSIS — N952 Postmenopausal atrophic vaginitis: Secondary | ICD-10-CM

## 2019-04-09 DIAGNOSIS — Z90711 Acquired absence of uterus with remaining cervical stump: Secondary | ICD-10-CM | POA: Diagnosis not present

## 2019-04-09 DIAGNOSIS — N898 Other specified noninflammatory disorders of vagina: Secondary | ICD-10-CM

## 2019-04-09 MED ORDER — ESTRADIOL 10 MCG VA TABS: ORAL_TABLET | VAGINAL | 3 refills | Status: DC

## 2019-04-09 MED FILL — ESTRADIOL 10 MCG TABS: 10 | 84 days supply | Qty: 36 | Fill #0

## 2019-04-09 NOTE — Progress Notes (Signed)
48 y.o. G3P2103 Married White or Caucasian Not Hispanic or Latino female here for annual exam. Pain with intercourse if she does not use vaginal estrogen in combination with oral estrogen. Would like to not be on hormones. Wants to discuss this. Wants labs done today. She isn't having any significant vasomotor symptoms. She went on ERT in ~2014 for mood swings/depression. She went off of ERT for a few years. Went back on it because sex was painful even on the vaginal estrogen. With oral and vaginal estrogen sex isn't painful.  No vaginal bleeding. She had a supracervical hysterectomy.  She does note an increase in vaginal d/c, no itching, burning or odor.     No LMP recorded. Patient has had a hysterectomy.          Sexually active: Yes.    The current method of family planning is status post hysterectomy.    Exercising: No.  The patient does not participate in regular exercise at present. Smoker:  no  Health Maintenance: Pap:  10/24/2017 WNL, no hpv History of abnormal Pap:  no MMG:  11/25/2018 Birads 1 negative BMD:   Never Colonoscopy: 10/04/2010 inflammation TDaP:  02/11/2018 Gardasil: None   reports that she has never smoked. She has never used smokeless tobacco. She reports previous alcohol use. She reports that she does not use drugs. CMA at [employer]. Kids are 25, 23 and 9. Older 2 sons Fathers just died of a MI at 14. Remarried, youngest is 39. Younger son with Aspergers, lives with her.   Past Medical History:  Diagnosis Date  . Anemia   . Anxiety   . Depression   . Endometriosis   . Hyperlipidemia   Just started on a statin this summer.   Past Surgical History:  Procedure Laterality Date  . ABDOMINAL HYSTERECTOMY    . BUNIONECTOMY     2008  . CERVICAL SPINE SURGERY  2011,2012,2017   ACDF repair w/instrumentation  . CESAREAN SECTION  [id number]  . CHOLECYSTECTOMY N/A 01/17/2019   Procedure: LAPAROSCOPIC CHOLECYSTECTOMY WITH INTRAOPERATIVE CHOLANGIOGRAM;  Surgeon:  Alphonsa Overall, MD;  Location: [facility];  Service: General;  Laterality: N/A;  . KNEE SURGERY     left knee  . LAPAROSCOPIC HYSTERECTOMY  2013  . SHOULDER SURGERY     1996  . TUBAL LIGATION  2011  Supracervical hysterectomy  Current Outpatient Medications  Medication Sig Dispense Refill  . B Complex-Biotin-FA (B-COMPLEX PO) Take 1 capsule by mouth daily.    . baclofen (LIORESAL) 20 MG tablet Take 1 tablet (20 mg total) by mouth 3 (three) times daily. 180 tablet 1  . busPIRone (BUSPAR) 10 MG tablet Take 1 tablet (10 mg total) by mouth 2 (two) times daily. 180 tablet 3  . cholecalciferol (VITAMIN D) 25 MCG (1000 UT) tablet Take 2,000 Units by mouth daily.    Marland Kitchen estradiol (ESTRACE) 0.5 MG tablet Take 0.5 mg by mouth daily.  10  . Estradiol 10 MCG TABS vaginal tablet INSERT 1 TABLET INTO THE VAGINA TWICE A WEEK  0  . Ibuprofen 200 MG CAPS Take by mouth as needed.    . rosuvastatin (CRESTOR) 5 MG tablet Take 1 tablet (5 mg total) by mouth daily. 90 tablet 3   No current facility-administered medications for this visit.     Family History  Problem Relation Age of Onset  . Breast cancer Maternal Aunt   . Stroke Mother   . COPD Mother   . Bladder Cancer Father   .  Melanoma Father   . Heart attack Father   . Diabetes Maternal Grandmother     Review of Systems  Constitutional: Negative.   HENT: Negative.   Eyes: Negative.   Respiratory: Negative.   Cardiovascular: Negative.   Gastrointestinal: Negative.   Endocrine: Negative.   Genitourinary: Negative.   Musculoskeletal: Negative.   Skin: Negative.   Allergic/Immunologic: Negative.   Neurological: Negative.   Hematological: Negative.   Psychiatric/Behavioral: Negative.     Exam:   BP 112/62 (BP Location: Right Arm, Patient Position: Sitting, Cuff Size: Normal)   Pulse 84   Temp (!) 97.3 F (36.3 C) (Skin)   Ht 5' 3.5" (1.613 m)   Wt 191 lb (86.6 kg)   BMI 33.30 kg/m   Weight change: @WEIGHTCHANGE @ Height:   Height: 5'  3.5" (161.3 cm)  Ht Readings from Last 3 Encounters:  04/09/19 5' 3.5" (1.613 m)  01/16/19 5\' 4"  (1.626 m)  01/14/19 5' 3.5" (1.613 m)    General appearance: alert, cooperative and appears stated age Head: Normocephalic, without obvious abnormality, atraumatic Neck: no adenopathy, supple, symmetrical, trachea midline and thyroid normal to inspection and palpation Lungs: clear to auscultation bilaterally Cardiovascular: regular rate and rhythm Breasts: normal appearance, no masses or tenderness Abdomen: soft, non-tender; non distended,  no masses,  no organomegaly Extremities: extremities normal, atraumatic, no cyanosis or edema Skin: Skin color, texture, turgor normal. No rashes or lesions Lymph nodes: Cervical, supraclavicular, and axillary nodes normal. No abnormal inguinal nodes palpated Neurologic: Grossly normal   Pelvic: External genitalia:  no lesions              Urethra:  normal appearing urethra with no masses, tenderness or lesions              Bartholins and Skenes: normal                 Vagina: normal appearing vagina with normal color and discharge, no lesions              Cervix: no lesions               Bimanual Exam:  Uterus:  uterus absent              Adnexa: no mass, fullness, tenderness               Rectovaginal: Confirms               Anus:  normal sphincter tone, no lesions  Chaperone was present for exam.  A:  Well Woman with normal exam  H/O supracervical hysterectomy  Vaginal atrophy, controlled with oral and vaginal estrogen, wasn't controlled on vagifem 2 x a week. No vasomotor symptoms  Vaginal discharge  P:   Pap is UTD  Discussed breast self exam  Discussed calcium and vit D intake  Labs UTD with her primary   Mammogram UTD  Colonoscopy in 2012  Stop oral estrogen  Discussed the option of vaginal estrogen cream, she felt it was too messy. Will increase the vagifem 10 mcg vaginal tablet to 3 x a week  Affirm sent

## 2019-04-09 NOTE — Patient Instructions (Signed)
EXERCISE AND DIET:  We recommended that you start or continue a regular exercise program for good health. Regular exercise means any activity that makes your heart beat faster and makes you sweat.  We recommend exercising at least 30 minutes per day at least 3 days a week, preferably 4 or 5.  We also recommend a diet low in fat and sugar.  Inactivity, poor dietary choices and obesity can cause diabetes, heart attack, stroke, and kidney damage, among others.    ALCOHOL AND SMOKING:  Women should limit their alcohol intake to no more than 7 drinks/beers/glasses of wine (combined, not each!) per week. Moderation of alcohol intake to this level decreases your risk of breast cancer and liver damage. And of course, no recreational drugs are part of a healthy lifestyle.  And absolutely no smoking or even second hand smoke. Most people know smoking can cause heart and lung diseases, but did you know it also contributes to weakening of your bones? Aging of your skin?  Yellowing of your teeth and nails?  CALCIUM AND VITAMIN D:  Adequate intake of calcium and Vitamin D are recommended.  The recommendations for exact amounts of these supplements seem to change often, but generally speaking 1,200 mg of calcium (between diet and supplement) and 800 units of Vitamin D per day seems prudent. Certain women may benefit from higher intake of Vitamin D.  If you are among these women, your doctor will have told you during your visit.    PAP SMEARS:  Pap smears, to check for cervical cancer or precancers,  have traditionally been done yearly, although recent scientific advances have shown that most women can have pap smears less often.  However, every woman still should have a physical exam from her gynecologist every year. It will include a breast check, inspection of the vulva and vagina to check for abnormal growths or skin changes, a visual exam of the cervix, and then an exam to evaluate the size and shape of the uterus and  ovaries.  And after 49 years of age, a rectal exam is indicated to check for rectal cancers. We will also provide age appropriate advice regarding health maintenance, like when you should have certain vaccines, screening for sexually transmitted diseases, bone density testing, colonoscopy, mammograms, etc.   MAMMOGRAMS:  All women over 40 years old should have a yearly mammogram. Many facilities now offer a "3D" mammogram, which may cost around $50 extra out of pocket. If possible,  we recommend you accept the option to have the 3D mammogram performed.  It both reduces the number of women who will be called back for extra views which then turn out to be normal, and it is better than the routine mammogram at detecting truly abnormal areas.    COLON CANCER SCREENING: Now recommend starting at age 45. At this time colonoscopy is not covered for routine screening until 50. There are take home tests that can be done between 45-49.   COLONOSCOPY:  Colonoscopy to screen for colon cancer is recommended for all women at age 50.  We know, you hate the idea of the prep.  We agree, BUT, having colon cancer and not knowing it is worse!!  Colon cancer so often starts as a polyp that can be seen and removed at colonscopy, which can quite literally save your life!  And if your first colonoscopy is normal and you have no family history of colon cancer, most women don't have to have it again for   10 years.  Once every ten years, you can do something that may end up saving your life, right?  We will be happy to help you get it scheduled when you are ready.  Be sure to check your insurance coverage so you understand how much it will cost.  It may be covered as a preventative service at no cost, but you should check your particular policy.      Breast Self-Awareness Breast self-awareness means being familiar with how your breasts look and feel. It involves checking your breasts regularly and reporting any changes to your  health care provider. Practicing breast self-awareness is important. A change in your breasts can be a sign of a serious medical problem. Being familiar with how your breasts look and feel allows you to find any problems early, when treatment is more likely to be successful. All women should practice breast self-awareness, including women who have had breast implants. How to do a breast self-exam One way to learn what is normal for your breasts and whether your breasts are changing is to do a breast self-exam. To do a breast self-exam: Look for Changes  1. Remove all the clothing above your waist. 2. Stand in front of a mirror in a room with good lighting. 3. Put your hands on your hips. 4. Push your hands firmly downward. 5. Compare your breasts in the mirror. Look for differences between them (asymmetry), such as: ? Differences in shape. ? Differences in size. ? Puckers, dips, and bumps in one breast and not the other. 6. Look at each breast for changes in your skin, such as: ? Redness. ? Scaly areas. 7. Look for changes in your nipples, such as: ? Discharge. ? Bleeding. ? Dimpling. ? Redness. ? A change in position. Feel for Changes Carefully feel your breasts for lumps and changes. It is best to do this while lying on your back on the floor and again while sitting or standing in the shower or tub with soapy water on your skin. Feel each breast in the following way:  Place the arm on the side of the breast you are examining above your head.  Feel your breast with the other hand.  Start in the nipple area and make  inch (2 cm) overlapping circles to feel your breast. Use the pads of your three middle fingers to do this. Apply light pressure, then medium pressure, then firm pressure. The light pressure will allow you to feel the tissue closest to the skin. The medium pressure will allow you to feel the tissue that is a little deeper. The firm pressure will allow you to feel the tissue  close to the ribs.  Continue the overlapping circles, moving downward over the breast until you feel your ribs below your breast.  Move one finger-width toward the center of the body. Continue to use the  inch (2 cm) overlapping circles to feel your breast as you move slowly up toward your collarbone.  Continue the up and down exam using all three pressures until you reach your armpit.  Write Down What You Find  Write down what is normal for each breast and any changes that you find. Keep a written record with breast changes or normal findings for each breast. By writing this information down, you do not need to depend only on memory for size, tenderness, or location. Write down where you are in your menstrual cycle, if you are still menstruating. If you are having trouble noticing differences   in your breasts, do not get discouraged. With time you will become more familiar with the variations in your breasts and more comfortable with the exam. How often should I examine my breasts? Examine your breasts every month. If you are breastfeeding, the best time to examine your breasts is after a feeding or after using a breast pump. If you menstruate, the best time to examine your breasts is 5-7 days after your period is over. During your period, your breasts are lumpier, and it may be more difficult to notice changes. When should I see my health care provider? See your health care provider if you notice:  A change in shape or size of your breasts or nipples.  A change in the skin of your breast or nipples, such as a reddened or scaly area.  Unusual discharge from your nipples.  A lump or thick area that was not there before.  Pain in your breasts.  Anything that concerns you.  

## 2019-04-10 ENCOUNTER — Other Ambulatory Visit: Payer: Self-pay

## 2019-04-10 LAB — VAGINITIS/VAGINOSIS, DNA PROBE
Candida Species: POSITIVE — AB
Gardnerella vaginalis: NEGATIVE
Trichomonas vaginosis: NEGATIVE

## 2019-04-10 MED ORDER — FLUCONAZOLE 150 MG PO TABS: 150.0000 mg | ORAL_TABLET | Freq: Once | ORAL | 0 refills | Status: AC

## 2019-04-10 MED FILL — FLUCONAZOLE 150 MG TABS: 150 | 6 days supply | Qty: 2 | Fill #0

## 2019-04-22 ENCOUNTER — Encounter: Payer: Self-pay | Admitting: Obstetrics and Gynecology

## 2019-05-13 ENCOUNTER — Telehealth: Payer: Self-pay

## 2019-05-13 MED ORDER — ESTRADIOL 0.5 MG PO TABS: 0.5000 mg | ORAL_TABLET | Freq: Every day | ORAL | 3 refills | Status: DC

## 2019-05-13 MED FILL — ESTRADIOL 0.5 MG TABS: 0.5 | 90 days supply | Qty: 90 | Fill #0

## 2019-05-13 NOTE — Telephone Encounter (Signed)
Spoke with patient, advised per Dr. Jertson. Patient verbalizes understanding and is agreeable.   Encounter closed.  

## 2019-05-13 NOTE — Telephone Encounter (Signed)
I didn't see the follow up message. I've sent in a year supply of the 0.5 mg estrace tablet.  Please inform and apologize for the delay.

## 2019-05-13 NOTE — Telephone Encounter (Signed)
Left Message for pt to call back to office re: Mychart follow up.            RE: Visit Follow-Up Question Received: 2 weeks ago Message Contents  [place], [facility]  Phone Number: [phone]        Thank you so much for your quick reply! I think I'd like to resume taking the oral Estradiol tabs. And we will try a lubricant also to see if that helps. Have a nice weekend! No rush to send in :-)   [facility]

## 2019-06-03 NOTE — Progress Notes (Signed)
Error

## 2019-06-10 ENCOUNTER — Other Ambulatory Visit: Payer: Self-pay | Admitting: Family Medicine

## 2019-06-10 NOTE — Progress Notes (Signed)
Error. Has refills.  Orma Flaming, MD [place]

## 2019-06-12 MED FILL — ROSUVASTATIN CALCIUM 5 MG T: 5 | 90 days supply | Qty: 90 | Fill #1

## 2019-06-16 ENCOUNTER — Other Ambulatory Visit: Payer: Self-pay

## 2019-06-16 ENCOUNTER — Other Ambulatory Visit: Payer: Self-pay | Admitting: Family Medicine

## 2019-06-16 ENCOUNTER — Emergency Department (HOSPITAL_COMMUNITY)
Admission: EM | Admit: 2019-06-16 | Discharge: 2019-06-16 | Disposition: A | Discharge status: Home / Self Care | Payer: 59 | Attending: Emergency Medicine | Admitting: Emergency Medicine

## 2019-06-16 DIAGNOSIS — M542 Cervicalgia: Secondary | ICD-10-CM | POA: Diagnosis not present

## 2019-06-16 DIAGNOSIS — G43909 Migraine, unspecified, not intractable, without status migrainosus: Secondary | ICD-10-CM | POA: Diagnosis not present

## 2019-06-16 DIAGNOSIS — Z79899 Other long term (current) drug therapy: Secondary | ICD-10-CM | POA: Diagnosis not present

## 2019-06-16 DIAGNOSIS — R519 Headache, unspecified: Secondary | ICD-10-CM | POA: Diagnosis present

## 2019-06-16 MED ORDER — BUTALBITAL-APAP-CAFFEINE 50-325-40 MG PO TABS: 1.0000 | ORAL_TABLET | Freq: Four times a day (QID) | ORAL | 0 refills | Status: DC | PRN

## 2019-06-16 MED ORDER — HYDROCODONE-ACETAMINOPHEN 5-325 MG PO TABS: 1.0000 | ORAL_TABLET | Freq: Four times a day (QID) | ORAL | 0 refills | Status: DC | PRN

## 2019-06-16 MED ORDER — METOCLOPRAMIDE HCL 5 MG/ML IJ SOLN
10.0000 mg | Freq: Once | INTRAMUSCULAR | Status: AC
  Administered 2019-06-16: 10 mg via INTRAMUSCULAR
  Filled 2019-06-16: qty 2

## 2019-06-16 MED FILL — BUTALB-ACETAMIN-CAFF 50-325: 50-325-40 | 3 days supply | Qty: 20 | Fill #0

## 2019-06-16 NOTE — Progress Notes (Signed)
In ER for neck/headache/shoulder pain. Long history of neck issues with surgery. May  Need f/u MRI. Will f/u with me in clinic. 15 norco given. None given in ER. Given fioricet and instructed not to take this together.  Orma Flaming, MD [place]

## 2019-06-16 NOTE — ED Notes (Signed)
Allen MD at bedside 

## 2019-06-16 NOTE — ED Notes (Signed)
Pt verbalizes understanding of DC instructions. Pt belongings returned and is ambulatory out of ED.  

## 2019-06-16 NOTE — ED Provider Notes (Signed)
[facility] DEPT Provider Note   CSN: [id number] Arrival date & time: 06/16/19  1115     History   Chief Complaint Chief Complaint  Patient presents with  . Migraine  . Back Pain  . Neck Injury    HPI Paysley Volkmer is a 48 y.o. female.     48 year old female with history of migraines presents with bilateral neck pain rating to her forehead similar to her prior migraines.  Denies any fever or chills.  No photophobia.  Has had headaches for some time she feels is related to her history of having cervical spine surgery.  She denies any focal neurological deficits.  Her migraine just started yesterday.  She has used over-the-counter medications without relief.     Past Medical History:  Diagnosis Date  . Anemia   . Anxiety   . Depression   . Endometriosis   . Hyperlipidemia     Patient Active Problem List   Diagnosis Date Noted  . Generalized anxiety disorder 02/11/2018  . Hormone replacement therapy (HRT) 02/11/2018  . Spinal stenosis of cervical region 02/11/2018  . Hyperlipidemia 02/11/2018  . Vitamin D deficiency 02/11/2018  . Attention deficit hyperactivity disorder, predominantly inattentive type 06/27/2011    Past Surgical History:  Procedure Laterality Date  . ABDOMINAL HYSTERECTOMY    . BUNIONECTOMY     2008  . CERVICAL SPINE SURGERY  2011,2012,2017   ACDF repair w/instrumentation  . CESAREAN SECTION  [id number]  . CHOLECYSTECTOMY N/A 01/17/2019   Procedure: LAPAROSCOPIC CHOLECYSTECTOMY WITH INTRAOPERATIVE CHOLANGIOGRAM;  Surgeon: Alphonsa Overall, MD;  Location: [facility];  Service: General;  Laterality: N/A;  . KNEE SURGERY     left knee  . SHOULDER SURGERY     1996  . SUPRACERVICAL ABDOMINAL HYSTERECTOMY  2013  . TUBAL LIGATION  2011     OB History    Gravida  3   Para  3   Term  2   Preterm  1   AB      Living  3     SAB      TAB      Ectopic      Multiple      Live Births  3             Home Medications    Prior to Admission medications   Medication Sig Start Date End Date Taking? Authorizing Provider  B Complex-Biotin-FA (B-COMPLEX PO) Take 1 capsule by mouth daily.    [provider]  baclofen (LIORESAL) 20 MG tablet Take 1 tablet (20 mg total) by mouth 3 (three) times daily. 04/06/19   Orma Flaming, MD  busPIRone (BUSPAR) 10 MG tablet Take 1 tablet (10 mg total) by mouth 2 (two) times daily. 03/09/19   Orma Flaming, MD  cholecalciferol (VITAMIN D) 25 MCG (1000 UT) tablet Take 2,000 Units by mouth daily.    [provider]  estradiol (ESTRACE) 0.5 MG tablet Take 1 tablet (0.5 mg total) by mouth daily. 05/13/19   Salvadore Dom, MD  Estradiol 10 MCG TABS vaginal tablet Place one tablet vaginally 3 x a week at hs 04/09/19   Salvadore Dom, MD  Ibuprofen 200 MG CAPS Take by mouth as needed.    [provider]  rosuvastatin (CRESTOR) 5 MG tablet Take 1 tablet (5 mg total) by mouth daily. 03/11/19   Orma Flaming, MD    Family History Family History  Problem Relation Age of Onset  .  Breast cancer Maternal Aunt   . Stroke Mother   . COPD Mother   . Bladder Cancer Father   . Melanoma Father   . Heart attack Father   . Diabetes Maternal Grandmother     Social History Social History   Tobacco Use  . Smoking status: Never Smoker  . Smokeless tobacco: Never Used  Substance Use Topics  . Alcohol use: Not Currently    Frequency: Never  . Drug use: Never     Allergies   Meperidine hcl, Prochlorperazine edisylate, Propranolol, Trokendi xr [topiramate er], and Phentermine   Review of Systems Review of Systems  All other systems reviewed and are negative.    Physical Exam Updated Vital Signs BP (!) 173/96   Pulse 77   Temp 97.8 F (36.6 C) (Oral)   Resp 15   Ht 1.626 m (5\' 4" )   Wt 83.9 kg   SpO2 100%   BMI 31.76 kg/m   Physical Exam Vitals signs and nursing note reviewed.  Constitutional:      General:  She is not in acute distress.    Appearance: Normal appearance. She is well-developed. She is not toxic-appearing.  HENT:     Head: Normocephalic and atraumatic.  Eyes:     General: Lids are normal.     Conjunctiva/sclera: Conjunctivae normal.     Pupils: Pupils are equal, round, and reactive to light.  Neck:     Musculoskeletal: Normal range of motion and neck supple.     Thyroid: No thyroid mass.     Trachea: No tracheal deviation.  Cardiovascular:     Rate and Rhythm: Normal rate and regular rhythm.     Heart sounds: Normal heart sounds. No murmur. No gallop.   Pulmonary:     Effort: Pulmonary effort is normal. No respiratory distress.     Breath sounds: Normal breath sounds. No stridor. No decreased breath sounds, wheezing, rhonchi or rales.  Abdominal:     General: Bowel sounds are normal. There is no distension.     Palpations: Abdomen is soft.     Tenderness: There is no abdominal tenderness. There is no rebound.  Musculoskeletal: Normal range of motion.        General: No tenderness.  Skin:    General: Skin is warm and dry.     Findings: No abrasion or rash.  Neurological:     Mental Status: She is alert and oriented to person, place, and time.     GCS: GCS eye subscore is 4. GCS verbal subscore is 5. GCS motor subscore is 6.     Cranial Nerves: No cranial nerve deficit.     Sensory: No sensory deficit.     Gait: Gait is intact.  Psychiatric:        Speech: Speech normal.        Behavior: Behavior normal.      ED Treatments / Results  Labs (all labs ordered are listed, but only abnormal results are displayed) Labs Reviewed - No data to display  EKG None  Radiology No results found.  Procedures Procedures (including critical care time)  Medications Ordered in ED Medications  metoCLOPramide (REGLAN) injection 10 mg (has no administration in time range)     Initial Impression / Assessment and Plan / ED Course  I have reviewed the triage vital signs  and the nursing notes.  Pertinent labs & imaging results that were available during my care of the patient were reviewed by me and considered  in my medical decision making (see chart for details).        Patient treated with Reglan here and will prescribe prescription for Fioricet and have her follow-up with her doctor  Final Clinical Impressions(s) / ED Diagnoses   Final diagnoses:  None    ED Discharge Orders    None       Lacretia Leigh, MD 06/16/19 1511

## 2019-06-16 NOTE — ED Triage Notes (Signed)
Patient states she developed a migraine yesterday and since the pain has traveled to the whole back of her head, bilateral shoulders, and upper back area. Patient states she has had three neck surgeries (spinal fusions)  and is concerned something is wrong with her neck again. Patient states pain is the same type of pain. Patient also notes she had root canal on 07/08/19.  Pain rated 9/10, patient took baclofen this morning, but nothing for pain.

## 2019-06-19 ENCOUNTER — Encounter: Payer: Self-pay | Admitting: Family Medicine

## 2019-06-19 ENCOUNTER — Ambulatory Visit (INDEPENDENT_AMBULATORY_CARE_PROVIDER_SITE_OTHER): Payer: 59 | Admitting: Family Medicine

## 2019-06-19 VITALS — BP 140/74 | HR 88 | Wt 191.0 lb

## 2019-06-19 DIAGNOSIS — M4802 Spinal stenosis, cervical region: Secondary | ICD-10-CM

## 2019-06-19 DIAGNOSIS — M542 Cervicalgia: Secondary | ICD-10-CM

## 2019-06-19 MED ORDER — METHYLPREDNISOLONE ACETATE 80 MG/ML IJ SUSP
80.0000 mg | Freq: Once | INTRAMUSCULAR | Status: AC
  Administered 2019-06-19: 80 mg via INTRAMUSCULAR
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
## 2019-06-22 ENCOUNTER — Ambulatory Visit (INDEPENDENT_AMBULATORY_CARE_PROVIDER_SITE_OTHER): Payer: 59

## 2019-06-22 ENCOUNTER — Other Ambulatory Visit: Payer: Self-pay | Admitting: Family Medicine

## 2019-06-22 DIAGNOSIS — M4802 Spinal stenosis, cervical region: Secondary | ICD-10-CM

## 2019-06-22 DIAGNOSIS — M542 Cervicalgia: Secondary | ICD-10-CM | POA: Diagnosis not present

## 2019-06-22 MED ORDER — BACLOFEN 20 MG PO TABS: 20.0000 mg | ORAL_TABLET | Freq: Three times a day (TID) | ORAL | 1 refills | Status: DC

## 2019-06-22 MED ORDER — HYDROCODONE-ACETAMINOPHEN 7.5-325 MG PO TABS: 1.0000 | ORAL_TABLET | Freq: Four times a day (QID) | ORAL | 0 refills | Status: DC | PRN

## 2019-06-22 MED FILL — BACLOFEN 20 MG TABLET: 20 | 60 days supply | Qty: 180 | Fill #0

## 2019-07-08 ENCOUNTER — Other Ambulatory Visit: Payer: Self-pay | Admitting: Family Medicine

## 2019-07-08 MED ORDER — BUSPIRONE HCL 10 MG PO TABS: 10.0000 mg | ORAL_TABLET | Freq: Two times a day (BID) | ORAL | 3 refills | Status: DC

## 2019-07-08 MED FILL — busPIRone HCL 10 MG TABS: 10 | 90 days supply | Qty: 180 | Fill #0

## 2019-07-13 ENCOUNTER — Other Ambulatory Visit: Payer: Self-pay | Admitting: Family Medicine

## 2019-07-13 MED ORDER — HYDROCODONE-ACETAMINOPHEN 7.5-325 MG PO TABS: 1.0000 | ORAL_TABLET | Freq: Four times a day (QID) | ORAL | 0 refills | Status: DC | PRN

## 2019-07-13 MED FILL — HYDROCODON-APAP 7.5-325: 7.5-325 | 7 days supply | Qty: 30 | Fill #0

## 2019-07-13 NOTE — Progress Notes (Signed)
Refilled norco. She has her MRI on 1/7.  Orma Flaming, MD [place]

## 2019-07-16 ENCOUNTER — Ambulatory Visit
Admission: RE | Admit: 2019-07-16 | Discharge: 2019-07-16 | Disposition: A | Discharge status: Home / Self Care | Payer: 59 | Source: Ambulatory Visit | Attending: Family Medicine | Admitting: Family Medicine

## 2019-07-16 DIAGNOSIS — M4802 Spinal stenosis, cervical region: Secondary | ICD-10-CM

## 2019-07-16 DIAGNOSIS — M542 Cervicalgia: Secondary | ICD-10-CM

## 2019-07-16 DIAGNOSIS — M5011 Cervical disc disorder with radiculopathy,  high cervical region: Secondary | ICD-10-CM | POA: Diagnosis not present

## 2019-07-17 ENCOUNTER — Other Ambulatory Visit: Payer: Self-pay | Admitting: Family Medicine

## 2019-07-17 DIAGNOSIS — M47812 Spondylosis without myelopathy or radiculopathy, cervical region: Secondary | ICD-10-CM

## 2019-07-17 DIAGNOSIS — M5412 Radiculopathy, cervical region: Secondary | ICD-10-CM

## 2019-07-22 DIAGNOSIS — G4489 Other headache syndrome: Secondary | ICD-10-CM | POA: Diagnosis not present

## 2019-07-22 DIAGNOSIS — R42 Dizziness and giddiness: Secondary | ICD-10-CM | POA: Diagnosis not present

## 2019-07-22 DIAGNOSIS — R52 Pain, unspecified: Secondary | ICD-10-CM | POA: Diagnosis not present

## 2019-07-22 DIAGNOSIS — R069 Unspecified abnormalities of breathing: Secondary | ICD-10-CM | POA: Diagnosis not present

## 2019-07-22 DIAGNOSIS — R0602 Shortness of breath: Secondary | ICD-10-CM | POA: Diagnosis not present

## 2019-07-29 ENCOUNTER — Other Ambulatory Visit: Payer: Self-pay | Admitting: Family Medicine

## 2019-07-29 MED ORDER — ONDANSETRON HCL 4 MG PO TABS: 4.0000 mg | ORAL_TABLET | Freq: Three times a day (TID) | ORAL | 0 refills | Status: DC | PRN

## 2019-07-29 MED FILL — ONDANSETRON HCL 4 MG TABLET: 4 | 7 days supply | Qty: 20 | Fill #0

## 2019-07-29 NOTE — Progress Notes (Signed)
Patient at home waiting on covid testing. Likely has as she has nausea, h/, body aches and loss of tate and smell. zofran sent in prn. precautions given for worsening symptoms or virtual visit.  Orma Flaming, MD [place]

## 2019-07-30 ENCOUNTER — Other Ambulatory Visit: Payer: Self-pay | Admitting: Family Medicine

## 2019-07-30 MED ORDER — ONDANSETRON HCL 4 MG PO TABS: 4.0000 mg | ORAL_TABLET | Freq: Three times a day (TID) | ORAL | 0 refills | Status: DC | PRN

## 2019-07-30 MED ORDER — HYDROCODONE-ACETAMINOPHEN 7.5-325 MG PO TABS: 1.0000 | ORAL_TABLET | Freq: Four times a day (QID) | ORAL | 0 refills | Status: DC | PRN

## 2019-07-30 NOTE — Progress Notes (Signed)
Very short supply of norco sent in as it's early. She has appointment with pain management on 2/1. I will be out on vacation next week so gave her someto get through until this appointment.  Orma Flaming, MD [place]

## 2019-08-12 ENCOUNTER — Other Ambulatory Visit: Payer: Self-pay | Admitting: Family Medicine

## 2019-08-12 DIAGNOSIS — M5412 Radiculopathy, cervical region: Secondary | ICD-10-CM

## 2019-08-12 DIAGNOSIS — M4802 Spinal stenosis, cervical region: Secondary | ICD-10-CM

## 2019-08-12 MED ORDER — HYDROCODONE-ACETAMINOPHEN 7.5-325 MG PO TABS: 1.0000 | ORAL_TABLET | Freq: Two times a day (BID) | ORAL | 0 refills | Status: DC | PRN

## 2019-08-12 NOTE — Progress Notes (Signed)
Patient cancelled her pain management appointment on 2/1 due to loss of insurance. She has new insurance and will need a new pain management referral. Will place today and #30 norco sent in. pmp website reviewed.  Orma Flaming, MD [place]

## 2019-08-19 ENCOUNTER — Other Ambulatory Visit: Payer: Self-pay | Admitting: Family Medicine

## 2019-08-19 DIAGNOSIS — G44209 Tension-type headache, unspecified, not intractable: Secondary | ICD-10-CM

## 2019-08-28 ENCOUNTER — Telehealth: Payer: Self-pay | Admitting: Family Medicine

## 2019-08-28 DIAGNOSIS — G44209 Tension-type headache, unspecified, not intractable: Secondary | ICD-10-CM

## 2019-08-28 NOTE — Telephone Encounter (Signed)
Done.  [name], MD [place]   

## 2019-08-28 NOTE — Telephone Encounter (Signed)
Spoke with pt, she cannot get in with [facility] until April - she would like a new referral put in for [facility]. Marland Kitchen

## 2019-09-16 ENCOUNTER — Other Ambulatory Visit: Payer: Self-pay | Admitting: Family Medicine

## 2019-09-16 MED ORDER — ROSUVASTATIN CALCIUM 5 MG PO TABS: 5.0000 mg | ORAL_TABLET | Freq: Every day | ORAL | 3 refills | Status: DC

## 2019-10-14 NOTE — Progress Notes (Signed)
[id number]    Provider:  Dr Jaynee Eagles Requesting Provider: Orma Flaming, MD Primary Care Provider:  Orma Flaming, MD  CC:   cervicogenic headaches  HPI:  Christine Nash is a 49 y.o. female here as requested by Orma Flaming, MD for headache. I reviewed chart, she sees a pain medicine specialist at [facility] last seen 08/25/2019 for cervicogenic headache.  Per review of chart, she has a past medical history significant for generalized anxiety, hyperlipidemia, ADHD, cervical spinal stenosis status post prior fusion and subsequent revision x2 most recently in 2017, she presented to the pain clinic for consultation of neck pain and headache.  In December 2020 she began experiencing excruciating migraine headaches along with shoulder and neck tightness soreness, not similar to her prior neck pain, headaches have increased in frequency and occurring daily, prodrome neck and shoulder tightness that worsens to become pressure in the back and sides of her head, can be 8 out of 10, no associated visual changes such as flashes or auras, she is sometimes able to prevent the headaches by resting, using a neck massager or taking pain medication which is Norco.  No radiation down her arms.  No hand weakness or numbness.  She takes Norco twice daily as prescribed by her PCT, baclofen and ibuprofen.  She is allergic to meperidine, Topamax and phentermine.  Diagnosed with cervicogenic headache, lacks features suggestive of occipital neuralgia, MRI imaging performed recently demonstrates no significant foraminal or central canal stenosis and she denies radicular symptoms however the MRI does show facet atrophy above the level of her fusion, she may benefit from cervical facet blocks, RFA C2-C3 occipital nerve or C4 bilaterally.  She has had headaches worsening 9-10 months ago, started years years before in the setting of multiple cervical surgeries. Stress triggers the headaches. She has  been to the ED, excruciating pain into the nec and the back of the head radiating into the traps, painful, dull, achy, pressure then sharp, shooting pain, worse with standing. She went to [facility] ED at [facility]. They gave her a shot of reglan and fioricet and she was very upset. Headaches always like these, in the neck and back of the head occipitally. She will occasionally get some trigeminal pain but not often. Feels like a huge man is on her shoulders and pressing down on the head. No significant migrainous features. Massage and heat helps. She tried a tens unit. She does not want any ablation or pain interventions. No weakness of arms. No radicular symptoms. No weakness in legs either. No vision changes, facial weakness. No other focal neurologic deficits, associated symptoms, inciting events or modifiable factors.  Reviewed notes, labs and imaging from outside physicians, which showed:  See above for notes from pain clinic  Personally reviewed MRi cervical spine iages and agree with the following 07/16/2019: IMPRESSION: 1. Prior C5-C7 fusion. No residual foraminal or canal stenosis at the fused levels. 2. Mild left foraminal stenosis at C2-3 due to facet arthropathy where there are reactive marrow changes, which can be a focal source of pain. 3. Mild bilateral foraminal stenosis at C3-4. Mild left foraminal stenosis at C7-T1. 4. No canal stenosis of the cervical spine.    Review of Systems: Patient complains of symptoms per HPI as well as the following symptoms: neck pain, headache. Pertinent negatives and positives per HPI. All others negative.   Social History   Socioeconomic History  . Marital status: Married    Spouse name: Not on file  .  Number of children: 3  . Years of education: Not on file  . Highest education level: Not on file  Occupational History  . Not on file  Tobacco Use  . Smoking status: Never Smoker  . Smokeless tobacco: Never Used  Substance and Sexual  Activity  . Alcohol use: Not Currently  . Drug use: Never  . Sexual activity: Yes    Birth control/protection: Surgical  Other Topics Concern  . Not on file  Social History Narrative   Lives with husband and 2 of her children   Right handed   Caffeine: 1 cup of coffee in the morning and occasionally sweet tea   Social Determinants of Health   Financial Resource Strain:   . Difficulty of Paying Living Expenses:   Food Insecurity:   . Worried About Charity fundraiser in the Last Year:   . Arboriculturist in the Last Year:   Transportation Needs:   . Film/video editor (Medical):   Marland Kitchen Lack of Transportation (Non-Medical):   Physical Activity:   . Days of Exercise per Week:   . Minutes of Exercise per Session:   Stress:   . Feeling of Stress :   Social Connections:   . Frequency of Communication with Friends and Family:   . Frequency of Social Gatherings with Friends and Family:   . Attends Religious Services:   . Active Member of Clubs or Organizations:   . Attends Archivist Meetings:   Marland Kitchen Marital Status:   Intimate Partner Violence:   . Fear of Current or Ex-Partner:   . Emotionally Abused:   Marland Kitchen Physically Abused:   . Sexually Abused:     Family History  Problem Relation Age of Onset  . Breast cancer Maternal Aunt   . Stroke Mother   . COPD Mother   . Bladder Cancer Father   . Melanoma Father   . Heart attack Father   . Diabetes Maternal Grandmother     Past Medical History:  Diagnosis Date  . Anemia   . Anxiety   . Depression   . Endometriosis   . Hyperlipidemia     Patient Active Problem List   Diagnosis Date Noted  . Cervicogenic headache 10/18/2019  . Generalized anxiety disorder 02/11/2018  . Hormone replacement therapy (HRT) 02/11/2018  . Spinal stenosis of cervical region 02/11/2018  . Hyperlipidemia 02/11/2018  . Vitamin D deficiency 02/11/2018  . Attention deficit hyperactivity disorder, predominantly inattentive type 06/27/2011     Past Surgical History:  Procedure Laterality Date  . ABDOMINAL HYSTERECTOMY    . BUNIONECTOMY     2008  . CERVICAL SPINE SURGERY  2011,2012,2017   ACDF repair w/instrumentation  . CESAREAN SECTION  [id number]  . CHOLECYSTECTOMY N/A 01/17/2019   Procedure: LAPAROSCOPIC CHOLECYSTECTOMY WITH INTRAOPERATIVE CHOLANGIOGRAM;  Surgeon: Alphonsa Overall, MD;  Location: [facility];  Service: General;  Laterality: N/A;  . KNEE SURGERY     left knee  . SHOULDER SURGERY     1996  . SUPRACERVICAL ABDOMINAL HYSTERECTOMY  2013  . TUBAL LIGATION  2011    Current Outpatient Medications  Medication Sig Dispense Refill  . B Complex-Biotin-FA (B-COMPLEX PO) Take 1 capsule by mouth daily.    . baclofen (LIORESAL) 20 MG tablet Take 1 tablet (20 mg total) by mouth 3 (three) times daily. (Patient taking differently: Take 20 mg by mouth at bedtime. ) 180 tablet 1  . cholecalciferol (VITAMIN D) 25 MCG (1000 UT) tablet Take 2,000  Units by mouth daily.    . naproxen sodium (ALEVE) 220 MG tablet Take 440 mg by mouth as needed.    . rosuvastatin (CRESTOR) 5 MG tablet Take 1 tablet (5 mg total) by mouth daily. 90 tablet 3  . UNABLE TO FIND Take 2 capsules by mouth every morning. Med Name: "Super You" wellness supplement    . UNABLE TO FIND every morning. Med Name: Multi Collagen Supplement    . gabapentin (NEURONTIN) 300 MG capsule Take 1 capsule (300 mg total) by mouth 3 (three) times daily. 270 capsule 3  . methylPREDNISolone (MEDROL DOSEPAK) 4 MG TBPK tablet Take pills in the morning with food for 6 days. 6-5-4-3-2-1 21 tablet 1   No current facility-administered medications for this visit.    Allergies as of 10/15/2019 - Review Complete 10/15/2019  Allergen Reaction Noted  . Meperidine hcl Hives 06/27/2011  . Prochlorperazine edisylate  06/27/2011  . Propranolol  10/09/2018  . Trokendi xr [topiramate er] Other (See Comments) 10/09/2018  . Phentermine Palpitations 10/09/2018    Vitals: BP 130/83 (BP  Location: Right Arm, Patient Position: Sitting)   Pulse 69   Temp 98 F (36.7 C) Comment: taken at front  Ht 5\' 4"  (1.626 m)   Wt 199 lb (90.3 kg)   BMI 34.16 kg/m  Last Weight:  Wt Readings from Last 1 Encounters:  10/15/19 199 lb (90.3 kg)   Last Height:   Ht Readings from Last 1 Encounters:  10/15/19 5\' 4"  (1.626 m)     Physical exam: Exam: Gen: NAD, conversant, well nourised, obese, well groomed                     CV: RRR, no MRG. No Carotid Bruits. No peripheral edema, warm, nontender Eyes: Conjunctivae clear without exudates or hemorrhage  Neuro: Detailed Neurologic Exam  Speech:    Speech is normal; fluent and spontaneous with normal comprehension.  Cognition:    The patient is oriented to person, place, and time;     recent and remote memory intact;     language fluent;     normal attention, concentration,     fund of knowledge Cranial Nerves:    The pupils are equal, round, and reactive to light. The fundi are normal and spontaneous venous pulsations are present. Visual fields are full to finger confrontation. Extraocular movements are intact. Trigeminal sensation is intact and the muscles of mastication are normal. The face is symmetric. The palate elevates in the midline. Hearing intact. Voice is normal. Shoulder shrug is normal. The tongue has normal motion without fasciculations.   Coordination:    No dysmetria   Gait:  normal native gait  Motor Observation:    No asymmetry, no atrophy, and no involuntary movements noted. Tone:    Normal muscle tone.    Posture:    Posture is normal. normal erect    Strength:    Strength is V/V in the upper and lower limbs.      Sensation: intact to LT     Reflex Exam:  DTR's:    Deep tendon reflexes in the upper and lower extremities are normal bilaterally.   Toes:    The toes are downgoing bilaterally.   Clonus:    Clonus is absent.    Assessment/Plan:  Patient with chronic neck and occipital pain in  the setting of chronic neck pain and multiple cervical surgeries. Diagnosed with cervicogenic headache, lacks features suggestive of occipital neuralgia, MRI imaging performed  recently demonstrates no significant foraminal or central canal stenosis and she denies radicular symptoms however the MRI does show facet atrophy above the level of her fusion, she may benefit from cervical facet blocks, RFA C2-C3 occipital nerve or C4 bilaterally.  -She has been seen by pain clinic and is also going to a pain psychologist.  She was on chronic opioids twice a day(she said she stopped those, pain clinic would not prescribe).I explained I do agree that her best options would be what the pain clinic recommended: cervical facet blocks, RFA C2-C3 occipital nerve or C4 bilaterally.  We discussed following options:  1. Medications: Start Gabapentin, medrol dosepak. Other medications we can use: amitriptyline,cymbalta,lyrica, methocarbamol, flexeril, and there are other medications to try as well 2. Other options include botox, occipital or other neuro stimulator, medial branch blocks and ablation of the occipital nerves, 3. Dry needling, massage and heat, physical therapy, topical capsaicin, lidocaine patches over the counter 4. Occipital nerve blocks 5. I also recommended MRI of the brain, she declined at this time  Meds ordered this encounter  Medications  . gabapentin (NEURONTIN) 300 MG capsule    Sig: Take 1 capsule (300 mg total) by mouth 3 (three) times daily.    Dispense:  270 capsule    Refill:  3  . methylPREDNISolone (MEDROL DOSEPAK) 4 MG TBPK tablet    Sig: Take pills in the morning with food for 6 days. 6-5-4-3-2-1    Dispense:  21 tablet    Refill:  1   Discussed: To prevent or relieve headaches, try the following: Cool Compress. Lie down and place a cool compress on your head.  Avoid headache triggers. If certain foods or odors seem to have triggered your migraines in the past, avoid them. A  headache diary might help you identify triggers.  Include physical activity in your daily routine. Try a daily walk or other moderate aerobic exercise.  Manage stress. Find healthy ways to cope with the stressors, such as delegating tasks on your to-do list.  Practice relaxation techniques. Try deep breathing, yoga, massage and visualization.  Eat regularly. Eating regularly scheduled meals and maintaining a healthy diet might help prevent headaches. Also, drink plenty of fluids.  Follow a regular sleep schedule. Sleep deprivation might contribute to headaches Consider biofeedback. With this mind-body technique, you learn to control certain bodily functions -- such as muscle tension, heart rate and blood pressure -- to prevent headaches or reduce headache pain.    Proceed to emergency room if you experience new or worsening symptoms or symptoms do not resolve, if you have new neurologic symptoms or if headache is severe, or for any concerning symptom.   Provided education and documentation from American headache Society toolbox including articles on: chronic migraine medication overuse headache, chronic migraines, prevention of migraines, behavioral and other nonpharmacologic treatments for headache.   Cc: Orma Flaming, MD  Sarina Ill, MD  [facility] [address] [place], [place] [address]  Phone [phone] Fax [phone]

## 2019-10-15 ENCOUNTER — Ambulatory Visit (INDEPENDENT_AMBULATORY_CARE_PROVIDER_SITE_OTHER): Payer: No Typology Code available for payment source | Admitting: Neurology

## 2019-10-15 ENCOUNTER — Other Ambulatory Visit: Payer: Self-pay

## 2019-10-15 ENCOUNTER — Encounter: Payer: Self-pay | Admitting: Neurology

## 2019-10-15 VITALS — BP 130/83 | HR 69 | Temp 98.0°F | Ht 64.0 in | Wt 199.0 lb

## 2019-10-15 DIAGNOSIS — G4486 Cervicogenic headache: Secondary | ICD-10-CM

## 2019-10-15 DIAGNOSIS — R519 Headache, unspecified: Secondary | ICD-10-CM

## 2019-10-15 MED ORDER — METHYLPREDNISOLONE 4 MG PO TBPK: ORAL_TABLET | ORAL | 1 refills | Status: DC

## 2019-10-15 MED ORDER — GABAPENTIN 300 MG PO CAPS: 300.0000 mg | ORAL_CAPSULE | Freq: Three times a day (TID) | ORAL | 3 refills | Status: DC

## 2019-10-15 NOTE — Patient Instructions (Addendum)
Start Gabapentin Other medications we can use: amitriptyline,cymbalta,lyrica, methocarbamol, flexeril, and there are other medications to use Other options include botox, occipital stimulator, medial branch blocks and ablation of the occipital nerves, Dry needling, massage and heat, physical therapy, topical capsaicin, lidocaine patches over the counter Occipital nerve blocks  Occipital Nerve Block Patient Information  Description: The occipital nerves originate in the cervical (neck) spinal cord and travel upward through muscle and tissue to supply sensation to the back of the head and top of the scalp.  In addition, the nerves control some of the muscles of the scalp.  Occipital neuralgia is an irritation of these nerves which can cause headaches, numbness of the scalp, and neck discomfort.     The occipital nerve block will interrupt nerve transmission through these nerves and can relieve pain and spasm.  The block consists of insertion of a small needle under the skin in the back of the head to deposit local anesthetic (numbing medicine) and/or steroids around the nerve.  The entire block usually lasts less than 5 minutes.  Conditions which may be treated by occipital blocks:   Muscular pain and spasm of the scalp  Nerve irritation, back of the head  Headaches  Upper neck pain  Preparation for the injection:  1. Do not eat any solid food or dairy products within 8 hours of your appointment. 2. You may drink clear liquids up to 3 hours before appointment.  Clear liquids include water, black coffee, juice or soda.  No milk or cream please. 3. You may take your regular medication, including pain medications, with a sip of water before you appointment.  Diabetics should hold regular insulin (if taken separately) and take 1/2 normal NPH dose the morning of the procedure.  Carry some sugar containing items with you to your appointment. 4. A driver must accompany you and be prepared to drive  you home after your procedure. 5. Bring all your current medications with you. 6. An IV may be inserted and sedation may be given at the discretion of the physician. 7. A blood pressure cuff, EKG, and other monitors will often be applied during the procedure.  Some patients may need to have extra oxygen administered for a short period. 8. You will be asked to provide medical information, including your allergies and medications, prior to the procedure.  We must know immediately if you are taking blood thinners (like Coumadin/Warfarin) or if you are allergic to IV iodine contrast (dye).  We must know if you could possible be pregnant.  9. Do not wear a high collared shirt or turtleneck.  Tie long hair up in the back if possible.  Possible side-effects:   Bleeding from needle site  Infection (rare, may require surgery)  Nerve injury (rare)  Hair on back of neck can be tinged with iodine scrub (this will wash out)  Light-headedness (temporary)  Pain at injection site (several days)  Decreased blood pressure (rare, temporary)  Seizure (very rare)  Call if you experience:   Hives or difficulty breathing ( go to the emergency room)  Inflammation or drainage at the injection site(s)  Please note:  Although the local anesthetic injected can often make your painful muscles or headache feel good for several hours after the injection, the pain may return.  It takes 3-7 days for steroids to work.  You may not notice any pain relief for at least one week.  If effective, we will often do a series of injections spaced 3-6  weeks apart to maximally decrease your pain.  If you have any questions, please call [phone] [facility]    Cervicogenic Headache  A cervicogenic headache is a headache caused by a condition that affects the bones and tissues in your neck (cervical spine). In a cervicogenic headache, the pain moves from your neck to your head.  Most cervicogenic headaches start in the upper part of the neck with the first three cervical bones (cervical vertebrae). A cervicogenic headache is diagnosed when a cause can be found in the cervical spine and other causes of headaches can be ruled out. What are the causes? The most common cause of this condition is a traumatic injury to the cervical spine, such as whiplash. Other causes include:  Arthritis.  Broken bone (fracture).  Infection.  Tumor. What are the signs or symptoms? The most common symptoms are neck and head pain. The pain is often located on one side. In some cases, there may be head pain without neck pain. Pain may be felt in the neck, back or side of the head, face, or behind the eyes. Other symptoms include:  Limited movement in the neck.  Arm or shoulder pain. How is this diagnosed? This condition may be diagnosed based on:  Your symptoms.  A physical exam.  An injection that blocks nerve signals (nerve block).  Imaging tests, such as: ? X-rays. ? CT scan. ? MRI. How is this treated? Treatment for this condition may depend on the underlying condition. Treatment may include:  Medicines, such as: ? NSAIDs. ? Muscle relaxants.  Physical therapy.  Massage therapy.  Complementary therapies, such as: ? Biofeedback. ? Meditation. ? Acupuncture.  Nerve block injections.  Botulinum toxin injections. Your treatment plan may involve working with a pain management team that includes your primary health care provider, a pain management specialist, a neurologist, and a physical therapist. Follow these instructions at home:  Take over-the-counter and prescription medicines only as told by your health care provider.  Do exercises at home as told by your physical therapist.  Return to your normal activities as told by your health care provider. Ask your health care provider what activities are safe for you. Avoid activities that trigger your  headaches.  Maintain good neck support and posture at home and at work.  Keep all follow-up visits as told by your health care provider. This is important. Contact a health care provider if you have:  Headaches that are getting worse and happening more often.  Headaches with any of the following: ? Fever. ? Numbness. ? Weakness. ? Dizziness. ? Nausea or vomiting. Get help right away if:  You have a very sudden and severe headache. Summary  A cervicogenic headache is a headache caused by a condition that affects the bones and tissues in your cervical spine.  Your health care provider may diagnose this condition with a physical exam, a nerve block, and imaging tests.  Treatment may include medicine to reduce pain and inflammation, physical therapy, and nerve block injections.  Complementary therapies, such as acupuncture and meditation, may be added to other treatments.  Your treatment plan may involve working with a pain management team that includes your primary health care provider, a pain management specialist, a neurologist, and a physical therapist. This information is not intended to replace advice given to you by your health care provider. Make sure you discuss any questions you have with your health care provider. Document Revised: 10/15/2018 Document Reviewed: 07/05/2017  Elsevier Patient Education  [employer].   Gabapentin capsules or tablets What is this medicine? GABAPENTIN (GA ba pen tin) is used to control seizures in certain types of epilepsy. It is also used to treat certain types of nerve pain. This medicine may be used for other purposes; ask your health care provider or pharmacist if you have questions. COMMON BRAND NAME(S): Active-PAC with Gabapentin, Gabarone, Neurontin What should I tell my health care provider before I take this medicine? They need to know if you have any of these conditions:  history of drug abuse or alcohol abuse  problem  kidney disease  lung or breathing disease  suicidal thoughts, plans, or attempt; a previous suicide attempt by you or a family member  an unusual or allergic reaction to gabapentin, other medicines, foods, dyes, or preservatives  pregnant or trying to get pregnant  breast-feeding How should I use this medicine? Take this medicine by mouth with a glass of water. Follow the directions on the prescription label. You can take it with or without food. If it upsets your stomach, take it with food. Take your medicine at regular intervals. Do not take it more often than directed. Do not stop taking except on your doctor's advice. If you are directed to break the 600 or 800 mg tablets in half as part of your dose, the extra half tablet should be used for the next dose. If you have not used the extra half tablet within 28 days, it should be thrown away. A special MedGuide will be given to you by the pharmacist with each prescription and refill. Be sure to read this information carefully each time. Talk to your pediatrician regarding the use of this medicine in children. While this drug may be prescribed for children as young as 3 years for selected conditions, precautions do apply. Overdosage: If you think you have taken too much of this medicine contact a poison control center or emergency room at once. NOTE: This medicine is only for you. Do not share this medicine with others. What if I miss a dose? If you miss a dose, take it as soon as you can. If it is almost time for your next dose, take only that dose. Do not take double or extra doses. What may interact with this medicine? This medicine may interact with the following medications:  alcohol  antihistamines for allergy, cough, and cold  certain medicines for anxiety or sleep  certain medicines for depression like amitriptyline, fluoxetine, sertraline  certain medicines for seizures like phenobarbital, primidone  certain  medicines for stomach problems  general anesthetics like halothane, isoflurane, methoxyflurane, propofol  local anesthetics like lidocaine, pramoxine, tetracaine  medicines that relax muscles for surgery  narcotic medicines for pain  phenothiazines like chlorpromazine, mesoridazine, prochlorperazine, thioridazine This list may not describe all possible interactions. Give your health care provider a list of all the medicines, herbs, non-prescription drugs, or dietary supplements you use. Also tell them if you smoke, drink alcohol, or use illegal drugs. Some items may interact with your medicine. What should I watch for while using this medicine? Visit your doctor or health care provider for regular checks on your progress. You may want to keep a record at home of how you feel your condition is responding to treatment. You may want to share this information with your doctor or health care provider at each visit. You should contact your doctor or health care provider if your seizures get worse or if you  have any new types of seizures. Do not stop taking this medicine or any of your seizure medicines unless instructed by your doctor or health care provider. Stopping your medicine suddenly can increase your seizures or their severity. This medicine may cause serious skin reactions. They can happen weeks to months after starting the medicine. Contact your health care provider right away if you notice fevers or flu-like symptoms with a rash. The rash may be red or purple and then turn into blisters or peeling of the skin. Or, you might notice a red rash with swelling of the face, lips or lymph nodes in your neck or under your arms. Wear a medical identification bracelet or chain if you are taking this medicine for seizures, and carry a card that lists all your medications. You may get drowsy, dizzy, or have blurred vision. Do not drive, use machinery, or do anything that needs mental alertness until you  know how this medicine affects you. To reduce dizzy or fainting spells, do not sit or stand up quickly, especially if you are an older patient. Alcohol can increase drowsiness and dizziness. Avoid alcoholic drinks. Your mouth may get dry. Chewing sugarless gum or sucking hard candy, and drinking plenty of water will help. The use of this medicine may increase the chance of suicidal thoughts or actions. Pay special attention to how you are responding while on this medicine. Any worsening of mood, or thoughts of suicide or dying should be reported to your health care provider right away. Women who become pregnant while using this medicine may enroll in the [place] Pregnancy Registry by calling [phone]. This registry collects information about the safety of antiepileptic drug use during pregnancy. What side effects may I notice from receiving this medicine? Side effects that you should report to your doctor or health care professional as soon as possible:  allergic reactions like skin rash, itching or hives, swelling of the face, lips, or tongue  breathing problems  rash, fever, and swollen lymph nodes  redness, blistering, peeling or loosening of the skin, including inside the mouth  suicidal thoughts, mood changes Side effects that usually do not require medical attention (report to your doctor or health care professional if they continue or are bothersome):  dizziness  drowsiness  headache  nausea, vomiting  swelling of ankles, feet, hands  tiredness This list may not describe all possible side effects. Call your doctor for medical advice about side effects. You may report side effects to FDA at 1-800-FDA-1088. Where should I keep my medicine? Keep out of reach of children. This medicine may cause accidental overdose and death if it taken by other adults, children, or pets. Mix any unused medicine with a substance like cat litter or coffee grounds.  Then throw the medicine away in a sealed container like a sealed bag or a coffee can with a lid. Do not use the medicine after the expiration date. Store at room temperature between 15 and 30 degrees C (59 and 86 degrees F). NOTE: This sheet is a summary. It may not cover all possible information. If you have questions about this medicine, talk to your doctor, pharmacist, or health care provider.  2020 Elsevier/Gold Standard (2018-09-26 14:16:43)   Methylprednisolone tablets What is this medicine? METHYLPREDNISOLONE (meth ill pred NISS oh lone) is a corticosteroid. It is commonly used to treat inflammation of the skin, joints, lungs, and other organs. Common conditions treated include asthma, allergies, and arthritis. It is also used for  other conditions, such as blood disorders and diseases of the adrenal glands. This medicine may be used for other purposes; ask your health care provider or pharmacist if you have questions. COMMON BRAND NAME(S): Medrol, Medrol Dosepak What should I tell my health care provider before I take this medicine? They need to know if you have any of these conditions:  Cushing's syndrome  eye disease, vision problems  diabetes  glaucoma  heart disease  high blood pressure  infection (especially a virus infection such as chickenpox, cold sores, or herpes)  liver disease  mental illness  myasthenia gravis  osteoporosis  recently received or scheduled to receive a vaccine  seizures  stomach or intestine problems  thyroid disease  an unusual or allergic reaction to lactose, methylprednisolone, other medicines, foods, dyes, or preservatives  pregnant or trying to get pregnant  breast-feeding How should I use this medicine? Take this medicine by mouth with a glass of water. Follow the directions on the prescription label. Take this medicine with food. If you are taking this medicine once a day, take it in the morning. Do not take it more often  than directed. Do not suddenly stop taking your medicine because you may develop a severe reaction. Your doctor will tell you how much medicine to take. If your doctor wants you to stop the medicine, the dose may be slowly lowered over time to avoid any side effects. Talk to your pediatrician regarding the use of this medicine in children. Special care may be needed. Overdosage: If you think you have taken too much of this medicine contact a poison control center or emergency room at once. NOTE: This medicine is only for you. Do not share this medicine with others. What if I miss a dose? If you miss a dose, take it as soon as you can. If it is almost time for your next dose, talk to your doctor or health care professional. You may need to miss a dose or take an extra dose. Do not take double or extra doses without advice. What may interact with this medicine? Do not take this medicine with any of the following medications:  alefacept  echinacea  live virus vaccines  metyrapone  mifepristone This medicine may also interact with the following medications:  amphotericin B  aspirin and aspirin-like medicines  certain antibiotics like erythromycin, clarithromycin, troleandomycin  certain medicines for diabetes  certain medicines for fungal infections like ketoconazole  certain medicines for seizures like carbamazepine, phenobarbital, phenytoin  certain medicines that treat or prevent blood clots like warfarin  cholestyramine  cyclosporine  digoxin  diuretics  female hormones, like estrogens and birth control pills  isoniazid  NSAIDs, medicines for pain inflammation, like ibuprofen or naproxen  other medicines for myasthenia gravis  rifampin  vaccines This list may not describe all possible interactions. Give your health care provider a list of all the medicines, herbs, non-prescription drugs, or dietary supplements you use. Also tell them if you smoke, drink alcohol,  or use illegal drugs. Some items may interact with your medicine. What should I watch for while using this medicine? Tell your doctor or healthcare professional if your symptoms do not start to get better or if they get worse. Do not stop taking except on your doctor's advice. You may develop a severe reaction. Your doctor will tell you how much medicine to take. This medicine may increase your risk of getting an infection. Tell your doctor or health care professional if you are around  anyone with measles or chickenpox, or if you develop sores or blisters that do not heal properly. This medicine may increase blood sugar levels. Ask your healthcare provider if changes in diet or medicines are needed if you have diabetes. Tell your doctor or health care professional right away if you have any change in your eyesight. Using this medicine for a long time may increase your risk of low bone mass. Talk to your doctor about bone health. What side effects may I notice from receiving this medicine? Side effects that you should report to your doctor or health care professional as soon as possible:  allergic reactions like skin rash, itching or hives, swelling of the face, lips, or tongue  bloody or tarry stools  hallucination, loss of contact with reality  muscle cramps  muscle pain  palpitations  signs and symptoms of high blood sugar such as being more thirsty or hungry or having to urinate more than normal. You may also feel very tired or have blurry vision.  signs and symptoms of infection like fever or chills; cough; sore throat; pain or trouble passing urine Side effects that usually do not require medical attention (report to your doctor or health care professional if they continue or are bothersome):  changes in emotions or mood  constipation  diarrhea  excessive hair growth on the face or body  headache  nausea, vomiting  trouble sleeping  weight gain This list may not  describe all possible side effects. Call your doctor for medical advice about side effects. You may report side effects to FDA at 1-800-FDA-1088. Where should I keep my medicine? Keep out of the reach of children. Store at room temperature between 20 and 25 degrees C (68 and 77 degrees F). Throw away any unused medicine after the expiration date. NOTE: This sheet is a summary. It may not cover all possible information. If you have questions about this medicine, talk to your doctor, pharmacist, or health care provider.  2020 Elsevier/Gold Standard (2018-03-27 09:19:36)

## 2019-10-18 DIAGNOSIS — G4486 Cervicogenic headache: Secondary | ICD-10-CM | POA: Insufficient documentation

## 2019-11-05 ENCOUNTER — Other Ambulatory Visit: Payer: Self-pay | Admitting: Family Medicine

## 2019-11-05 MED ORDER — AMITRIPTYLINE HCL 10 MG PO TABS: 10.0000 mg | ORAL_TABLET | Freq: Every day | ORAL | 0 refills | Status: DC

## 2019-11-05 NOTE — Progress Notes (Signed)
Did not like gabapentin. Made her feel really bad and made her headache worse. We are going to do trial of amitriptyline at night. F/u with me in 1-3 months.  Orma Flaming, MD [place]

## 2020-04-28 ENCOUNTER — Other Ambulatory Visit: Payer: Self-pay

## 2020-04-28 ENCOUNTER — Encounter: Payer: Self-pay | Admitting: Family Medicine

## 2020-04-28 ENCOUNTER — Ambulatory Visit (INDEPENDENT_AMBULATORY_CARE_PROVIDER_SITE_OTHER): Payer: No Typology Code available for payment source | Admitting: Family Medicine

## 2020-04-28 VITALS — BP 134/88 | HR 80 | Temp 98.7°F | Ht 64.0 in | Wt 177.6 lb

## 2020-04-28 DIAGNOSIS — R591 Generalized enlarged lymph nodes: Secondary | ICD-10-CM | POA: Diagnosis not present

## 2020-04-28 LAB — CBC WITH DIFFERENTIAL/PLATELET
Absolute Monocytes: 260 cells/uL (ref 200–950)
Basophils Absolute: 31 cells/uL (ref 0–200)
Basophils Relative: 0.6 %
Eosinophils Absolute: 143 cells/uL (ref 15–500)
Eosinophils Relative: 2.8 %
HCT: 37.3 % (ref 35.0–45.0)
Hemoglobin: 12.5 g/dL (ref 11.7–15.5)
Lymphs Abs: 1341 cells/uL (ref 850–3900)
MCH: 28.9 pg (ref 27.0–33.0)
MCHC: 33.5 g/dL (ref 32.0–36.0)
MCV: 86.1 fL (ref 80.0–100.0)
MPV: 10.6 fL (ref 7.5–12.5)
Monocytes Relative: 5.1 %
Neutro Abs: 3325 cells/uL (ref 1500–7800)
Neutrophils Relative %: 65.2 %
Platelets: 201 10*3/uL (ref 140–400)
RBC: 4.33 10*6/uL (ref 3.80–5.10)
RDW: 11.8 % (ref 11.0–15.0)
Total Lymphocyte: 26.3 %
WBC: 5.1 10*3/uL (ref 3.8–10.8)

## 2020-04-28 MED ORDER — BACLOFEN 20 MG PO TABS: 20.0000 mg | ORAL_TABLET | Freq: Every day | ORAL | 1 refills | Status: DC

## 2020-04-28 NOTE — Patient Instructions (Addendum)
-  will check labs and give a few more weeks. If not getting better let me know so we can ultrasound.   -headaches Keep headache journal.  1. Limit use of pain relievers to no more than 2 days out of week to prevent risk of rebound or medication-overuse headache. 2.  Keep headache diary 3.  Exercise, hydration, caffeine cessation, sleep hygiene, monitor for and avoid triggers 4.  Consider:  magnesium citrate 400mg  daily, riboflavin 400mg  daily, and coenzyme Q10 100mg  three times daily   So good seeing you!

## 2020-04-28 NOTE — Progress Notes (Signed)
Patient: Christine Nash MRN: [id number] DOB: [birth year] PCP: Orma Flaming, MD     Subjective:  Chief Complaint  Patient presents with  . Adenopathy    HPI: The patient is a 49 y.o. female who presents today for swollen lymph nodes. Starting 6 weeks ago. She got sick 6 weeks ago with sore throat nasal congestion, fatigue. Her daughter and husband had same illness and all recovered. Since that time she has had intermittent swollen lymph nodes in her jaw. She denies any fever/chills. She has lost weight, but has been working on this. She thinks she may have had an enlarged lymph node in her right groin and right axillary area as well. No blood in her stool. No night sweats. No pain with chewing, no problems eating.      Review of Systems  Constitutional: Negative for chills, diaphoresis, fatigue, fever and unexpected weight change.  Respiratory: Negative for chest tightness and shortness of breath.   Cardiovascular: Negative for chest pain and palpitations.  Gastrointestinal: Negative for abdominal pain and blood in stool.  Endocrine: Negative for cold intolerance and heat intolerance.  Neurological: Positive for headaches.  Hematological: Positive for adenopathy. Does not bruise/bleed easily.    Allergies Patient is allergic to meperidine hcl, prochlorperazine edisylate, propranolol, trokendi xr [topiramate er], and phentermine.  Past Medical History Patient  has a past medical history of Anemia, Anxiety, Depression, Endometriosis, and Hyperlipidemia.  Surgical History Patient  has a past surgical history that includes Cervical spine surgery (2011,2012,2017); Cesarean section (4098,1191); Tubal ligation (2011); Cholecystectomy (N/A, 01/17/2019); Abdominal hysterectomy; Knee surgery; Bunionectomy; Shoulder surgery; and Supracervical abdominal hysterectomy (2013).  Family History Pateint's family history includes Bladder Cancer in her father; Breast cancer in her maternal  aunt; COPD in her mother; Diabetes in her maternal grandmother; Heart attack in her father; Melanoma in her father; Stroke in her mother.  Social History Patient  reports that she has never smoked. She has never used smokeless tobacco. She reports previous alcohol use. She reports that she does not use drugs.    Objective: Vitals:   04/28/20 1428  BP: 134/88  Pulse: 80  Temp: 98.7 F (37.1 C)  TempSrc: Temporal  SpO2: 100%  Weight: 177 lb 9.6 oz (80.6 kg)  Height: 5\' 4"  (1.626 m)    Body mass index is 30.48 kg/m.  Physical Exam Vitals reviewed.  Constitutional:      Appearance: Normal appearance. She is normal weight.  HENT:     Head: Normocephalic and atraumatic.  Neck:     Comments: Possibly mildly enlarged submandibular glands bilaterally, but do not feel pathologically enlarged.  Cardiovascular:     Rate and Rhythm: Normal rate and regular rhythm.     Heart sounds: Normal heart sounds.  Pulmonary:     Effort: Pulmonary effort is normal.  Musculoskeletal:     Cervical back: Normal range of motion and neck supple.  Lymphadenopathy:     Head:     Right side of head: Submandibular adenopathy present.     Left side of head: Submandibular adenopathy present.     Cervical: No cervical adenopathy.     Upper Body:     Right upper body: No supraclavicular, axillary or pectoral adenopathy.     Left upper body: No supraclavicular, axillary or pectoral adenopathy.     Lower Body: Right inguinal adenopathy present. Left inguinal adenopathy present.     Comments: Inguinal feel more physiological.   Neurological:     Mental Status: She is  alert.        Assessment/plan: 1. Lymphadenopathy Does not feel pathological. Checking labs, could be from lingering infection. Will start with labs and give this a little bit longer. If not resolving next step would be an ultrasound. She will let me know in a few weeks and if still present we will check imaging.  - SARS CoV2  Serology(COVID19) AB(IgG,IgM),Immunoassay; Future - Epstein-Barr virus VCA antibody panel - SARS CoV2 Serology(COVID19) AB(IgG,IgM),Immunoassay - CBC with Differential/Platelet; Future - CBC with Differential/Platelet    This visit occurred during the SARS-CoV-2 public health emergency.  Safety protocols were in place, including screening questions prior to the visit, additional usage of staff PPE, and extensive cleaning of exam room while observing appropriate contact time as indicated for disinfecting solutions.     Return if symptoms worsen or fail to improve.   Orma Flaming, MD [place]   04/28/2020

## 2020-04-30 LAB — EPSTEIN-BARR VIRUS VCA ANTIBODY PANEL
EBV NA IgG: 146 U/mL — ABNORMAL HIGH
EBV VCA IgG: 306 U/mL — ABNORMAL HIGH
EBV VCA IgM: <36 U/mL

## 2020-04-30 LAB — SARS COV-2 SEROLOGY(COVID-19)AB(IGG,IGM),IMMUNOASSAY
SARS CoV-2 AB IgG: NEGATIVE
SARS CoV-2 IgM: NEGATIVE

## 2020-07-21 ENCOUNTER — Encounter: Payer: No Typology Code available for payment source | Admitting: Family Medicine

## 2020-08-10 ENCOUNTER — Encounter: Payer: Self-pay | Admitting: Family Medicine

## 2020-08-10 ENCOUNTER — Ambulatory Visit (INDEPENDENT_AMBULATORY_CARE_PROVIDER_SITE_OTHER): Payer: BC Managed Care – PPO | Admitting: Family Medicine

## 2020-08-10 ENCOUNTER — Other Ambulatory Visit: Payer: Self-pay

## 2020-08-10 VITALS — BP 124/80 | HR 57 | Temp 98.2°F | Ht 64.0 in | Wt 162.4 lb

## 2020-08-10 DIAGNOSIS — Z1159 Encounter for screening for other viral diseases: Secondary | ICD-10-CM | POA: Diagnosis not present

## 2020-08-10 DIAGNOSIS — Z0000 Encounter for general adult medical examination without abnormal findings: Secondary | ICD-10-CM

## 2020-08-10 DIAGNOSIS — E559 Vitamin D deficiency, unspecified: Secondary | ICD-10-CM | POA: Diagnosis not present

## 2020-08-10 DIAGNOSIS — E782 Mixed hyperlipidemia: Secondary | ICD-10-CM

## 2020-08-10 LAB — CBC WITH DIFFERENTIAL/PLATELET
Basophils Absolute: 0 10*3/uL (ref 0.0–0.1)
Basophils Relative: 0.7 % (ref 0.0–3.0)
Eosinophils Absolute: 0.2 10*3/uL (ref 0.0–0.7)
Eosinophils Relative: 5.4 % — ABNORMAL HIGH (ref 0.0–5.0)
HCT: 36.2 % (ref 36.0–46.0)
Hemoglobin: 12.2 g/dL (ref 12.0–15.0)
Lymphocytes Relative: 32.6 % (ref 12.0–46.0)
Lymphs Abs: 1.3 10*3/uL (ref 0.7–4.0)
MCHC: 33.8 g/dL (ref 30.0–36.0)
MCV: 85.7 fl (ref 78.0–100.0)
Monocytes Absolute: 0.3 10*3/uL (ref 0.1–1.0)
Monocytes Relative: 7 % (ref 3.0–12.0)
Neutro Abs: 2.1 10*3/uL (ref 1.4–7.7)
Neutrophils Relative %: 54.3 % (ref 43.0–77.0)
Platelets: 221 10*3/uL (ref 150.0–400.0)
RBC: 4.22 Mil/uL (ref 3.87–5.11)
RDW: 13.3 % (ref 11.5–15.5)
WBC: 3.9 10*3/uL — ABNORMAL LOW (ref 4.0–10.5)

## 2020-08-10 LAB — LIPID PANEL
Cholesterol: 281 mg/dL — ABNORMAL HIGH (ref 0–200)
HDL: 70 mg/dL (ref >39.00)
LDL Cholesterol: 199 mg/dL — ABNORMAL HIGH (ref 0–99)
NonHDL: 210.94
Total CHOL/HDL Ratio: 4
Triglycerides: 58 mg/dL (ref 0.0–149.0)
VLDL: 11.6 mg/dL (ref 0.0–40.0)

## 2020-08-10 LAB — COMPREHENSIVE METABOLIC PANEL
ALT: 13 U/L (ref 0–35)
AST: 15 U/L (ref 0–37)
Albumin: 4.7 g/dL (ref 3.5–5.2)
Alkaline Phosphatase: 53 U/L (ref 39–117)
BUN: 11 mg/dL (ref 6–23)
CO2: 26 mEq/L (ref 19–32)
Calcium: 9.8 mg/dL (ref 8.4–10.5)
Chloride: 103 mEq/L (ref 96–112)
Creatinine, Ser: 0.83 mg/dL (ref 0.40–1.20)
GFR: 81.9 mL/min (ref >60.00)
Glucose, Bld: 83 mg/dL (ref 70–99)
Potassium: 3.6 mEq/L (ref 3.5–5.1)
Sodium: 137 mEq/L (ref 135–145)
Total Bilirubin: 0.9 mg/dL (ref 0.2–1.2)
Total Protein: 7.2 g/dL (ref 6.0–8.3)

## 2020-08-10 LAB — VITAMIN D 25 HYDROXY (VIT D DEFICIENCY, FRACTURES): VITD: 58.31 ng/mL (ref 30.00–100.00)

## 2020-08-10 LAB — TSH: TSH: 1.52 u[IU]/mL (ref 0.35–4.50)

## 2020-08-10 MED ORDER — BACLOFEN 20 MG PO TABS
20.0000 mg | ORAL_TABLET | Freq: Every day | ORAL | 1 refills | Status: DC
Start: 2020-08-10 — End: 2020-09-22

## 2020-08-10 NOTE — Patient Instructions (Signed)
Preventive Care 84-51 Years Old, Female Preventive care refers to lifestyle choices and visits with your health care provider that can promote health and wellness. This includes:  A yearly physical exam. This is also called an annual wellness visit.  Regular dental and eye exams.  Immunizations.  Screening for certain conditions.  Healthy lifestyle choices, such as: ? Eating a healthy diet. ? Getting regular exercise. ? Not using drugs or products that contain nicotine and tobacco. ? Limiting alcohol use. What can I expect for my preventive care visit? Physical exam Your health care provider will check your:  Height and weight. These may be used to calculate your BMI (body mass index). BMI is a measurement that tells if you are at a healthy weight.  Heart rate and blood pressure.  Body temperature.  Skin for abnormal spots. Counseling Your health care provider may ask you questions about your:  Past medical problems.  Family's medical history.  Alcohol, tobacco, and drug use.  Emotional well-being.  Home life and relationship well-being.  Sexual activity.  Diet, exercise, and sleep habits.  Work and work Statistician.  Access to firearms.  Method of birth control.  Menstrual cycle.  Pregnancy history. What immunizations do I need? Vaccines are usually given at various ages, according to a schedule. Your health care provider will recommend vaccines for you based on your age, medical history, and lifestyle or other factors, such as travel or where you work.   What tests do I need? Blood tests  Lipid and cholesterol levels. These may be checked every 5 years, or more often if you are over 51 years old.  Hepatitis C test.  Hepatitis B test. Screening  Lung cancer screening. You may have this screening every year starting at age 51 if you have a 30-pack-year history of smoking and currently smoke or have quit within the past 15 years.  Colorectal cancer  screening. ? All adults should have this screening starting at age 51 and continuing until age 17. ? Your health care provider may recommend screening at age 51 if you are at increased risk. ? You will have tests every 1-10 years, depending on your results and the type of screening test.  Diabetes screening. ? This is done by checking your blood sugar (glucose) after you have not eaten for a while (fasting). ? You may have this done every 1-3 years.  Mammogram. ? This may be done every 1-2 years. ? Talk with your health care provider about when you should start having regular mammograms. This may depend on whether you have a family history of breast cancer.  BRCA-related cancer screening. This may be done if you have a family history of breast, ovarian, tubal, or peritoneal cancers.  Pelvic exam and Pap test. ? This may be done every 3 years starting at age 51. ? Starting at age 51, this may be done every 5 years if you have a Pap test in combination with an HPV test. Other tests  STD (sexually transmitted disease) testing, if you are at risk.  Bone density scan. This is done to screen for osteoporosis. You may have this scan if you are at high risk for osteoporosis. Talk with your health care provider about your test results, treatment options, and if necessary, the need for more tests. Follow these instructions at home: Eating and drinking  Eat a diet that includes fresh fruits and vegetables, whole grains, lean protein, and low-fat dairy products.  Take vitamin and mineral supplements  as recommended by your health care provider.  Do not drink alcohol if: ? Your health care provider tells you not to drink. ? You are pregnant, may be pregnant, or are planning to become pregnant.  If you drink alcohol: ? Limit how much you have to 0-1 drink a day. ? Be aware of how much alcohol is in your drink. In the [place] one drink equals one 12 oz bottle of beer (355 mL), one 5 oz glass of  wine (148 mL), or one 1 oz glass of hard liquor (44 mL).   Lifestyle  Take daily care of your teeth and gums. Brush your teeth every morning and night with fluoride toothpaste. Floss one time each day.  Stay active. Exercise for at least 30 minutes 5 or more days each week.  Do not use any products that contain nicotine or tobacco, such as cigarettes, e-cigarettes, and chewing tobacco. If you need help quitting, ask your health care provider.  Do not use drugs.  If you are sexually active, practice safe sex. Use a condom or other form of protection to prevent STIs (sexually transmitted infections).  If you do not wish to become pregnant, use a form of birth control. If you plan to become pregnant, see your health care provider for a prepregnancy visit.  If told by your health care provider, take low-dose aspirin daily starting at age 51.  Find healthy ways to cope with stress, such as: ? Meditation, yoga, or listening to music. ? Journaling. ? Talking to a trusted person. ? Spending time with friends and family. Safety  Always wear your seat belt while driving or riding in a vehicle.  Do not drive: ? If you have been drinking alcohol. Do not ride with someone who has been drinking. ? When you are tired or distracted. ? While texting.  Wear a helmet and other protective equipment during sports activities.  If you have firearms in your house, make sure you follow all gun safety procedures. What's next?  Visit your health care provider once a year for an annual wellness visit.  Ask your health care provider how often you should have your eyes and teeth checked.  Stay up to date on all vaccines. This information is not intended to replace advice given to you by your health care provider. Make sure you discuss any questions you have with your health care provider. Document Revised: 03/29/2020 Document Reviewed: 03/06/2018 Elsevier Patient Education  2021 Elsevier Inc.  

## 2020-08-10 NOTE — Progress Notes (Signed)
Patient: Christine Nash MRN: [id number] DOB: [birth year] PCP: Orma Flaming, MD     Subjective:  Chief Complaint  Patient presents with  . Annual Exam  . Hyperlipidemia  . Anxiety    HPI: The patient is a 50 y.o. female who presents today for annual exam. She denies any changes to past medical history. There have been no recent hospitalizations. They are following a well balanced diet and exercise plan. Weight has been decreasing steadily.   Hyperlipidemia On statin due to LDL near 200, she stopped taking this 5 months ago as she has dramatically changed her diet and lost 35 pounds. She wants to see where she is at with her numbers.   Spinal stenosis of cervical region Needs refill of her baclofen.   Anxiety She is on no medication and doing well. Feels well controlled.   Immunization History  Administered Date(s) Administered  . Tdap 02/11/2018   Colonoscopy: 2012. 10 year follow up  Mammogram: 11/2018 Pap smear: 12/30/2017   Review of Systems  Constitutional: Negative for chills, fatigue and fever.  HENT: Negative for dental problem, ear pain, hearing loss and trouble swallowing.   Eyes: Negative for visual disturbance.  Respiratory: Negative for cough, chest tightness and shortness of breath.   Cardiovascular: Negative for chest pain, palpitations and leg swelling.  Gastrointestinal: Negative for abdominal pain, blood in stool, diarrhea and nausea.  Endocrine: Negative for cold intolerance, polydipsia, polyphagia and polyuria.  Genitourinary: Negative for dysuria and hematuria.  Musculoskeletal: Negative for arthralgias.  Skin: Negative for rash.  Neurological: Negative for dizziness and headaches.  Psychiatric/Behavioral: Negative for dysphoric mood and sleep disturbance. The patient is not nervous/anxious.     Allergies Patient is allergic to meperidine hcl, prochlorperazine edisylate, propranolol, trokendi xr [topiramate er], and phentermine.  Past  Medical History Patient  has a past medical history of Anemia, Anxiety, Depression, Endometriosis, and Hyperlipidemia.  Surgical History Patient  has a past surgical history that includes Cervical spine surgery (2011,2012,2017); Cesarean section (9833,8250); Tubal ligation (2011); Cholecystectomy (N/A, 01/17/2019); Abdominal hysterectomy; Knee surgery; Bunionectomy; Shoulder surgery; and Supracervical abdominal hysterectomy (2013).  Family History Pateint's family history includes Bladder Cancer in her father; Breast cancer in her maternal aunt; COPD in her mother; Diabetes in her maternal grandmother; Heart attack in her father; Melanoma in her father; Stroke in her mother.  Social History Patient  reports that she has never smoked. She has never used smokeless tobacco. She reports previous alcohol use. She reports that she does not use drugs.    Objective: Vitals:   08/10/20 0855  BP: 124/80  Pulse: (!) 57  Temp: 98.2 F (36.8 C)  TempSrc: Temporal  SpO2: 100%  Weight: 162 lb 6.4 oz (73.7 kg)  Height: 5\' 4"  (1.626 m)    Body mass index is 27.88 kg/m.  Physical Exam Vitals reviewed.  Constitutional:      Appearance: Normal appearance. She is well-developed, normal weight and well-nourished.  HENT:     Head: Normocephalic and atraumatic.     Right Ear: Tympanic membrane, ear canal and external ear normal.     Left Ear: Tympanic membrane, ear canal and external ear normal.     Nose: Nose normal.     Mouth/Throat:     Mouth: Oropharynx is clear and moist. Mucous membranes are moist.  Eyes:     Extraocular Movements: Extraocular movements intact and EOM normal.     Conjunctiva/sclera: Conjunctivae normal.     Pupils: Pupils are equal, round, and  reactive to light.  Neck:     Thyroid: No thyromegaly.     Vascular: No carotid bruit.  Cardiovascular:     Rate and Rhythm: Normal rate and regular rhythm.     Pulses: Normal pulses and intact distal pulses.     Heart sounds:  Normal heart sounds. No murmur heard.   Pulmonary:     Effort: Pulmonary effort is normal.     Breath sounds: Normal breath sounds.  Abdominal:     General: Abdomen is flat. Bowel sounds are normal. There is no distension.     Palpations: Abdomen is soft.     Tenderness: There is no abdominal tenderness.  Musculoskeletal:     Cervical back: Normal range of motion and neck supple.  Lymphadenopathy:     Cervical: No cervical adenopathy.  Skin:    General: Skin is warm and dry.     Capillary Refill: Capillary refill takes less than 2 seconds.     Findings: No rash.  Neurological:     General: No focal deficit present.     Mental Status: She is alert and oriented to person, place, and time.     Cranial Nerves: No cranial nerve deficit.     Coordination: Coordination normal.     Deep Tendon Reflexes: Reflexes normal.  Psychiatric:        Mood and Affect: Mood and affect and mood normal.        Behavior: Behavior normal.         [facility] Office Visit from 08/10/2020 in [place]  PHQ-2 Total Score 0      Assessment/plan: 1. Annual physical exam Routine fasting labs today. Hm reviewed. She will be due for pap this year, but otherwise UTD. im so proud of her for her weight loss and exercise. Doing wonderful! F/u in one year.  Patient counseling [x]    Nutrition: Stressed importance of moderation in sodium/caffeine intake, saturated fat and cholesterol, caloric balance, sufficient intake of fresh fruits, vegetables, fiber, calcium, iron, and 1 mg of folate supplement per day (for females capable of pregnancy).  [x]    Stressed the importance of regular exercise.   []    Substance Abuse: Discussed cessation/primary prevention of tobacco, alcohol, or other drug use; driving or other dangerous activities under the influence; availability of treatment for abuse.   [x]    Injury prevention: Discussed safety belts, safety helmets, smoke detector, smoking near  bedding or upholstery.   [x]    Sexuality: Discussed sexually transmitted diseases, partner selection, use of condoms, avoidance of unintended pregnancy  and contraceptive alternatives.  [x]    Dental health: Discussed importance of regular tooth brushing, flossing, and dental visits.  [x]    Health maintenance and immunizations reviewed. Please refer to Health maintenance section.    - CBC with Differential/Platelet - Comprehensive metabolic panel - TSH  2. Mixed hyperlipidemia Stopped her statin 5 months ago with her incredible weight loss and diet change. Will see how she is doing.  - Lipid panel  3. Vitamin D deficiency  - VITAMIN D 25 Hydroxy (Vit-D Deficiency, Fractures)  4. Encounter for hepatitis C screening test for low risk patient  - Hepatitis C antibody    This visit occurred during the SARS-CoV-2 public health emergency.  Safety protocols were in place, including screening questions prior to the visit, additional usage of staff PPE, and extensive cleaning of exam room while observing appropriate contact time as indicated for disinfecting solutions.     Return in  about 1 year (around 08/10/2021) for annual .     Orma Flaming, MD [place]  08/10/2020

## 2020-08-11 LAB — HEPATITIS C ANTIBODY
Hepatitis C Ab: NONREACTIVE
SIGNAL TO CUT-OFF: 0.01 (ref <1.00)

## 2020-08-15 ENCOUNTER — Emergency Department (HOSPITAL_BASED_OUTPATIENT_CLINIC_OR_DEPARTMENT_OTHER): Payer: BC Managed Care – PPO

## 2020-08-15 ENCOUNTER — Ambulatory Visit
Admission: EM | Admit: 2020-08-15 | Discharge: 2020-08-15 | Disposition: A | Discharge status: Home / Self Care | Payer: BC Managed Care – PPO

## 2020-08-15 ENCOUNTER — Emergency Department (HOSPITAL_BASED_OUTPATIENT_CLINIC_OR_DEPARTMENT_OTHER)
Admission: EM | Admit: 2020-08-15 | Discharge: 2020-08-15 | Disposition: A | Discharge status: Home / Self Care | Payer: BC Managed Care – PPO | Attending: Emergency Medicine | Admitting: Emergency Medicine

## 2020-08-15 ENCOUNTER — Other Ambulatory Visit: Payer: Self-pay | Admitting: Family Medicine

## 2020-08-15 ENCOUNTER — Encounter (HOSPITAL_BASED_OUTPATIENT_CLINIC_OR_DEPARTMENT_OTHER): Payer: Self-pay | Admitting: Emergency Medicine

## 2020-08-15 ENCOUNTER — Ambulatory Visit: Payer: BC Managed Care – PPO

## 2020-08-15 ENCOUNTER — Other Ambulatory Visit: Payer: Self-pay

## 2020-08-15 DIAGNOSIS — M25531 Pain in right wrist: Secondary | ICD-10-CM | POA: Diagnosis not present

## 2020-08-15 DIAGNOSIS — M79671 Pain in right foot: Secondary | ICD-10-CM | POA: Diagnosis not present

## 2020-08-15 DIAGNOSIS — R55 Syncope and collapse: Secondary | ICD-10-CM | POA: Diagnosis not present

## 2020-08-15 DIAGNOSIS — S52601A Unspecified fracture of lower end of right ulna, initial encounter for closed fracture: Secondary | ICD-10-CM

## 2020-08-15 DIAGNOSIS — R519 Headache, unspecified: Secondary | ICD-10-CM | POA: Diagnosis not present

## 2020-08-15 DIAGNOSIS — S52611A Displaced fracture of right ulna styloid process, initial encounter for closed fracture: Secondary | ICD-10-CM | POA: Insufficient documentation

## 2020-08-15 DIAGNOSIS — M25511 Pain in right shoulder: Secondary | ICD-10-CM | POA: Diagnosis not present

## 2020-08-15 DIAGNOSIS — W19XXXA Unspecified fall, initial encounter: Secondary | ICD-10-CM

## 2020-08-15 DIAGNOSIS — Z043 Encounter for examination and observation following other accident: Secondary | ICD-10-CM | POA: Diagnosis not present

## 2020-08-15 DIAGNOSIS — M545 Low back pain, unspecified: Secondary | ICD-10-CM | POA: Insufficient documentation

## 2020-08-15 DIAGNOSIS — W109XXA Fall (on) (from) unspecified stairs and steps, initial encounter: Secondary | ICD-10-CM | POA: Diagnosis not present

## 2020-08-15 DIAGNOSIS — R52 Pain, unspecified: Secondary | ICD-10-CM

## 2020-08-15 DIAGNOSIS — M25521 Pain in right elbow: Secondary | ICD-10-CM | POA: Diagnosis not present

## 2020-08-15 DIAGNOSIS — Y9301 Activity, walking, marching and hiking: Secondary | ICD-10-CM | POA: Insufficient documentation

## 2020-08-15 DIAGNOSIS — S6991XA Unspecified injury of right wrist, hand and finger(s), initial encounter: Secondary | ICD-10-CM | POA: Diagnosis not present

## 2020-08-15 DIAGNOSIS — E782 Mixed hyperlipidemia: Secondary | ICD-10-CM

## 2020-08-15 DIAGNOSIS — S0990XA Unspecified injury of head, initial encounter: Secondary | ICD-10-CM | POA: Diagnosis not present

## 2020-08-15 MED ORDER — MORPHINE SULFATE (PF) 4 MG/ML IV SOLN
4.0000 mg | Freq: Once | INTRAVENOUS | Status: AC
  Administered 2020-08-15: 4 mg via INTRAVENOUS
  Filled 2020-08-15: qty 1

## 2020-08-15 MED ORDER — FENTANYL CITRATE (PF) 100 MCG/2ML IJ SOLN
50.0000 ug | Freq: Once | INTRAMUSCULAR | Status: AC
  Administered 2020-08-15: 50 ug via INTRAVENOUS
  Filled 2020-08-15: qty 2

## 2020-08-15 MED ORDER — FENTANYL CITRATE (PF) 100 MCG/2ML IJ SOLN
50.0000 ug | INTRAMUSCULAR | Status: DC | PRN
  Administered 2020-08-15: 50 ug via INTRAVENOUS
  Filled 2020-08-15: qty 2

## 2020-08-15 MED ORDER — HYDROCODONE-ACETAMINOPHEN 5-325 MG PO TABS: 1.0000 | ORAL_TABLET | Freq: Four times a day (QID) | ORAL | 0 refills | Status: DC | PRN

## 2020-08-15 MED ORDER — OXYCODONE-ACETAMINOPHEN 5-325 MG PO TABS
1.0000 | ORAL_TABLET | Freq: Once | ORAL | Status: AC
  Administered 2020-08-15: 1 via ORAL
  Filled 2020-08-15: qty 1

## 2020-08-15 MED ORDER — ONDANSETRON HCL 4 MG/2ML IJ SOLN
4.0000 mg | Freq: Once | INTRAMUSCULAR | Status: AC
  Administered 2020-08-15: 4 mg via INTRAVENOUS
  Filled 2020-08-15: qty 2

## 2020-08-15 NOTE — ED Triage Notes (Addendum)
Pt fell on ice this am has rt wrist deformity , has rt foot pain and  Upper rt back pain  Can wiggle fingers slightly , has good pulse < 3 sec cap refill,m, foot has good pulse and she can wiggle them, states felt disoreinted  For a few sec, was seen at [facility] and had xrays

## 2020-08-15 NOTE — ED Notes (Signed)
Pt unable to tolerate xrays due to pain; to go to ED for eval

## 2020-08-15 NOTE — ED Provider Notes (Addendum)
[facility] EMERGENCY DEPARTMENT Provider Note   CSN: [id number] Arrival date & time: 08/15/20  0932     History Chief Complaint  Patient presents with  . Fall  . Wrist Pain    Elloise Nash is a 50 y.o. female.  HPI   50 year old female with a history of anemia, anxiety, depression, endometriosis, hyperlipidemia, who presents to the emergency department today for evaluation after a fall.  Patient states that she was walking down the stairs and reports when she slipped and fell down 4 stairs.  She sustained head trauma but does not think she lost consciousness.  States her husband came out to assist her and he also slipped on the ice falling onto her right arm.  She is complaining of severe right arm pain, right foot pain, upper and lower back pain, left hip/buttock pain, and a headache.  She was seen in urgent care prior to arrival and attempted to undergo x-rays but was having near syncopal episode secondary to pain so was sent here for further evaluation.  Past Medical History:  Diagnosis Date  . Anemia   . Anxiety   . Depression   . Endometriosis   . Hyperlipidemia     Patient Active Problem List   Diagnosis Date Noted  . Cervicogenic headache 10/18/2019  . Generalized anxiety disorder 02/11/2018  . Hormone replacement therapy (HRT) 02/11/2018  . Spinal stenosis of cervical region 02/11/2018  . Hyperlipidemia 02/11/2018  . Vitamin D deficiency 02/11/2018  . Attention deficit hyperactivity disorder, predominantly inattentive type 06/27/2011    Past Surgical History:  Procedure Laterality Date  . ABDOMINAL HYSTERECTOMY    . BUNIONECTOMY     2008  . CERVICAL SPINE SURGERY  2011,2012,2017   ACDF repair w/instrumentation  . CESAREAN SECTION  [id number]  . CHOLECYSTECTOMY N/A 01/17/2019   Procedure: LAPAROSCOPIC CHOLECYSTECTOMY WITH INTRAOPERATIVE CHOLANGIOGRAM;  Surgeon: Alphonsa Overall, MD;  Location: [facility];  Service: General;  Laterality: N/A;  .  KNEE SURGERY     left knee  . SHOULDER SURGERY     1996  . SUPRACERVICAL ABDOMINAL HYSTERECTOMY  2013  . TUBAL LIGATION  2011     OB History    Gravida  3   Para  3   Term  2   Preterm  1   AB      Living  3     SAB      IAB      Ectopic      Multiple      Live Births  3           Family History  Problem Relation Age of Onset  . Breast cancer Maternal Aunt   . Stroke Mother   . COPD Mother   . Bladder Cancer Father   . Melanoma Father   . Heart attack Father   . Diabetes Maternal Grandmother     Social History   Tobacco Use  . Smoking status: Never Smoker  . Smokeless tobacco: Never Used  Vaping Use  . Vaping Use: Never used  Substance Use Topics  . Alcohol use: Not Currently  . Drug use: Never    Home Medications Prior to Admission medications   Medication Sig Start Date End Date Taking? Authorizing Provider  HYDROcodone-acetaminophen (NORCO/VICODIN) 5-325 MG tablet Take 1 tablet by mouth every 6 (six) hours as needed. 08/15/20  Yes Beecher Furio S, PA-C  Ascorbic Acid (VITAMIN C) 1000 MG tablet Take 1,000 mg by mouth daily.  Patient not taking: Reported on 08/10/2020    [provider]  B Complex-Biotin-FA (B-COMPLEX PO) Take 1 capsule by mouth daily.    [provider]  baclofen (LIORESAL) 20 MG tablet Take 1 tablet (20 mg total) by mouth at bedtime. 08/10/20   Orma Flaming, MD  cholecalciferol (VITAMIN D) 25 MCG (1000 UT) tablet Take 2,000 Units by mouth daily.    [provider]    Allergies    Meperidine hcl, Prochlorperazine edisylate, Propranolol, Trokendi xr [topiramate er], Compazine [prochlorperazine], and Phentermine  Review of Systems   Review of Systems  Constitutional: Negative for fever.  HENT: Negative for dental problem.   Eyes: Negative for visual disturbance.  Respiratory: Negative for shortness of breath.   Cardiovascular: Negative for chest pain.  Gastrointestinal: Negative for abdominal  pain, nausea and vomiting.  Genitourinary: Negative for flank pain.  Musculoskeletal: Positive for back pain.       Right arm pain, right foot pain  Skin: Negative for color change and rash.  Neurological: Positive for headaches. Negative for seizures and syncope.       Head injury, no loc  All other systems reviewed and are negative.   Physical Exam Updated Vital Signs BP 122/78 (BP Location: Left Arm)   Pulse 64   Temp 98.1 F (36.7 C) (Oral)   Resp 20   Ht 5\' 4"  (1.626 m)   Wt 72.1 kg   SpO2 100%   BMI 27.29 kg/m   Physical Exam Vitals and nursing note reviewed.  Constitutional:      General: She is not in acute distress.    Appearance: She is well-developed and well-nourished.  HENT:     Head: Normocephalic and atraumatic.  Eyes:     Extraocular Movements: Extraocular movements intact.     Conjunctiva/sclera: Conjunctivae normal.     Pupils: Pupils are equal, round, and reactive to light.  Cardiovascular:     Rate and Rhythm: Normal rate and regular rhythm.     Heart sounds: No murmur heard.   Pulmonary:     Effort: Pulmonary effort is normal. No respiratory distress.     Breath sounds: Normal breath sounds.  Abdominal:     Palpations: Abdomen is soft.     Tenderness: There is no abdominal tenderness.  Musculoskeletal:        General: No edema.     Cervical back: Neck supple.     Comments: TTP to the right proximal humerus, right elbow, right forearm and right wrist with obvious swelling and deformity to the right forearm. NVI distally. ROM restricted. No midline cervical spine TTP. TTP to the thoracic, lumbar spine, left hip. Large area of ecchymosis to the left hip.  Skin:    General: Skin is warm and dry.  Neurological:     Mental Status: She is alert.     Comments: Alert, clear speech. Cranial nerves II-XII intact. Able to move all extremities with normal strength with exception of the RUE due to injury.   Psychiatric:        Mood and Affect: Mood and  affect normal.     ED Results / Procedures / Treatments   Labs (all labs ordered are listed, but only abnormal results are displayed) Labs Reviewed - No data to display  EKG None  Radiology DG Thoracic Spine 2 View  Result Date: 08/15/2020 CLINICAL DATA:  Fall. EXAM: THORACIC SPINE 2 VIEWS COMPARISON:  None. FINDINGS: There is no evidence of thoracic spine fracture. Alignment  is normal. No other significant bone abnormalities are identified. ACDF lower cervical spine. IMPRESSION: Negative. Electronically Signed   By: Franchot Gallo M.D.   On: 08/15/2020 11:45   DG Lumbar Spine Complete  Result Date: 08/15/2020 CLINICAL DATA:  Fall.  Back pain. EXAM: LUMBAR SPINE - COMPLETE 4+ VIEW COMPARISON:  None. FINDINGS: Normal alignment.  No fracture. Disc spaces maintained.  Mild facet degeneration L5-S1. IMPRESSION: Negative for lumbar fracture. Electronically Signed   By: Franchot Gallo M.D.   On: 08/15/2020 11:44   DG Shoulder Right  Result Date: 08/15/2020 CLINICAL DATA:  Shoulder pain after falling. EXAM: RIGHT SHOULDER - 2+ VIEW COMPARISON:  None. FINDINGS: The bones appear mildly demineralized. There is no evidence of acute fracture or dislocation. The subacromial space is narrowed, suspicious for a chronic rotator cuff tear. Moderate acromioclavicular and mild glenohumeral degenerative changes are present. There are postsurgical changes in the lower cervical spine. IMPRESSION: No acute osseous findings. Narrowed subacromial space suspicious for a chronic rotator cuff tear. Electronically Signed   By: Richardean Sale M.D.   On: 08/15/2020 11:44   DG Elbow Complete Right  Result Date: 08/15/2020 CLINICAL DATA:  Fall, pain EXAM: RIGHT ELBOW - COMPLETE 3+ VIEW COMPARISON:  None. FINDINGS: There is no evidence of fracture, dislocation, or joint effusion. There is no evidence of arthropathy or other focal bone abnormality. Soft tissues are unremarkable. IMPRESSION: No fracture or dislocation of the  right elbow. No elbow joint effusion. Electronically Signed   By: Eddie Candle M.D.   On: 08/15/2020 11:45   DG Forearm Right  Result Date: 08/15/2020 CLINICAL DATA:  Fall. EXAM: RIGHT FOREARM - 2 VIEW COMPARISON:  None. FINDINGS: There is no evidence of fracture or other focal bone lesions. Soft tissues are unremarkable. IMPRESSION: Negative. Electronically Signed   By: Marijo Conception M.D.   On: 08/15/2020 11:43   DG Wrist Complete Right  Result Date: 08/15/2020 CLINICAL DATA:  Fall EXAM: RIGHT WRIST - COMPLETE 3+ VIEW COMPARISON:  None. FINDINGS: Avulsion fracture of the ulnar styloid. No fracture of the radius. No significant degenerative change. IMPRESSION: Avulsion fracture of the ulnar styloid. Electronically Signed   By: Franchot Gallo M.D.   On: 08/15/2020 11:43   CT Head Wo Contrast  Result Date: 08/15/2020 CLINICAL DATA:  Fall today.  Head injury. EXAM: CT HEAD WITHOUT CONTRAST TECHNIQUE: Contiguous axial images were obtained from the base of the skull through the vertex without intravenous contrast. COMPARISON:  None. FINDINGS: Brain: No evidence of acute infarction, hemorrhage, hydrocephalus, extra-axial collection or mass lesion/mass effect. Vascular: Negative for hyperdense vessel. Skull: Negative Sinuses/Orbits: Paranasal sinuses clear.  Negative orbit Other: None IMPRESSION: Negative CT head Electronically Signed   By: Franchot Gallo M.D.   On: 08/15/2020 11:12   DG Foot Complete Right  Result Date: 08/15/2020 CLINICAL DATA:  Fall EXAM: RIGHT FOOT COMPLETE - 3+ VIEW COMPARISON:  None. FINDINGS: There is no evidence of fracture or dislocation. There is no evidence of arthropathy or other focal bone abnormality. Soft tissues are unremarkable. IMPRESSION: Negative. Electronically Signed   By: Franchot Gallo M.D.   On: 08/15/2020 11:46   DG HIP UNILAT WITH PELVIS 2-3 VIEWS RIGHT  Result Date: 08/15/2020 CLINICAL DATA:  Fall EXAM: DG HIP (WITH OR WITHOUT PELVIS) 2-3V RIGHT COMPARISON:   None. FINDINGS: There is no evidence of hip fracture or dislocation. There is no evidence of arthropathy or other focal bone abnormality. IMPRESSION: Negative. Electronically Signed   By: Juanda Crumble  Carlis Abbott M.D.   On: 08/15/2020 11:45    Procedures Procedures   SPLINT APPLICATION Date/Time: 0000000 PM Authorized by: Rodney Booze Consent: Verbal consent obtained. Risks and benefits: risks, benefits and alternatives were discussed Consent given by: patient Splint applied by: orthopedic technician Location details: RUE Splint type: ulnar gutter Supplies used: orthoglass, ace wrap Post-procedure: The splinted body part was neurovascularly unchanged following the procedure. Patient tolerance: Patient tolerated the procedure well with no immediate complications.   Medications Ordered in ED Medications  fentaNYL (SUBLIMAZE) injection 50 mcg (50 mcg Intravenous Given 08/15/20 1144)  oxyCODONE-acetaminophen (PERCOCET/ROXICET) 5-325 MG per tablet 1 tablet (has no administration in time range)  fentaNYL (SUBLIMAZE) injection 50 mcg (50 mcg Intravenous Given 08/15/20 1047)  ondansetron (ZOFRAN) injection 4 mg (4 mg Intravenous Given 08/15/20 1044)  morphine 4 MG/ML injection 4 mg (4 mg Intravenous Given 08/15/20 1207)    ED Course  I have reviewed the triage vital signs and the nursing notes.  Pertinent labs & imaging results that were available during my care of the patient were reviewed by me and considered in my medical decision making (see chart for details).    MDM Rules/Calculators/A&P                          50 y/o female presenting for eval after mechanical fall after slipping on ice. Also with subsequent injury that occurred after her husband fell on her in attempt to help her.   Reviewed/interpreted imaging CT head - Negative CT head Xray right shoulder - No acute osseous findings. Narrowed subacromial space suspicious for a chronic rotator cuff tear. Xray right elbow - No fracture or  dislocation of the right elbow. No elbow joint effusion. Xray right forearm - Negative. Xray right wrist - Avulsion fracture of the ulnar styloid. Xray right foot - Negative. Xray thoracic spine - Negative. Xray lumbar spine - Negative for lumbar fracture. Xray pelvis/left hip - Negative  Imaging significant for ulnar styloid avulsion fracture. Pt place in ulnar gutter and sling. Will give f/u with hand surgery. Will give rx for pain meds. Advised on return precautions. She voices understanding of the plan and reasons to return. All questions answered, stable for d/c.   Final Clinical Impression(s) / ED Diagnoses Final diagnoses:  Fall, initial encounter  Closed displaced fracture of styloid process of right ulna, initial encounter    Rx / DC Orders ED Discharge Orders         Ordered    HYDROcodone-acetaminophen (NORCO/VICODIN) 5-325 MG tablet  Every 6 hours PRN        08/15/20 1301           Garnet Chatmon S, PA-C 08/15/20 1220    Skylene Deremer S, PA-C 08/15/20 1309    Fredia Sorrow, MD 08/18/20 [phone]

## 2020-08-15 NOTE — ED Triage Notes (Addendum)
Pt c/o right wrist/arm pain. Pt fell on ice this morning at home. Pt has severe pain to right forearm and wrist. Pt has decreased ROM to arm/hand/fingers.

## 2020-08-15 NOTE — Discharge Instructions (Signed)
I am directing you to be evaluated at [facility] Emergency Department  as did your level of pain were unable to obtain necessary imaging to diagnose and manage any possible fractures.  You also nearly passed out twice while trying to obtain images due to level of pain unfortunately we do not have pain medication here in clinic to manage her level of pain.  Go immediately to [facility] ER for further evaluation and management.

## 2020-08-15 NOTE — ED Provider Notes (Signed)
[facility]    CSN: [id number] Arrival date & time: 08/15/20  0815      History   Chief Complaint Chief Complaint  Patient presents with  . Fall    08/15/20  . Arm Injury    right    HPI Christine Nash is a 50 y.o. female.   HPI  Patient presents today for evaluation of right upper extremity injury following a slip and fall as a result of icy stairs. Injury occurred less than 1 hours ago. Patient is unable to fully extend her right forearm, no ROM of right wrist, unable to extend fingers, and complains of paraesthesia like pain involving the lower humeral/elbow region of extremity. She has not taken any medication for pain.  She endorses the sensation that she is going to pass out due to level of pain.  However she declines any Toradol, ibuprofen or Tylenol offered here in clinic.   Past Medical History:  Diagnosis Date  . Anemia   . Anxiety   . Depression   . Endometriosis   . Hyperlipidemia     Patient Active Problem List   Diagnosis Date Noted  . Cervicogenic headache 10/18/2019  . Generalized anxiety disorder 02/11/2018  . Hormone replacement therapy (HRT) 02/11/2018  . Spinal stenosis of cervical region 02/11/2018  . Hyperlipidemia 02/11/2018  . Vitamin D deficiency 02/11/2018  . Attention deficit hyperactivity disorder, predominantly inattentive type 06/27/2011    Past Surgical History:  Procedure Laterality Date  . ABDOMINAL HYSTERECTOMY    . BUNIONECTOMY     2008  . CERVICAL SPINE SURGERY  2011,2012,2017   ACDF repair w/instrumentation  . CESAREAN SECTION  [id number]  . CHOLECYSTECTOMY N/A 01/17/2019   Procedure: LAPAROSCOPIC CHOLECYSTECTOMY WITH INTRAOPERATIVE CHOLANGIOGRAM;  Surgeon: Alphonsa Overall, MD;  Location: [facility];  Service: General;  Laterality: N/A;  . KNEE SURGERY     left knee  . SHOULDER SURGERY     1996  . SUPRACERVICAL ABDOMINAL HYSTERECTOMY  2013  . TUBAL LIGATION  2011    OB History    Gravida  3   Para  3    Term  2   Preterm  1   AB      Living  3     SAB      IAB      Ectopic      Multiple      Live Births  3            Home Medications    Prior to Admission medications   Medication Sig Start Date End Date Taking? Authorizing Provider  B Complex-Biotin-FA (B-COMPLEX PO) Take 1 capsule by mouth daily.   Yes [provider]  baclofen (LIORESAL) 20 MG tablet Take 1 tablet (20 mg total) by mouth at bedtime. 08/10/20  Yes Orma Flaming, MD  cholecalciferol (VITAMIN D) 25 MCG (1000 UT) tablet Take 2,000 Units by mouth daily.   Yes [provider]  Ascorbic Acid (VITAMIN C) 1000 MG tablet Take 1,000 mg by mouth daily. Patient not taking: Reported on 08/10/2020    [provider]    Family History Family History  Problem Relation Age of Onset  . Breast cancer Maternal Aunt   . Stroke Mother   . COPD Mother   . Bladder Cancer Father   . Melanoma Father   . Heart attack Father   . Diabetes Maternal Grandmother     Social History Social History   Tobacco Use  .  Smoking status: Never Smoker  . Smokeless tobacco: Never Used  Vaping Use  . Vaping Use: Never used  Substance Use Topics  . Alcohol use: Not Currently  . Drug use: Never     Allergies   Meperidine hcl, Prochlorperazine edisylate, Propranolol, Trokendi xr [topiramate er], Compazine [prochlorperazine], and Phentermine   Review of Systems Review of Systems Pertinent negatives listed in HPI  Physical Exam Triage Vital Signs ED Triage Vitals  Enc Vitals Group     BP 08/15/20 0817 119/81     Pulse Rate 08/15/20 0817 80     Resp 08/15/20 0817 19     Temp 08/15/20 0817 97.9 F (36.6 C)     Temp src --      SpO2 08/15/20 0817 99 %     Weight 08/15/20 0815 159 lb (72.1 kg)     Height 08/15/20 0815 5\' 4"  (1.626 m)     Head Circumference --      Peak Flow --      Pain Score 08/15/20 0815 10     Pain Loc --      Pain Edu? --      Excl. in [place]? --    No data  found.  Updated Vital Signs BP 119/81 (BP Location: Left Arm)   Pulse 80   Temp 97.9 F (36.6 C)   Resp 19   Ht 5\' 4"  (1.626 m)   Wt 159 lb (72.1 kg)   SpO2 99%   BMI 27.29 kg/m   Visual Acuity Right Eye Distance:   Left Eye Distance:   Bilateral Distance:    Right Eye Near:   Left Eye Near:    Bilateral Near:     Physical Exam Constitutional:      Appearance: Normal appearance.  Eyes:     Pupils: Pupils are equal, round, and reactive to light.  Cardiovascular:     Rate and Rhythm: Normal rate and regular rhythm.  Pulmonary:     Effort: Pulmonary effort is normal.     Breath sounds: Normal breath sounds.  Musculoskeletal:     Right upper arm: Swelling present.     Right elbow: Decreased range of motion.     Right forearm: Swelling present.     Right wrist: Deformity, tenderness and bony tenderness present. Decreased range of motion.     Comments: Palpable tenderness along the radial and mid forearm. Ecchymosis present.  Skin:    Capillary Refill: Capillary refill takes less than 2 seconds.     Coloration: Skin is pale.  Neurological:     General: No focal deficit present.     Mental Status: She is alert.  Psychiatric:        Mood and Affect: Mood is anxious.     UC Treatments / Results  Labs (all labs ordered are listed, but only abnormal results are displayed) Labs Reviewed - No data to display  EKG   Radiology No results found.  Procedures Procedures (including critical care time)  Medications Ordered in UC Medications - No data to display  Initial Impression / Assessment and Plan / UC Course  I have reviewed the triage vital signs and the nursing notes.  Pertinent labs & imaging results that were available during my care of the patient were reviewed by me and considered in my medical decision making (see chart for details).     Patient sent to radiology to obtain images patient became lethargic nearly passing out therefore was unable to  obtain images.  She complained of 12 out of 10 pain given that we do not store narcotics here in clinic patient felt she needs pain management to be able to comply with diagnostic testing.  Offered EMS however patient is currently here in clinic with her husband she advised that her husband will take her directly to [facility] for further evaluation and management.  Patient was transported to the car in wheelchair stable at discharge. Final Clinical Impressions(s) / UC Diagnoses   Final diagnoses:  Near syncope  Severe pain  Fall, initial encounter     Discharge Instructions     I am directing you to be evaluated at [facility] Emergency Department  as did your level of pain were unable to obtain necessary imaging to diagnose and manage any possible fractures.  You also nearly passed out twice while trying to obtain images due to level of pain unfortunately we do not have pain medication here in clinic to manage her level of pain.  Go immediately to [facility] ER for further evaluation and management.    ED Prescriptions    None     PDMP not reviewed this encounter.   Scot Jun, FNP 08/15/20 [phone]

## 2020-08-15 NOTE — ED Notes (Signed)
Pt in xray for MANY xrays and CTs

## 2020-08-15 NOTE — Discharge Instructions (Addendum)
You may alternate taking Tylenol and Naproxen as needed for pain control. You may take Naproxen twice daily as directed on your discharge paperwork and you may take  [phone] mg of Tylenol every 6 hours. Do not exceed 4000 mg of Tylenol daily as this can lead to liver damage. Also, make sure to take Naproxen with meals as it can cause an upset stomach. Do not take other NSAIDs while taking Naproxen such as (Aleve, Ibuprofen, Aspirin, Celebrex, etc) and do not take more than the prescribed dose as this can lead to ulcers and bleeding in your GI tract. You may use warm and cold compresses to help with your symptoms.   Prescription given for Norco. Take medication as directed and do not operate machinery, drive a car, or work while taking this medication as it can make you drowsy.   Please follow up with the hand surgeon, Dr. Silverio Lay within the next 5-7 days for re-evaluation and further treatment of your symptoms.   Please return to the ER sooner if you have any new or worsening symptoms.

## 2020-08-16 ENCOUNTER — Encounter (HOSPITAL_BASED_OUTPATIENT_CLINIC_OR_DEPARTMENT_OTHER): Payer: Self-pay | Admitting: Plastic Surgery

## 2020-08-16 ENCOUNTER — Other Ambulatory Visit (HOSPITAL_COMMUNITY)
Admission: RE | Admit: 2020-08-16 | Discharge: 2020-08-16 | Disposition: A | Discharge status: Home / Self Care | Payer: BC Managed Care – PPO | Source: Ambulatory Visit | Attending: Plastic Surgery | Admitting: Plastic Surgery

## 2020-08-16 ENCOUNTER — Other Ambulatory Visit: Payer: Self-pay

## 2020-08-16 DIAGNOSIS — Z20822 Contact with and (suspected) exposure to covid-19: Secondary | ICD-10-CM | POA: Insufficient documentation

## 2020-08-16 DIAGNOSIS — Z833 Family history of diabetes mellitus: Secondary | ICD-10-CM | POA: Diagnosis not present

## 2020-08-16 DIAGNOSIS — Z8249 Family history of ischemic heart disease and other diseases of the circulatory system: Secondary | ICD-10-CM | POA: Diagnosis not present

## 2020-08-16 DIAGNOSIS — Z8052 Family history of malignant neoplasm of bladder: Secondary | ICD-10-CM | POA: Diagnosis not present

## 2020-08-16 DIAGNOSIS — Z823 Family history of stroke: Secondary | ICD-10-CM | POA: Diagnosis not present

## 2020-08-16 DIAGNOSIS — Z888 Allergy status to other drugs, medicaments and biological substances status: Secondary | ICD-10-CM | POA: Diagnosis not present

## 2020-08-16 DIAGNOSIS — Z803 Family history of malignant neoplasm of breast: Secondary | ICD-10-CM | POA: Diagnosis not present

## 2020-08-16 DIAGNOSIS — S52611A Displaced fracture of right ulna styloid process, initial encounter for closed fracture: Secondary | ICD-10-CM | POA: Diagnosis not present

## 2020-08-16 DIAGNOSIS — Z825 Family history of asthma and other chronic lower respiratory diseases: Secondary | ICD-10-CM | POA: Diagnosis not present

## 2020-08-16 DIAGNOSIS — S63014A Dislocation of distal radioulnar joint of right wrist, initial encounter: Secondary | ICD-10-CM | POA: Diagnosis not present

## 2020-08-16 DIAGNOSIS — Z808 Family history of malignant neoplasm of other organs or systems: Secondary | ICD-10-CM | POA: Diagnosis not present

## 2020-08-16 DIAGNOSIS — Z01812 Encounter for preprocedural laboratory examination: Secondary | ICD-10-CM | POA: Insufficient documentation

## 2020-08-16 DIAGNOSIS — W19XXXA Unspecified fall, initial encounter: Secondary | ICD-10-CM | POA: Diagnosis not present

## 2020-08-16 LAB — SARS CORONAVIRUS 2 (TAT 6-24 HRS): SARS Coronavirus 2: NEGATIVE

## 2020-08-18 ENCOUNTER — Ambulatory Visit (HOSPITAL_BASED_OUTPATIENT_CLINIC_OR_DEPARTMENT_OTHER): Payer: BC Managed Care – PPO | Admitting: Anesthesiology

## 2020-08-18 ENCOUNTER — Ambulatory Visit (HOSPITAL_BASED_OUTPATIENT_CLINIC_OR_DEPARTMENT_OTHER)
Admission: RE | Admit: 2020-08-18 | Discharge: 2020-08-18 | Disposition: A | Discharge status: Home / Self Care | Payer: BC Managed Care – PPO | Attending: Plastic Surgery | Admitting: Plastic Surgery

## 2020-08-18 ENCOUNTER — Encounter (HOSPITAL_BASED_OUTPATIENT_CLINIC_OR_DEPARTMENT_OTHER)
Admission: RE | Disposition: A | Discharge status: Home / Self Care | Payer: Self-pay | Source: Home / Self Care | Attending: Plastic Surgery

## 2020-08-18 ENCOUNTER — Other Ambulatory Visit: Payer: Self-pay

## 2020-08-18 ENCOUNTER — Encounter (HOSPITAL_BASED_OUTPATIENT_CLINIC_OR_DEPARTMENT_OTHER): Payer: Self-pay | Admitting: Plastic Surgery

## 2020-08-18 ENCOUNTER — Other Ambulatory Visit: Payer: Self-pay | Admitting: Plastic Surgery

## 2020-08-18 DIAGNOSIS — Z825 Family history of asthma and other chronic lower respiratory diseases: Secondary | ICD-10-CM | POA: Insufficient documentation

## 2020-08-18 DIAGNOSIS — Z888 Allergy status to other drugs, medicaments and biological substances status: Secondary | ICD-10-CM | POA: Diagnosis not present

## 2020-08-18 DIAGNOSIS — Z823 Family history of stroke: Secondary | ICD-10-CM | POA: Insufficient documentation

## 2020-08-18 DIAGNOSIS — G8918 Other acute postprocedural pain: Secondary | ICD-10-CM | POA: Diagnosis not present

## 2020-08-18 DIAGNOSIS — F418 Other specified anxiety disorders: Secondary | ICD-10-CM | POA: Diagnosis not present

## 2020-08-18 DIAGNOSIS — W19XXXA Unspecified fall, initial encounter: Secondary | ICD-10-CM | POA: Diagnosis not present

## 2020-08-18 DIAGNOSIS — Z8052 Family history of malignant neoplasm of bladder: Secondary | ICD-10-CM | POA: Insufficient documentation

## 2020-08-18 DIAGNOSIS — Z833 Family history of diabetes mellitus: Secondary | ICD-10-CM | POA: Diagnosis not present

## 2020-08-18 DIAGNOSIS — Z803 Family history of malignant neoplasm of breast: Secondary | ICD-10-CM | POA: Insufficient documentation

## 2020-08-18 DIAGNOSIS — Z808 Family history of malignant neoplasm of other organs or systems: Secondary | ICD-10-CM | POA: Diagnosis not present

## 2020-08-18 DIAGNOSIS — Z20822 Contact with and (suspected) exposure to covid-19: Secondary | ICD-10-CM | POA: Diagnosis not present

## 2020-08-18 DIAGNOSIS — E559 Vitamin D deficiency, unspecified: Secondary | ICD-10-CM | POA: Diagnosis not present

## 2020-08-18 DIAGNOSIS — Z8249 Family history of ischemic heart disease and other diseases of the circulatory system: Secondary | ICD-10-CM | POA: Diagnosis not present

## 2020-08-18 DIAGNOSIS — S52611A Displaced fracture of right ulna styloid process, initial encounter for closed fracture: Secondary | ICD-10-CM | POA: Diagnosis not present

## 2020-08-18 DIAGNOSIS — S63286A Dislocation of proximal interphalangeal joint of right little finger, initial encounter: Secondary | ICD-10-CM | POA: Diagnosis not present

## 2020-08-18 DIAGNOSIS — S63014A Dislocation of distal radioulnar joint of right wrist, initial encounter: Secondary | ICD-10-CM | POA: Diagnosis not present

## 2020-08-18 DIAGNOSIS — M4802 Spinal stenosis, cervical region: Secondary | ICD-10-CM | POA: Diagnosis not present

## 2020-08-18 HISTORY — DX: Nausea with vomiting, unspecified: R11.2

## 2020-08-18 HISTORY — DX: Nausea with vomiting, unspecified: Z98.890

## 2020-08-18 HISTORY — PX: PERCUTANEOUS PINNING: SHX2209

## 2020-08-18 HISTORY — DX: Other complications of anesthesia, initial encounter: T88.59XA

## 2020-08-18 HISTORY — DX: Displaced fracture of right ulna styloid process, initial encounter for closed fracture: S52.611A

## 2020-08-18 HISTORY — PX: ORIF WRIST FRACTURE: SHX2133

## 2020-08-18 SURGERY — PINNING, EXTREMITY, PERCUTANEOUS
Anesthesia: Monitor Anesthesia Care | Site: Wrist | Laterality: Right

## 2020-08-18 MED ORDER — LACTATED RINGERS IV SOLN: INTRAVENOUS | Status: DC

## 2020-08-18 MED ORDER — DEXAMETHASONE SODIUM PHOSPHATE 10 MG/ML IJ SOLN
INTRAMUSCULAR | Status: DC | PRN
  Administered 2020-08-18: 10 mg

## 2020-08-18 MED ORDER — FENTANYL CITRATE (PF) 100 MCG/2ML IJ SOLN
100.0000 ug | Freq: Once | INTRAMUSCULAR | Status: AC
  Administered 2020-08-18: 100 ug via INTRAVENOUS

## 2020-08-18 MED ORDER — CEFAZOLIN SODIUM-DEXTROSE 2-4 GM/100ML-% IV SOLN
2.0000 g | INTRAVENOUS | Status: AC
  Administered 2020-08-18: 2 g via INTRAVENOUS

## 2020-08-18 MED ORDER — KETOROLAC TROMETHAMINE 30 MG/ML IJ SOLN: 30.0000 mg | Freq: Once | INTRAMUSCULAR | Status: DC | PRN

## 2020-08-18 MED ORDER — OXYCODONE HCL 5 MG/5ML PO SOLN: 5.0000 mg | Freq: Once | ORAL | Status: DC | PRN

## 2020-08-18 MED ORDER — MIDAZOLAM HCL 2 MG/2ML IJ SOLN
2.0000 mg | Freq: Once | INTRAMUSCULAR | Status: AC
  Administered 2020-08-18: 2 mg via INTRAVENOUS

## 2020-08-18 MED ORDER — ONDANSETRON HCL 4 MG/2ML IJ SOLN: 4.0000 mg | Freq: Once | INTRAMUSCULAR | Status: DC | PRN

## 2020-08-18 MED ORDER — OXYCODONE-ACETAMINOPHEN 5-325 MG PO TABS: 1.0000 | ORAL_TABLET | Freq: Four times a day (QID) | ORAL | 0 refills | Status: AC | PRN

## 2020-08-18 MED ORDER — MIDAZOLAM HCL 5 MG/5ML IJ SOLN
INTRAMUSCULAR | Status: DC | PRN
  Administered 2020-08-18 (×2): 1 mg via INTRAVENOUS

## 2020-08-18 MED ORDER — PROPOFOL 10 MG/ML IV BOLUS
INTRAVENOUS | Status: DC | PRN
  Administered 2020-08-18 (×2): 20 mg via INTRAVENOUS

## 2020-08-18 MED ORDER — ONDANSETRON HCL 4 MG/2ML IJ SOLN
INTRAMUSCULAR | Status: DC | PRN
  Administered 2020-08-18: 4 mg via INTRAVENOUS

## 2020-08-18 MED ORDER — MEPERIDINE HCL 25 MG/ML IJ SOLN: 6.2500 mg | INTRAMUSCULAR | Status: DC | PRN

## 2020-08-18 MED ORDER — ACETAMINOPHEN 160 MG/5ML PO SOLN
325.0000 mg | ORAL | Status: DC | PRN
Start: 2020-08-18 — End: 2020-08-18

## 2020-08-18 MED ORDER — ROPIVACAINE HCL 7.5 MG/ML IJ SOLN
INTRAMUSCULAR | Status: DC | PRN
  Administered 2020-08-18 (×4): 5 mL via PERINEURAL

## 2020-08-18 MED ORDER — MIDAZOLAM HCL 2 MG/2ML IJ SOLN
INTRAMUSCULAR | Status: AC
  Filled 2020-08-18: qty 2

## 2020-08-18 MED ORDER — CLONIDINE HCL (ANALGESIA) 100 MCG/ML EP SOLN
EPIDURAL | Status: DC | PRN
  Administered 2020-08-18: 100 ug

## 2020-08-18 MED ORDER — ACETAMINOPHEN 325 MG PO TABS: 325.0000 mg | ORAL_TABLET | ORAL | Status: DC | PRN

## 2020-08-18 MED ORDER — ONDANSETRON HCL 4 MG PO TABS: 4.0000 mg | ORAL_TABLET | Freq: Three times a day (TID) | ORAL | 0 refills | Status: DC | PRN

## 2020-08-18 MED ORDER — PROPOFOL 500 MG/50ML IV EMUL
INTRAVENOUS | Status: DC | PRN
  Administered 2020-08-18: 100 ug/kg/min via INTRAVENOUS

## 2020-08-18 MED ORDER — FENTANYL CITRATE (PF) 100 MCG/2ML IJ SOLN: 25.0000 ug | INTRAMUSCULAR | Status: DC | PRN

## 2020-08-18 MED ORDER — FENTANYL CITRATE (PF) 100 MCG/2ML IJ SOLN
INTRAMUSCULAR | Status: AC
  Filled 2020-08-18: qty 2

## 2020-08-18 MED ORDER — OXYCODONE HCL 5 MG PO TABS
5.0000 mg | ORAL_TABLET | Freq: Once | ORAL | Status: DC | PRN
Start: 2020-08-18 — End: 2020-08-18

## 2020-08-18 MED ORDER — CEFAZOLIN SODIUM-DEXTROSE 2-4 GM/100ML-% IV SOLN
INTRAVENOUS | Status: AC
  Filled 2020-08-18: qty 100

## 2020-08-18 SURGICAL SUPPLY — 37 items
APL PRP STRL LF DISP 70% ISPRP (MISCELLANEOUS) ×1
BLADE SURG 15 STRL LF DISP TIS (BLADE) ×1 IMPLANT
BLADE SURG 15 STRL SS (BLADE) ×2
BNDG CMPR 9X4 STRL LF SNTH (GAUZE/BANDAGES/DRESSINGS) ×1
BNDG ELASTIC 3X5.8 VLCR STR LF (GAUZE/BANDAGES/DRESSINGS) ×2 IMPLANT
BNDG ELASTIC 4X5.8 VLCR STR LF (GAUZE/BANDAGES/DRESSINGS) ×2 IMPLANT
BNDG ESMARK 4X9 LF (GAUZE/BANDAGES/DRESSINGS) ×2 IMPLANT
BNDG GAUZE ELAST 4 BULKY (GAUZE/BANDAGES/DRESSINGS) ×2 IMPLANT
CAP PIN ORTHO PINK (CAP) ×2 IMPLANT
CHLORAPREP W/TINT 26 (MISCELLANEOUS) ×2 IMPLANT
CORD BIPOLAR FORCEPS 12FT (ELECTRODE) ×2 IMPLANT
COVER BACK TABLE 60X90IN (DRAPES) ×2 IMPLANT
COVER MAYO STAND STRL (DRAPES) ×2 IMPLANT
CUFF TOURN SGL QUICK 18X4 (TOURNIQUET CUFF) ×2 IMPLANT
DRAPE EXTREMITY T 121X128X90 (DISPOSABLE) ×2 IMPLANT
DRAPE OEC MINIVIEW 54X84 (DRAPES) ×2 IMPLANT
DRAPE SURG 17X23 STRL (DRAPES) ×2 IMPLANT
GAUZE SPONGE 4X4 12PLY STRL (GAUZE/BANDAGES/DRESSINGS) ×2 IMPLANT
GAUZE XEROFORM 1X8 LF (GAUZE/BANDAGES/DRESSINGS) ×2 IMPLANT
GLOVE SRG 8 PF TXTR STRL LF DI (GLOVE) ×1 IMPLANT
GLOVE SURG ENC MOIS LTX SZ7.5 (GLOVE) ×2 IMPLANT
GLOVE SURG ENC TEXT LTX SZ7.5 (GLOVE) ×2 IMPLANT
GLOVE SURG UNDER POLY LF SZ8 (GLOVE) ×2
GOWN STRL REUS W/ TWL LRG LVL3 (GOWN DISPOSABLE) ×2 IMPLANT
GOWN STRL REUS W/TWL LRG LVL3 (GOWN DISPOSABLE) ×4
K-WIRE .045X4 (WIRE) ×4 IMPLANT
NS IRRIG 1000ML POUR BTL (IV SOLUTION) ×2 IMPLANT
PACK BASIN DAY SURGERY FS (CUSTOM PROCEDURE TRAY) ×2 IMPLANT
PAD CAST 4YDX4 CTTN HI CHSV (CAST SUPPLIES) ×1 IMPLANT
PADDING CAST COTTON 4X4 STRL (CAST SUPPLIES) ×2
SLEEVE SCD COMPRESS KNEE MED (MISCELLANEOUS) ×2 IMPLANT
SLING ARM FOAM STRAP LRG (SOFTGOODS) ×2 IMPLANT
SPLINT FIBERGLASS 4X30 (CAST SUPPLIES) ×2 IMPLANT
STOCKINETTE 4X48 STRL (DRAPES) ×2 IMPLANT
SYR BULB EAR ULCER 3OZ GRN STR (SYRINGE) ×2 IMPLANT
TOWEL GREEN STERILE FF (TOWEL DISPOSABLE) ×2 IMPLANT
UNDERPAD 30X36 HEAVY ABSORB (UNDERPADS AND DIAPERS) ×2 IMPLANT

## 2020-08-18 NOTE — Discharge Instructions (Addendum)
Activity As tolerated:  NO showers unless you can cover your splint with a waterproof device/bag and prevent it from getting wet. Do not shower for first 24 hours. No driving No heavy activities  Diet: Regular  Splint/Wound Care: Keep dressing clean & dry. Do not remove splint. Physical/Occupational therapy will create a new splint for you in 1 week at your appointment with them.  Do not change dressings  You will need an x-ray prior to your 2 week follow up with Dr. Claudia Desanctis and/or his team.  X-ray will be ordered for you and you can go to [facility] Entr[facility] and you will be directed to radiology.  You can get the x-ray the day of (prior to appointment or the day before your appointment with Dr. Len Childs)  Take Tylenol and/or ibuprofen as needed for mild/moderate pain Take the percocet (oxycodone-tylenol) as needed for severe pain. Avoid over 3000mg  of tylenol in 24 hours.  You had a nerve block, this will wear off in 24-48 hrs, so be aware that you may notice worse pain as this wears off. DO NOT take the percocet and the hydrodocone you were prescribed in the emergency room.    Call Doctor if any unusual problems occur such as pain, excessive Bleeding, unrelieved Nausea/vomiting, Fever &/or chills   Post Anesthesia Home Care Instructions  Activity: Get plenty of rest for the remainder of the day. A responsible individual must stay with you for 24 hours following the procedure.  For the next 24 hours, DO NOT: -Drive a car -Paediatric nurse -Drink alcoholic beverages -Take any medication unless instructed by your physician -Make any legal decisions or sign important papers.  Meals: Start with liquid foods such as gelatin or soup. Progress to regular foods as tolerated. Avoid greasy, spicy, heavy foods. If nausea and/or vomiting occur, drink only clear liquids until the nausea and/or vomiting subsides. Call your physician if vomiting continues.  Special  Instructions/Symptoms: Your throat may feel dry or sore from the anesthesia or the breathing tube placed in your throat during surgery. If this causes discomfort, gargle with warm salt water. The discomfort should disappear within 24 hours.  If you had a scopolamine patch placed behind your ear for the management of post- operative nausea and/or vomiting:  1. The medication in the patch is effective for 72 hours, after which it should be removed.  Wrap patch in a tissue and discard in the trash. Wash hands thoroughly with soap and water. 2. You may remove the patch earlier than 72 hours if you experience unpleasant side effects which may include dry mouth, dizziness or visual disturbances. 3. Avoid touching the patch. Wash your hands with soap and water after contact with the patch.    Regional Anesthesia Blocks  1. Numbness or the inability to move the "blocked" extremity may last from 3-48 hours after placement. The length of time depends on the medication injected and your individual response to the medication. If the numbness is not going away after 48 hours, call your surgeon.  2. The extremity that is blocked will need to be protected until the numbness is gone and the  Strength has returned. Because you cannot feel it, you will need to take extra care to avoid injury. Because it may be weak, you may have difficulty moving it or using it. You may not know what position it is in without looking at it while the block is in effect.  3. For blocks in the legs and  feet, returning to weight bearing and walking needs to be done carefully. You will need to wait until the numbness is entirely gone and the strength has returned. You should be able to move your leg and foot normally before you try and bear weight or walk. You will need someone to be with you when you first try to ensure you do not fall and possibly risk injury.  4. Bruising and tenderness at the needle site are common side effects and  will resolve in a few days.  5. Persistent numbness or new problems with movement should be communicated to the surgeon or the [facility] [phone] [place] [phone]).

## 2020-08-18 NOTE — Progress Notes (Signed)
AssistedDr. Hatchett with right, ultrasound guided, supraclavicular block. Side rails up, monitors on throughout procedure. See vital signs in flow sheet. Tolerated Procedure well.  

## 2020-08-18 NOTE — Addendum Note (Signed)
Addendum  created 08/18/20 1528 by Lyn Hollingshead, MD   Child order released for a procedure order, Clinical Note Signed, Intraprocedure Blocks edited, Intraprocedure Meds edited

## 2020-08-18 NOTE — Brief Op Note (Signed)
08/18/2020  2:16 PM  PATIENT:  Christine Nash  50 y.o. female  PRE-OPERATIVE DIAGNOSIS:  right ulnar fracture  POST-OPERATIVE DIAGNOSIS:  right ulnar fracture  PROCEDURE:  Procedure(s) with comments: Closed reduction and percutaneous pinning  of right ulnar fracture (Right) - 60 min  SURGEON:  Surgeon(s) and Role:    * Pace, Steffanie Dunn, MD - Primary  PHYSICIAN ASSISTANT: Software engineer, PA  ASSISTANTS: none   ANESTHESIA:   regional  EBL:  2   BLOOD ADMINISTERED:none  DRAINS: none   LOCAL MEDICATIONS USED:  NONE  SPECIMEN:  No Specimen  DISPOSITION OF SPECIMEN:  N/A  COUNTS:  YES  TOURNIQUET:  * Missing tourniquet times found for documented tourniquets in log: [id number] *  DICTATION: .Dragon Dictation  PLAN OF CARE: Discharge to home after PACU  PATIENT DISPOSITION:  PACU - hemodynamically stable.   Delay start of Pharmacological VTE agent (>24hrs) due to surgical blood loss or risk of bleeding: not applicable

## 2020-08-18 NOTE — Addendum Note (Signed)
Addendum  created 08/18/20 1506 by Maryella Shivers, CRNA   Charge Capture section accepted

## 2020-08-18 NOTE — Interval H&P Note (Signed)
History and Physical Interval Note:  08/18/2020 12:23 PM  Christine Nash  has presented today for surgery, with the diagnosis of right ulnar fracture.  The various methods of treatment have been discussed with the patient and family. After consideration of risks, benefits and other options for treatment, the patient has consented to  Procedure(s) with comments: Open reduction and internal fixation of right ulnar fracture (Right) - 60 min as a surgical intervention.  The patient's history has been reviewed, patient examined, no change in status, stable for surgery.  I have reviewed the patient's chart and labs.  Questions were answered to the patient's satisfaction.     Cindra Presume

## 2020-08-18 NOTE — Anesthesia Postprocedure Evaluation (Signed)
Anesthesia Post Note  Patient: Christine Nash  Procedure(s) Performed: Closed reduction and percutaneous pinning  of right ulnar fracture (Right Wrist)     Patient location during evaluation: Phase II Anesthesia Type: Regional Level of consciousness: awake Pain management: pain level controlled Vital Signs Assessment: post-procedure vital signs reviewed and stable Respiratory status: spontaneous breathing Cardiovascular status: stable Postop Assessment: no apparent nausea or vomiting Anesthetic complications: no   No complications documented.  Last Vitals:  Vitals:   08/18/20 1427 08/18/20 1439  BP: 99/61 (!) 92/57  Pulse: 65 66  Resp: 12 14  Temp:  36.5 C  SpO2: 95% 94%    Last Pain:  Vitals:   08/18/20 1439  TempSrc:   PainSc: 0-No pain                 Huston Foley

## 2020-08-18 NOTE — Op Note (Signed)
Operative Note   DATE OF OPERATION: 08/18/2020  SURGICAL DEPARTMENT: Plastic Surgery  PREOPERATIVE DIAGNOSES: Volar dislocation of the right distal radial ulnar joint with associated ulnar styloid fracture  POSTOPERATIVE DIAGNOSES:  same  PROCEDURE: 1.  Closed reduction of the volar dislocation of right distal radial ulnar joint 2.  Closed reduction percutaneous pinning of right ulnar styloid fracture  SURGEON: Talmadge Coventry, MD  ASSISTANT: Verdie Shire, PA The advanced practice practitioner (APP) assisted throughout the case.  The APP was essential in retraction and counter traction when needed to make the case progress smoothly.  This retraction and assistance made it possible to see the tissue plans for the procedure.  The assistance was needed for blood control, tissue re-approximation and assisted with closure of the incision site.  ANESTHESIA:  General.   COMPLICATIONS: None.   INDICATIONS FOR PROCEDURE:  The patient, Christine Nash is a 50 y.o. female born on [birth year], is here for treatment of volar dislocation of the right distal radial ulnar joint with associated ulnar styloid fracture. MRN: [id number]  CONSENT:  Informed consent was obtained directly from the patient. Risks, benefits and alternatives were fully discussed. Specific risks including but not limited to bleeding, infection, hematoma, seroma, scarring, pain, contracture, asymmetry, wound healing problems, and need for further surgery were all discussed. The patient did have an ample opportunity to have questions answered to satisfaction.   DESCRIPTION OF PROCEDURE:  The patient was taken to the operating room. SCDs were placed and antibiotics were given.  Regional anesthesia was administered.  The patient's operative site was prepped and draped in a sterile fashion. A time out was performed and all information was confirmed to be correct.  Started by palpating the patient's wrist was clearly able to  discern the ulnar head dislocated volar to the radius.  Dorsally directed pressure over the ulnar head I was able to get this reduced fairly readily.  C arm was then used to confirm appropriate reduction with congruent joint which was the case.  This did help to realign the ulnar styloid.  This point I placed 2 0.045 K wires across the ulnar styloid fracture to stabilize it.  The pins were then bent and clipped and pin caps were applied.  Xeroform was wrapped around the base and in a long-arm splint was applied.  Did confirm appropriate reduction of the fracture, appropriate location of the hardware, and appropriate alignment of the DRUJ with mini C arm at the end of the case.  The patient tolerated the procedure well.  There were no complications. The patient was allowed to wake from anesthesia, extubated and taken to the recovery room in satisfactory condition.

## 2020-08-18 NOTE — H&P (Signed)
Reason for Consult/CC: Dislocated right DRUJ  Christine Nash is an 50 y.o. female.  HPI: Patient presented to the emergency room on Monday with severe wrist pain.  She had a fall and then her husband fell on her.  X-rays showed a displaced ulnar styloid fracture and she was splinted in a short arm splint discharge.  Upon my review of the films later I noted the dislocated DRUJ and put her on the schedule for potential open reduction and fixation of the ulnar styloid piece.  She has had persistent pain and limitations in pronation supination.  She also reports difficulty with flexing and extending her small and ring finger.  She reports normal sensation in her hand.  Allergies:  Allergies  Allergen Reactions  . Meperidine Hcl Hives  . Prochlorperazine Edisylate     Other reaction(s): agitiation  . Propranolol     "brain fog" fatigue   . Trokendi [employer]] Other (See Comments)    "brain fog"   . Compazine [Prochlorperazine] Anxiety  . Phentermine Palpitations    Chest pain / palpitations     Medications:  Current Facility-Administered Medications:  .  ceFAZolin (ANCEF) IVPB 2g/100 mL premix, 2 g, Intravenous, On Call to OR, Cindra Presume, MD .  lactated ringers infusion, , Intravenous, Continuous, Merlinda Frederick, MD, Last Rate: 10 mL/hr at 08/18/20 1049, New Bag at 08/18/20 1049 .  midazolam (VERSED) injection 2 mg, 2 mg, Intravenous, Once, Lyn Hollingshead, MD  Past Medical History:  Diagnosis Date  . Anemia   . Anxiety   . Complication of anesthesia   . Depression   . Endometriosis   . Fracture of right ulnar styloid   . Hyperlipidemia   . PONV (postoperative nausea and vomiting)     Past Surgical History:  Procedure Laterality Date  . ABDOMINAL HYSTERECTOMY    . BUNIONECTOMY     2008  . CERVICAL SPINE SURGERY  2011,2012,2017   ACDF repair w/instrumentation  . CESAREAN SECTION  [id number]  . CHOLECYSTECTOMY N/A 01/17/2019   Procedure: LAPAROSCOPIC  CHOLECYSTECTOMY WITH INTRAOPERATIVE CHOLANGIOGRAM;  Surgeon: Alphonsa Overall, MD;  Location: [facility] ORS;  Service: General;  Laterality: N/A;  . KNEE SURGERY     left knee  . SHOULDER SURGERY     1996  . SUPRACERVICAL ABDOMINAL HYSTERECTOMY  2013  . TUBAL LIGATION  2011    Family History  Problem Relation Age of Onset  . Breast cancer Maternal Aunt   . Stroke Mother   . COPD Mother   . Bladder Cancer Father   . Melanoma Father   . Heart attack Father   . Diabetes Maternal Grandmother     Social History:  reports that she has never smoked. She has never used smokeless tobacco. She reports previous alcohol use. She reports that she does not use drugs.  Physical Exam Blood pressure (!) 142/79, pulse 64, temperature 98.6 F (37 C), temperature source Oral, resp. rate 16, height 5\' 4"  (1.626 m), weight 74.1 kg, SpO2 100 %. General: No acute distress.  Alert and oriented. Right upper extremity: Fingers are well-perfused with normal capillary refill and a palpable radial pulse.  Sensation is intact throughout.  She has a little bit of flexion extension of all fingers and her thumb.  She reports more difficulty flexing and extending the ulnar 2 digits.  She does have a deformity with a lack of the prominence of the ulnar head.  She reports severe limitations in pronation supination.  X-ray  shows volar dislocation of the DRUJ with displaced ulnar styloid fragment.  No results found for this or any previous visit (from the past 48 hour(s)).  No results found.  Assessment/Plan: Patient presents with dislocation of the DRUJ.  I have described the injury to the patient in detail.  Plan is to try closed reduction and if its not successful then open it and retrieve any interposed structures potentially extensor tendons.  We discussed the risks of the procedure that include bleeding, infection, damage to surrounding structures.  I explained I would pin the ulnar styloid piece for fixation.  I explained  she would need to be in I long-arm splint for at least a month.  I explained potential bony complications including nonunion, malunion and hardware complications.  I explained the potential for persistent instability and pain of the DRUJ.  She is fully understanding and wants to proceed.  Cindra Presume 08/18/2020, 12:19 PM

## 2020-08-18 NOTE — Transfer of Care (Signed)
Immediate Anesthesia Transfer of Care Note  Patient: Christine Nash  Procedure(s) Performed: Closed reduction and percutaneous pinning  of right ulnar fracture (Right Wrist)  Patient Location: PACU  Anesthesia Type:MAC combined with regional for post-op pain  Level of Consciousness: sedated  Airway & Oxygen Therapy: Patient Spontanous Breathing and Patient connected to face mask oxygen  Post-op Assessment: Report given to RN and Post -op Vital signs reviewed and stable  Post vital signs: Reviewed and stable  Last Vitals:  Vitals Value Taken Time  BP    Temp    Pulse    Resp    SpO2      Last Pain:  Vitals:   08/18/20 1045  TempSrc: Oral  PainSc: 6       Patients Stated Pain Goal: 3 (64/33/29 5188)  Complications: No complications documented.

## 2020-08-18 NOTE — Anesthesia Procedure Notes (Signed)
Anesthesia Regional Block: Supraclavicular block   Pre-Anesthetic Checklist: ,, timeout performed, Correct Patient, Correct Site, Correct Laterality, Correct Procedure, Correct Position, site marked, Risks and benefits discussed,  Surgical consent,  Pre-op evaluation,  At surgeon's request and post-op pain management  Laterality: Upper and Right  Prep: chloraprep       Needles:  Injection technique: Single-shot  Needle Type: Echogenic Stimulator Needle     Needle Length: 9cm  Needle Gauge: 20   Needle insertion depth: 1.5 cm   Additional Needles:   Procedures:,,,, ultrasound used (permanent image in chart),,,,  Narrative:  Start time: 08/18/2020 12:30 PM End time: 08/18/2020 12:40 PM Injection made incrementally with aspirations every 5 mL.  Performed by: Personally  Anesthesiologist: Lyn Hollingshead, MD

## 2020-08-18 NOTE — Anesthesia Preprocedure Evaluation (Signed)
Anesthesia Evaluation  Patient identified by MRN, date of birth, ID band Patient awake    Reviewed: Allergy & Precautions, NPO status , Patient's Chart, lab work & pertinent test results  History of Anesthesia Complications (+) PONV and history of anesthetic complications  Airway Mallampati: I       Dental no notable dental hx.    Pulmonary neg pulmonary ROS,    Pulmonary exam normal        Cardiovascular negative cardio ROS Normal cardiovascular exam     Neuro/Psych  Headaches, PSYCHIATRIC DISORDERS Anxiety Depression    GI/Hepatic negative GI ROS, Neg liver ROS,   Endo/Other  negative endocrine ROS  Renal/GU negative Renal ROS  negative genitourinary   Musculoskeletal   Abdominal Normal abdominal exam  (+)   Peds  Hematology   Anesthesia Other Findings   Reproductive/Obstetrics                             Anesthesia Physical Anesthesia Plan  ASA: II  Anesthesia Plan: MAC and Regional   Post-op Pain Management:    Induction:   PONV Risk Score and Plan: 3 and Ondansetron, Dexamethasone and Scopolamine patch - Pre-op  Airway Management Planned: Natural Airway and Simple Face Mask  Additional Equipment: None  Intra-op Plan:   Post-operative Plan:   Informed Consent: I have reviewed the patients History and Physical, chart, labs and discussed the procedure including the risks, benefits and alternatives for the proposed anesthesia with the patient or authorized representative who has indicated his/her understanding and acceptance.     Dental advisory given  Plan Discussed with: CRNA  Anesthesia Plan Comments:         Anesthesia Quick Evaluation

## 2020-08-19 ENCOUNTER — Encounter (HOSPITAL_BASED_OUTPATIENT_CLINIC_OR_DEPARTMENT_OTHER): Payer: Self-pay | Admitting: Plastic Surgery

## 2020-08-19 ENCOUNTER — Telehealth: Payer: Self-pay

## 2020-08-19 NOTE — Telephone Encounter (Signed)
Patient called to say that she had surgery with Dr. Claudia Desanctis yesterday and she is having issues with her pain level.  Patient said she can't make it a full 6 hours right now without taking more medication and she has been in severe pain for the last hour and a half.  Please call.  Patient's preferred pharmacy is [facility] on [address] in [place].

## 2020-08-22 NOTE — Telephone Encounter (Signed)
Called and spoke with the patient on (08/19/20) regarding her message below.  Patient stated that she have been taking the Percocet Q 4 hours, but it's not controlling her pain.  She stated that she took the Percocet last night (08/18/20) before bed @ (10:15am), the next morning @ (6:15am), and 2nd time between (8:30am -12:00-noon).  But it's not controlling the pain.    Informed the patient that I spoke with [facility] and he stated that she can alternate Ibuprofen and Tylenol between the Percocet.   He stated she should do the Percocet every 6 hours.  As the pain improves take Ibuprofen 600mg  every 8 hours, and the Tylenol 500mg  every 12 hours.  Informed the patient to may sure she does not take no more than 3000mg  of Tylenol in 24 hours.  The Percocet has Tylenol in it, and it should say how much on the bottle she can take.  Patient verbalized understanding and agreed.//AB/CMA

## 2020-08-23 DIAGNOSIS — S52601S Unspecified fracture of lower end of right ulna, sequela: Secondary | ICD-10-CM | POA: Diagnosis not present

## 2020-08-23 DIAGNOSIS — M25531 Pain in right wrist: Secondary | ICD-10-CM | POA: Diagnosis not present

## 2020-08-28 ENCOUNTER — Telehealth: Payer: Self-pay | Admitting: Plastic Surgery

## 2020-08-28 DIAGNOSIS — S62101D Fracture of unspecified carpal bone, right wrist, subsequent encounter for fracture with routine healing: Secondary | ICD-10-CM

## 2020-08-28 MED ORDER — OXYCODONE-ACETAMINOPHEN 7.5-325 MG PO TABS: 1.0000 | ORAL_TABLET | Freq: Four times a day (QID) | ORAL | 0 refills | Status: AC | PRN

## 2020-08-28 NOTE — Telephone Encounter (Signed)
Documented

## 2020-08-28 NOTE — Telephone Encounter (Signed)
Patient called with concerns of increased pain in her arm for the past day.  She finished her last percocet this morning.  Pain is 8/10 and was 3/10 prior.  She is concerned that the pain has increased and wants to be sure there is nothing wrong.  She has removed the splint which helps some.  There is some numbness and tingling in her fingers when the splint is on.  She has tried elevation on pillows.  Fingers are normal in color and all move well. I viewed the xrays and the readings.  ED was suggested for repeat Xray.  Patient declined due to co-pay.  I expressed concern.  Alternate plan is for Xray in the morning at [facility].  Continue elevation and motrin.  Percocet ordered.  Dr. Claudia Desanctis will likely want to see the patient in the morning.  Patient aware.  Call if any further change.

## 2020-08-29 ENCOUNTER — Ambulatory Visit (HOSPITAL_COMMUNITY)
Admission: RE | Admit: 2020-08-29 | Discharge: 2020-08-29 | Disposition: A | Discharge status: Home / Self Care | Payer: BC Managed Care – PPO | Source: Ambulatory Visit | Attending: Plastic Surgery | Admitting: Plastic Surgery

## 2020-08-29 ENCOUNTER — Other Ambulatory Visit: Payer: Self-pay

## 2020-08-29 ENCOUNTER — Telehealth: Payer: Self-pay

## 2020-08-29 DIAGNOSIS — M7989 Other specified soft tissue disorders: Secondary | ICD-10-CM | POA: Diagnosis not present

## 2020-08-29 DIAGNOSIS — S62101D Fracture of unspecified carpal bone, right wrist, subsequent encounter for fracture with routine healing: Secondary | ICD-10-CM | POA: Insufficient documentation

## 2020-08-29 NOTE — Telephone Encounter (Signed)
Patient called to say that she had surgery with Dr. Claudia Desanctis on 08/18/20.  Patient said that she was experiencing increased pain over the weekend and spoke with Dr. Marla Roe, who was the doctor on-call.  Patient said that Dr. Marla Roe told her to get an x-ray and to hold off on eating in case she needs to go into surgery.  Patient wanted to let us know that she just had an x-ray.  Please let her know if she can eat or if she needs to wait.

## 2020-08-29 NOTE — Telephone Encounter (Signed)
Called and spoke with the patient and informed her that I spoke with Dr. Marla Roe and she stated that yes she can eat.    Also informed the patient that the x-ray results are in and I will let Dr. Claudia Desanctis know, and call her back to let her know what the next step is.  Patient asked if Dr. Marla Roe could look at the x-ray and let her know what it showed.    I informed the patient that Dr. Marla Roe looked at the x-ray,and she said that Dr. Claudia Desanctis will need to read the x-ray and speak with the patient.  Patient verbalized understanding and agreed.//AB/CMA

## 2020-08-29 NOTE — Telephone Encounter (Signed)
Called the patient back and informed her that Dr. Claudia Desanctis reviewed the x-ray and it's normal.  He stated for her to keep her appointment with him on Thursday of this week.  Patient verbalized understanding and agreed.//AB/CMA

## 2020-09-01 ENCOUNTER — Ambulatory Visit (INDEPENDENT_AMBULATORY_CARE_PROVIDER_SITE_OTHER): Payer: BC Managed Care – PPO | Admitting: Plastic Surgery

## 2020-09-01 ENCOUNTER — Encounter: Payer: Self-pay | Admitting: Plastic Surgery

## 2020-09-01 ENCOUNTER — Other Ambulatory Visit: Payer: Self-pay

## 2020-09-01 VITALS — BP 105/71 | HR 75

## 2020-09-01 DIAGNOSIS — S62101D Fracture of unspecified carpal bone, right wrist, subsequent encounter for fracture with routine healing: Secondary | ICD-10-CM

## 2020-09-01 NOTE — Progress Notes (Signed)
Patient is here about 2 weeks postop from closed reduction of the DRUJ dislocation and percutaneous fixation of the ulnar styloid fracture.  She did have a concern for something wrong over the weekend and x-ray was performed which showed good alignment of the DRUJ and maintained positioning of the pins.  She feels like since then her pain and concern has dissipated but she still feels quite a bit of soreness in the wrist.  On exam she has good range of motion of her fingers and normal sensation in her fingers.  Anatomically the wrist looks to be in appropriate position with no external deformities.  The pins are in place with no signs of pin site infection.  We will plan to have her continue to wear her sugar tong splint all the time.  I encouraged her to take her fingers through range of motion to keep them from getting stiff.  We will see her again in 3 weeks with another x-ray and I will likely be able to pull the pins at that time.  All her questions were answered.

## 2020-09-02 ENCOUNTER — Telehealth: Payer: Self-pay | Admitting: Internal Medicine

## 2020-09-02 ENCOUNTER — Encounter: Payer: Self-pay | Admitting: Internal Medicine

## 2020-09-02 ENCOUNTER — Ambulatory Visit (INDEPENDENT_AMBULATORY_CARE_PROVIDER_SITE_OTHER): Payer: BC Managed Care – PPO | Admitting: Internal Medicine

## 2020-09-02 VITALS — BP 108/68 | HR 76 | Ht 64.0 in | Wt 163.0 lb

## 2020-09-02 DIAGNOSIS — E7800 Pure hypercholesterolemia, unspecified: Secondary | ICD-10-CM | POA: Diagnosis not present

## 2020-09-02 MED ORDER — ROSUVASTATIN CALCIUM 20 MG PO TABS: 20.0000 mg | ORAL_TABLET | Freq: Every day | ORAL | 3 refills | Status: DC

## 2020-09-02 NOTE — Telephone Encounter (Signed)
Left message for patient regarding the  10/12/20 10:00 am calcium scoring appointment arrival 9:45 am [address], [address] for check in.  Will mail information to patient and requested she call with questions or concerns.

## 2020-09-02 NOTE — Patient Instructions (Addendum)
Medication Instructions:  START rosuvastatin (Crestor) 20 mg daily  *If you need a refill on your cardiac medications before your next appointment, please call your pharmacy*   Lab Work: Please return for FASTING labs in 3 months prior to appointment (Lipid)  Our in office lab hours are Monday-Friday 8:00-4:00, closed for lunch 12:45-1:45 pm.  No appointment needed.  Testing: CT coronary calcium score. This test is done at [address]. This is $99 out of pocket. --we will call in April to get this scheduled.   Coronary CalciumScan A coronary calcium scan is an imaging test used to look for deposits of calcium and other fatty materials (plaques) in the inner lining of the blood vessels of the heart (coronary arteries). These deposits of calcium and plaques can partly clog and narrow the coronary arteries without producing any symptoms or warning signs. This puts a person at risk for a heart attack. This test can detect these deposits before symptoms develop. Tell a health care provider about:  Any allergies you have.  All medicines you are taking, including vitamins, herbs, eye drops, creams, and over-the-counter medicines.  Any problems you or family members have had with anesthetic medicines.  Any blood disorders you have.  Any surgeries you have had.  Any medical conditions you have.  Whether you are pregnant or may be pregnant. What are the risks? Generally, this is a safe procedure. However, problems may occur, including:  Harm to a pregnant woman and her unborn baby. This test involves the use of radiation. Radiation exposure can be dangerous to a pregnant woman and her unborn baby. If you are pregnant, you generally should not have this procedure done.  Slight increase in the risk of cancer. This is because of the radiation involved in the test. What happens before the procedure? No preparation is needed for this procedure. What happens during the  procedure?  You will undress and remove any jewelry around your neck or chest.  You will put on a hospital gown.  Sticky electrodes will be placed on your chest. The electrodes will be connected to an electrocardiogram (ECG) machine to record a tracing of the electrical activity of your heart.  A CT scanner will take pictures of your heart. During this time, you will be asked to lie still and hold your breath for 2-3 seconds while a picture of your heart is being taken. The procedure may vary among health care providers and hospitals. What happens after the procedure?  You can get dressed.  You can return to your normal activities.  It is up to you to get the results of your test. Ask your health care provider, or the department that is doing the test, when your results will be ready. Summary  A coronary calcium scan is an imaging test used to look for deposits of calcium and other fatty materials (plaques) in the inner lining of the blood vessels of the heart (coronary arteries).  Generally, this is a safe procedure. Tell your health care provider if you are pregnant or may be pregnant.  No preparation is needed for this procedure.  A CT scanner will take pictures of your heart.  You can return to your normal activities after the scan is done. This information is not intended to replace advice given to you by your health care provider. Make sure you discuss any questions you have with your health care provider. Document Released: 12/22/2007 Document Revised: 05/14/2016 Document Reviewed: 05/14/2016 Elsevier  Interactive Patient Education  2017 [place]: At [facility], you and your health needs are our priority.  As part of our continuing mission to provide you with exceptional heart care, we have created designated Provider Care Teams.  These Care Teams include your primary Cardiologist (physician) and Advanced Practice Providers (APPs -  Physician Assistants and  Nurse Practitioners) who all work together to provide you with the care you need, when you need it.  We recommend signing up for the patient portal called "MyChart".  Sign up information is provided on this After Visit Summary.  MyChart is used to connect with patients for Virtual Visits (Telemedicine).  Patients are able to view lab/test results, encounter notes, upcoming appointments, etc.  Non-urgent messages can be sent to your provider as well.   To learn more about what you can do with MyChart, go to [email or web address].    Your next appointment:   3 month(s)  The format for your next appointment:   In Person  Provider:   K. Mali Hilty, MD

## 2020-09-02 NOTE — Progress Notes (Signed)
LIPID CLINIC CONSULT NOTE  Chief Complaint:  Manage dyslipidemia  Primary Care Physician: Orma Flaming, MD  Primary Cardiologist:  No primary care provider on file.  HPI:  Christine Nash is a 50 y.o. female who is being seen today for the evaluation of dyslipidemia at the request of Orma Flaming, MD. This is a pleasant 49 year old female who unfortunately recently had to have surgery to have a right ulnar fracture and is currently in a cast and sling.  She was referred by Dr. Eliberto Ivory for evaluation of dyslipidemia.  Wonderfully, she is actually lost 30 to 40 pounds recently with significant dietary changes and activity prior to this recent fracture.  Despite the weight loss, however her cholesterol did not significantly improve in fact it actually has increased compared to where it was about 2 years ago.  Total cholesterol now 281, triglycerides 58, HDL 70 and LDL 199.  She does not report a strong family history of heart disease.  I wonder if her HDL of 70 may be cardioprotective for her.  Overall she reports eating a very healthy sounding diet.  She is cut out a lot of sources of saturated fats.  PMHx:  Past Medical History:  Diagnosis Date  . Anemia   . Anxiety   . Complication of anesthesia   . Depression   . Endometriosis   . Fracture of right ulnar styloid   . Hyperlipidemia   . PONV (postoperative nausea and vomiting)     Past Surgical History:  Procedure Laterality Date  . ABDOMINAL HYSTERECTOMY    . BUNIONECTOMY     2008  . CERVICAL SPINE SURGERY  2011,2012,2017   ACDF repair w/instrumentation  . CESAREAN SECTION  [id number]  . CHOLECYSTECTOMY N/A 01/17/2019   Procedure: LAPAROSCOPIC CHOLECYSTECTOMY WITH INTRAOPERATIVE CHOLANGIOGRAM;  Surgeon: Alphonsa Overall, MD;  Location: [facility];  Service: General;  Laterality: N/A;  . KNEE SURGERY     left knee  . PERCUTANEOUS PINNING Right 08/18/2020   Procedure: Closed reduction and percutaneous pinning  of right ulnar  fracture;  Surgeon: Cindra Presume, MD;  Location: [place];  Service: Plastics;  Laterality: Right;  60 min  . SHOULDER SURGERY     1996  . SUPRACERVICAL ABDOMINAL HYSTERECTOMY  2013  . TUBAL LIGATION  2011    FAMHx:  Family History  Problem Relation Age of Onset  . Breast cancer Maternal Aunt   . Stroke Mother   . COPD Mother   . Bladder Cancer Father   . Melanoma Father   . Heart attack Father   . Diabetes Maternal Grandmother     SOCHx:   reports that she has never smoked. She has never used smokeless tobacco. She reports previous alcohol use. She reports that she does not use drugs.  ALLERGIES:  Allergies  Allergen Reactions  . Meperidine Hcl Hives  . Prochlorperazine Edisylate     Other reaction(s): agitiation  . Propranolol     "brain fog" fatigue   . Trokendi [employer]] Other (See Comments)    "brain fog"   . Phentermine Palpitations    Chest pain / palpitations     ROS: Pertinent items noted in HPI and remainder of comprehensive ROS otherwise negative.  HOME MEDS: Current Outpatient Medications on File Prior to Visit  Medication Sig Dispense Refill  . B Complex-Biotin-FA (B-COMPLEX PO) Take 1 capsule by mouth daily.    . baclofen (LIORESAL) 20 MG tablet Take 1 tablet (20 mg  total) by mouth at bedtime. 90 tablet 1  . cholecalciferol (VITAMIN D) 25 MCG (1000 UT) tablet Take 2,000 Units by mouth daily.    Marland Kitchen oxyCODONE-acetaminophen (PERCOCET) 7.5-325 MG tablet      No current facility-administered medications on file prior to visit.    LABS/IMAGING: No results found for this or any previous visit (from the past 48 hour(s)). No results found.  LIPID PANEL:    Component Value Date/Time   CHOL 281 (H) 08/10/2020 1016   TRIG 58.0 08/10/2020 1016   HDL 70.00 08/10/2020 1016   CHOLHDL 4 08/10/2020 1016   VLDL 11.6 08/10/2020 1016   LDLCALC 199 (H) 08/10/2020 1016    WEIGHTS: Wt Readings from Last 3 Encounters:  09/02/20 163  lb (73.9 kg)  08/18/20 163 lb 5.8 oz (74.1 kg)  08/15/20 159 lb (72.1 kg)    VITALS: BP 108/68   Pulse 76   Ht 5\' 4"  (1.626 m)   Wt 163 lb (73.9 kg)   SpO2 98%   BMI 27.98 kg/m   EXAM: General appearance: alert and no distress Neck: no carotid bruit, no JVD and thyroid not enlarged, symmetric, no tenderness/mass/nodules Lungs: clear to auscultation bilaterally Heart: regular rate and rhythm, S1, S2 normal, no murmur, click, rub or gallop Abdomen: soft, non-tender; bowel sounds normal; no masses,  no organomegaly Extremities: Right forearm is casted in a sling Pulses: 2+ and symmetric Skin: Skin color, texture, turgor normal. No rashes or lesions Neurologic: Grossly normal Psych: Pleasant  EKG: Deferred  ASSESSMENT: 1. Mixed dyslipidemia with LDL greater than 190 2. Recent significant intentional weight loss  PLAN: 1.   Ms. Fullman has a mixed dyslipidemia that is a possible genetic dyslipidemia although there is no clear family history of early onset heart disease.  Of heart disease in her father but that occurred in his 43s.  It sounds like his lifestyle choices were not quite as healthy.  I think she is a good candidate for pharmacotherapy at this point because she was not able to affect her numbers with diet alone.  I would recommend starting rosuvastatin 20 mg daily.  This should give her the 50% recommended LDL reduction based on current guidelines for LDL greater than 190.  I would also recommend a calcium score.  She will not likely get out of her cast until the end of March.  We will try to get this scheduled in April for easier convenience and positioning of her arm.  I would like them to follow-up with her about 3 months from now or in May with repeat lipids.  If her calcium score is significantly elevated showing any early onset heart disease, then we may need to further lower LDL to target less than 70.  She may also be a candidate for genetic testing which we will  discuss further at that time.  Thanks again for the kind referral.  Pixie Casino, MD, FACC, [place] Director of the [facility] Lipid [facility]  Card[facility] Diplomate of the American Board of Clinical Lipidology Attending Cardiologist  Direct Dial: [phone]  Fax: [phone]  Website:  [email or web address]  Nadean Corwin Hilty 09/02/2020, 2:13 PM

## 2020-09-20 ENCOUNTER — Other Ambulatory Visit: Payer: Self-pay

## 2020-09-20 ENCOUNTER — Ambulatory Visit (HOSPITAL_COMMUNITY)
Admission: RE | Admit: 2020-09-20 | Discharge: 2020-09-20 | Disposition: A | Discharge status: Home / Self Care | Payer: BC Managed Care – PPO | Source: Ambulatory Visit | Attending: Plastic Surgery | Admitting: Plastic Surgery

## 2020-09-20 DIAGNOSIS — S62101D Fracture of unspecified carpal bone, right wrist, subsequent encounter for fracture with routine healing: Secondary | ICD-10-CM | POA: Insufficient documentation

## 2020-09-20 DIAGNOSIS — S52611A Displaced fracture of right ulna styloid process, initial encounter for closed fracture: Secondary | ICD-10-CM | POA: Diagnosis not present

## 2020-09-22 ENCOUNTER — Encounter: Payer: Self-pay | Admitting: Plastic Surgery

## 2020-09-22 ENCOUNTER — Other Ambulatory Visit: Payer: Self-pay | Admitting: Family Medicine

## 2020-09-22 ENCOUNTER — Ambulatory Visit (INDEPENDENT_AMBULATORY_CARE_PROVIDER_SITE_OTHER): Payer: BC Managed Care – PPO | Admitting: Plastic Surgery

## 2020-09-22 ENCOUNTER — Other Ambulatory Visit: Payer: Self-pay

## 2020-09-22 VITALS — BP 124/86 | HR 90

## 2020-09-22 DIAGNOSIS — S62101D Fracture of unspecified carpal bone, right wrist, subsequent encounter for fracture with routine healing: Secondary | ICD-10-CM

## 2020-09-22 MED ORDER — CELECOXIB 200 MG PO CAPS: 200.0000 mg | ORAL_CAPSULE | Freq: Two times a day (BID) | ORAL | 1 refills | Status: DC

## 2020-09-22 NOTE — Progress Notes (Signed)
Patient presents 5 weeks postop from closed reduction of the DRUJ dislocation with pinning of an ulnar styloid fracture.  All of her x-rays have showed good alignment of the DRUJ with good alignment of the ulnar styloid as well.  I decided to pull her pins today.  On exam she is a bit stiff as expected with limited flexion extension of all fingers.  Her wrist is quite stiff as well.  We will get her into therapy to start moving through that.  We will have her gradually wean herself out of the splint over the next couple weeks.  I will plan to see her again in 6 weeks from now.  I will prescribe her Celebrex to help with pain during the therapy process.

## 2020-09-27 DIAGNOSIS — M25521 Pain in right elbow: Secondary | ICD-10-CM | POA: Diagnosis not present

## 2020-09-27 DIAGNOSIS — M25541 Pain in joints of right hand: Secondary | ICD-10-CM | POA: Diagnosis not present

## 2020-09-27 DIAGNOSIS — M25531 Pain in right wrist: Secondary | ICD-10-CM | POA: Diagnosis not present

## 2020-09-27 DIAGNOSIS — M25621 Stiffness of right elbow, not elsewhere classified: Secondary | ICD-10-CM | POA: Diagnosis not present

## 2020-09-29 DIAGNOSIS — M25541 Pain in joints of right hand: Secondary | ICD-10-CM | POA: Diagnosis not present

## 2020-09-29 DIAGNOSIS — M25621 Stiffness of right elbow, not elsewhere classified: Secondary | ICD-10-CM | POA: Diagnosis not present

## 2020-09-29 DIAGNOSIS — M25531 Pain in right wrist: Secondary | ICD-10-CM | POA: Diagnosis not present

## 2020-09-29 DIAGNOSIS — M25521 Pain in right elbow: Secondary | ICD-10-CM | POA: Diagnosis not present

## 2020-09-29 MED ORDER — BACLOFEN 20 MG PO TABS: 20.0000 mg | ORAL_TABLET | Freq: Every day | ORAL | 1 refills | Status: DC

## 2020-10-03 DIAGNOSIS — M25521 Pain in right elbow: Secondary | ICD-10-CM | POA: Diagnosis not present

## 2020-10-03 DIAGNOSIS — M25531 Pain in right wrist: Secondary | ICD-10-CM | POA: Diagnosis not present

## 2020-10-03 DIAGNOSIS — M25621 Stiffness of right elbow, not elsewhere classified: Secondary | ICD-10-CM | POA: Diagnosis not present

## 2020-10-03 DIAGNOSIS — M25541 Pain in joints of right hand: Secondary | ICD-10-CM | POA: Diagnosis not present

## 2020-10-05 DIAGNOSIS — M25541 Pain in joints of right hand: Secondary | ICD-10-CM | POA: Diagnosis not present

## 2020-10-05 DIAGNOSIS — M25531 Pain in right wrist: Secondary | ICD-10-CM | POA: Diagnosis not present

## 2020-10-05 DIAGNOSIS — M25621 Stiffness of right elbow, not elsewhere classified: Secondary | ICD-10-CM | POA: Diagnosis not present

## 2020-10-05 DIAGNOSIS — M25521 Pain in right elbow: Secondary | ICD-10-CM | POA: Diagnosis not present

## 2020-10-06 DIAGNOSIS — M25621 Stiffness of right elbow, not elsewhere classified: Secondary | ICD-10-CM | POA: Diagnosis not present

## 2020-10-06 DIAGNOSIS — M25531 Pain in right wrist: Secondary | ICD-10-CM | POA: Diagnosis not present

## 2020-10-06 DIAGNOSIS — M25521 Pain in right elbow: Secondary | ICD-10-CM | POA: Diagnosis not present

## 2020-10-06 DIAGNOSIS — M25541 Pain in joints of right hand: Secondary | ICD-10-CM | POA: Diagnosis not present

## 2020-10-10 DIAGNOSIS — M25621 Stiffness of right elbow, not elsewhere classified: Secondary | ICD-10-CM | POA: Diagnosis not present

## 2020-10-10 DIAGNOSIS — M25521 Pain in right elbow: Secondary | ICD-10-CM | POA: Diagnosis not present

## 2020-10-10 DIAGNOSIS — M25531 Pain in right wrist: Secondary | ICD-10-CM | POA: Diagnosis not present

## 2020-10-10 DIAGNOSIS — M25541 Pain in joints of right hand: Secondary | ICD-10-CM | POA: Diagnosis not present

## 2020-10-11 DIAGNOSIS — M25531 Pain in right wrist: Secondary | ICD-10-CM | POA: Diagnosis not present

## 2020-10-11 DIAGNOSIS — M25621 Stiffness of right elbow, not elsewhere classified: Secondary | ICD-10-CM | POA: Diagnosis not present

## 2020-10-11 DIAGNOSIS — M25541 Pain in joints of right hand: Secondary | ICD-10-CM | POA: Diagnosis not present

## 2020-10-11 DIAGNOSIS — M25521 Pain in right elbow: Secondary | ICD-10-CM | POA: Diagnosis not present

## 2020-10-12 ENCOUNTER — Ambulatory Visit (INDEPENDENT_AMBULATORY_CARE_PROVIDER_SITE_OTHER)
Admission: RE | Admit: 2020-10-12 | Discharge: 2020-10-12 | Disposition: A | Discharge status: Home / Self Care | Payer: Self-pay | Source: Ambulatory Visit | Attending: Internal Medicine | Admitting: Internal Medicine

## 2020-10-12 ENCOUNTER — Other Ambulatory Visit: Payer: Self-pay

## 2020-10-12 DIAGNOSIS — E7800 Pure hypercholesterolemia, unspecified: Secondary | ICD-10-CM

## 2020-10-14 DIAGNOSIS — M25531 Pain in right wrist: Secondary | ICD-10-CM | POA: Diagnosis not present

## 2020-10-14 DIAGNOSIS — M25521 Pain in right elbow: Secondary | ICD-10-CM | POA: Diagnosis not present

## 2020-10-14 DIAGNOSIS — M25541 Pain in joints of right hand: Secondary | ICD-10-CM | POA: Diagnosis not present

## 2020-10-14 DIAGNOSIS — M25621 Stiffness of right elbow, not elsewhere classified: Secondary | ICD-10-CM | POA: Diagnosis not present

## 2020-10-17 ENCOUNTER — Other Ambulatory Visit: Payer: Self-pay | Admitting: *Deleted

## 2020-10-17 DIAGNOSIS — M25521 Pain in right elbow: Secondary | ICD-10-CM | POA: Diagnosis not present

## 2020-10-17 DIAGNOSIS — M25541 Pain in joints of right hand: Secondary | ICD-10-CM | POA: Diagnosis not present

## 2020-10-17 DIAGNOSIS — M25531 Pain in right wrist: Secondary | ICD-10-CM | POA: Diagnosis not present

## 2020-10-17 DIAGNOSIS — M25621 Stiffness of right elbow, not elsewhere classified: Secondary | ICD-10-CM | POA: Diagnosis not present

## 2020-10-17 DIAGNOSIS — R911 Solitary pulmonary nodule: Secondary | ICD-10-CM

## 2020-10-20 DIAGNOSIS — M7581 Other shoulder lesions, right shoulder: Secondary | ICD-10-CM | POA: Diagnosis not present

## 2020-10-20 DIAGNOSIS — S42145A Nondisplaced fracture of glenoid cavity of scapula, left shoulder, initial encounter for closed fracture: Secondary | ICD-10-CM | POA: Diagnosis not present

## 2020-10-20 DIAGNOSIS — M778 Other enthesopathies, not elsewhere classified: Secondary | ICD-10-CM | POA: Diagnosis not present

## 2020-10-20 DIAGNOSIS — M7541 Impingement syndrome of right shoulder: Secondary | ICD-10-CM | POA: Diagnosis not present

## 2020-10-21 DIAGNOSIS — M25531 Pain in right wrist: Secondary | ICD-10-CM | POA: Diagnosis not present

## 2020-10-21 DIAGNOSIS — M25621 Stiffness of right elbow, not elsewhere classified: Secondary | ICD-10-CM | POA: Diagnosis not present

## 2020-10-21 DIAGNOSIS — M25521 Pain in right elbow: Secondary | ICD-10-CM | POA: Diagnosis not present

## 2020-10-21 DIAGNOSIS — M25541 Pain in joints of right hand: Secondary | ICD-10-CM | POA: Diagnosis not present

## 2020-10-24 DIAGNOSIS — M25541 Pain in joints of right hand: Secondary | ICD-10-CM | POA: Diagnosis not present

## 2020-10-24 DIAGNOSIS — M25521 Pain in right elbow: Secondary | ICD-10-CM | POA: Diagnosis not present

## 2020-10-24 DIAGNOSIS — M25621 Stiffness of right elbow, not elsewhere classified: Secondary | ICD-10-CM | POA: Diagnosis not present

## 2020-10-24 DIAGNOSIS — M25531 Pain in right wrist: Secondary | ICD-10-CM | POA: Diagnosis not present

## 2020-10-28 DIAGNOSIS — M25541 Pain in joints of right hand: Secondary | ICD-10-CM | POA: Diagnosis not present

## 2020-10-28 DIAGNOSIS — M25621 Stiffness of right elbow, not elsewhere classified: Secondary | ICD-10-CM | POA: Diagnosis not present

## 2020-10-28 DIAGNOSIS — M25521 Pain in right elbow: Secondary | ICD-10-CM | POA: Diagnosis not present

## 2020-10-28 DIAGNOSIS — M25531 Pain in right wrist: Secondary | ICD-10-CM | POA: Diagnosis not present

## 2020-11-01 DIAGNOSIS — M25531 Pain in right wrist: Secondary | ICD-10-CM | POA: Diagnosis not present

## 2020-11-01 DIAGNOSIS — M25621 Stiffness of right elbow, not elsewhere classified: Secondary | ICD-10-CM | POA: Diagnosis not present

## 2020-11-01 DIAGNOSIS — M25541 Pain in joints of right hand: Secondary | ICD-10-CM | POA: Diagnosis not present

## 2020-11-01 DIAGNOSIS — M25521 Pain in right elbow: Secondary | ICD-10-CM | POA: Diagnosis not present

## 2020-11-02 ENCOUNTER — Other Ambulatory Visit: Payer: Self-pay

## 2020-11-02 ENCOUNTER — Ambulatory Visit (INDEPENDENT_AMBULATORY_CARE_PROVIDER_SITE_OTHER): Payer: BC Managed Care – PPO | Admitting: Plastic Surgery

## 2020-11-02 ENCOUNTER — Encounter: Payer: Self-pay | Admitting: Plastic Surgery

## 2020-11-02 VITALS — BP 104/69 | HR 84

## 2020-11-02 DIAGNOSIS — S63014A Dislocation of distal radioulnar joint of right wrist, initial encounter: Secondary | ICD-10-CM

## 2020-11-02 MED ORDER — CELECOXIB 200 MG PO CAPS: 200.0000 mg | ORAL_CAPSULE | Freq: Two times a day (BID) | ORAL | 1 refills | Status: DC

## 2020-11-02 NOTE — Progress Notes (Signed)
Patient presents about 10 weeks postop from closed reduction of the DRUJ dislocation and pinning of an ulnar styloid fracture for her right wrist.  She feels like things are going much better at this point.  She is been working with therapy and feels like her range of motion has improved quite a bit as has history as has her strength and function.  On exam she demonstrates about 20 to 30 degrees of wrist flexion with maybe 10 degrees of wrist extension.  She has near full pronation but supination is limited quite a bit at this point.  She has full range of motion of her fingers and can make a full complete fist with normal sensation in her fingers as well.  Anatomically the DRUJ looks to be in appropriate position upon visual inspection.  Overall I think her progress is good and she wants to continue with therapy for a while which I think is totally reasonable.  I will plan to see her again in 6 to 8 weeks to reevaluate.  I have renewed her prescription for Celebrex as she says it helps quite a bit around time for therapy and decreasing her pain.  All her questions were answered.

## 2020-11-07 DIAGNOSIS — M25621 Stiffness of right elbow, not elsewhere classified: Secondary | ICD-10-CM | POA: Diagnosis not present

## 2020-11-07 DIAGNOSIS — M25531 Pain in right wrist: Secondary | ICD-10-CM | POA: Diagnosis not present

## 2020-11-07 DIAGNOSIS — M25521 Pain in right elbow: Secondary | ICD-10-CM | POA: Diagnosis not present

## 2020-11-07 DIAGNOSIS — M25541 Pain in joints of right hand: Secondary | ICD-10-CM | POA: Diagnosis not present

## 2020-11-09 ENCOUNTER — Other Ambulatory Visit: Payer: Self-pay

## 2020-11-09 ENCOUNTER — Encounter: Payer: Self-pay | Admitting: Obstetrics and Gynecology

## 2020-11-09 ENCOUNTER — Ambulatory Visit (INDEPENDENT_AMBULATORY_CARE_PROVIDER_SITE_OTHER): Payer: BC Managed Care – PPO | Admitting: Obstetrics and Gynecology

## 2020-11-09 VITALS — BP 122/78

## 2020-11-09 DIAGNOSIS — N952 Postmenopausal atrophic vaginitis: Secondary | ICD-10-CM

## 2020-11-09 DIAGNOSIS — N941 Unspecified dyspareunia: Secondary | ICD-10-CM

## 2020-11-09 MED ORDER — ESTRADIOL 2 MG VA RING: 2.0000 mg | VAGINAL_RING | VAGINAL | 0 refills | Status: DC

## 2020-11-09 NOTE — Progress Notes (Signed)
GYNECOLOGY  VISIT   HPI: 50 y.o.   Married White or Caucasian Not Hispanic or Latino  female   [phone] with No LMP recorded. Patient has had a hysterectomy.  H/O supracervical hysterectomy.  here for pain with intercourse. Pain with entry on intercourse. She stopped the vaginal estrogen over a year ago. She has been using lubrication, her vagina is too tight and uncomfortable for him to penetrate.  They have tried several lubricants. Husband had a reaction to astroglide, KY jelly didn't work.     She had surgery for a fractured wrist in early February. Slow healing process.   GYNECOLOGIC HISTORY: No LMP recorded. Patient has had a hysterectomy. Contraception:n/a Menopausal hormone therapy: n/a        OB History    Gravida  3   Para  3   Term  2   Preterm  1   AB      Living  3     SAB      IAB      Ectopic      Multiple      Live Births  3              Patient Active Problem List   Diagnosis Date Noted  . Cervicogenic headache 10/18/2019  . Generalized anxiety disorder 02/11/2018  . Hormone replacement therapy (HRT) 02/11/2018  . Spinal stenosis of cervical region 02/11/2018  . Hyperlipidemia 02/11/2018  . Vitamin D deficiency 02/11/2018  . Attention deficit hyperactivity disorder, predominantly inattentive type 06/27/2011    Past Medical History:  Diagnosis Date  . Anemia   . Anxiety   . Complication of anesthesia   . Depression   . Endometriosis   . Fracture of right ulnar styloid   . Hyperlipidemia   . PONV (postoperative nausea and vomiting)     Past Surgical History:  Procedure Laterality Date  . ABDOMINAL HYSTERECTOMY    . BUNIONECTOMY     2008  . CERVICAL SPINE SURGERY  2011,2012,2017   ACDF repair w/instrumentation  . CESAREAN SECTION  [id number]  . CHOLECYSTECTOMY N/A 01/17/2019   Procedure: LAPAROSCOPIC CHOLECYSTECTOMY WITH INTRAOPERATIVE CHOLANGIOGRAM;  Surgeon: Alphonsa Overall, MD;  Location: [facility];  Service: General;  Laterality:  N/A;  . KNEE SURGERY     left knee  . PERCUTANEOUS PINNING Right 08/18/2020   Procedure: Closed reduction and percutaneous pinning  of right ulnar fracture;  Surgeon: Cindra Presume, MD;  Location: [place];  Service: Plastics;  Laterality: Right;  60 min  . SHOULDER SURGERY     1996  . SUPRACERVICAL ABDOMINAL HYSTERECTOMY  2013  . TUBAL LIGATION  2011    Current Outpatient Medications  Medication Sig Dispense Refill  . B Complex-Biotin-FA (B-COMPLEX PO) Take 1 capsule by mouth daily.    . baclofen (LIORESAL) 20 MG tablet Take 1 tablet (20 mg total) by mouth at bedtime. 90 tablet 1  . celecoxib (CELEBREX) 200 MG capsule Take 1 capsule (200 mg total) by mouth 2 (two) times daily. 60 capsule 1  . cholecalciferol (VITAMIN D) 25 MCG (1000 UT) tablet Take 2,000 Units by mouth daily.    Marland Kitchen gabapentin (NEURONTIN) 300 MG capsule Take by mouth daily.    . rosuvastatin (CRESTOR) 20 MG tablet Take 1 tablet (20 mg total) by mouth daily. 90 tablet 3   No current facility-administered medications for this visit.     ALLERGIES: Meperidine hcl, Prochlorperazine edisylate, Propranolol, Trokendi xr [topiramate er], and Phentermine  Family History  Problem Relation Age of Onset  . Breast cancer Maternal Aunt   . Stroke Mother   . COPD Mother   . Bladder Cancer Father   . Melanoma Father   . Heart attack Father   . Diabetes Maternal Grandmother     Social History   Socioeconomic History  . Marital status: Married    Spouse name: Not on file  . Number of children: 3  . Years of education: Not on file  . Highest education level: Not on file  Occupational History  . Not on file  Tobacco Use  . Smoking status: Never Smoker  . Smokeless tobacco: Never Used  Vaping Use  . Vaping Use: Never used  Substance and Sexual Activity  . Alcohol use: Not Currently  . Drug use: Never  . Sexual activity: Yes    Birth control/protection: Surgical  Other Topics Concern  . Not on file   Social History Narrative   Lives with husband and 2 of her children   Right handed   Caffeine: 1 cup of coffee in the morning and occasionally sweet tea   Social Determinants of Health   Financial Resource Strain: Not on file  Food Insecurity: Not on file  Transportation Needs: Not on file  Physical Activity: Not on file  Stress: Not on file  Social Connections: Not on file  Intimate Partner Violence: Not on file    ROS  PHYSICAL EXAMINATION:    BP 122/78 (BP Location: Right Arm, Patient Position: Sitting, Cuff Size: Normal)     General appearance: alert, cooperative and appears stated age N  Pelvic: External genitalia:  no lesions              Urethra:  normal appearing urethra with no masses, tenderness or lesions              Bartholins and Skenes: normal                 Vagina: atrophic appearing vagina, able to insert one finger, uncomfortable with attempt to place 2 fingers vaginally              Cervix: no lesions              Bimanual Exam:  Uterus:  uterus absent              Adnexa: no mass, fullness, tenderness               Chaperone was present for exam.  1. Vaginal atrophy Leading to dyspareunia - estradiol (ESTRING) 2 MG vaginal ring; Place 2 mg vaginally every 3 (three) months. Insert a new ring into vagina every 3 months  Dispense: 1 each; Refill: 0 -if the ring is too expensive she would like the tablets (3 x a week) -Don't attempt intercourse for at least 2 weeks -Discussed lubrication -She should control rate and depth of penetration -She is overdue for a mammogram, needs to schedule it -F/U here in 2 month for an annual exam  2. Dyspareunia, female See above - estradiol (ESTRING) 2 MG vaginal ring; Place 2 mg vaginally every 3 (three) months. Insert a new ring into vagina every 3 months  Dispense: 1 each; Refill: 0

## 2020-11-09 NOTE — Patient Instructions (Signed)
Try Uberlube for lubrication.    Atrophic Vaginitis  Atrophic vaginitis is a condition in which the tissues that line the vagina become dry and thin. This condition is most common in women who have stopped having regular menstrual periods (are in menopause). This usually starts when a woman is 65 to 51 years old. That is the time when a woman's estrogen levels begin to decrease. Estrogen is a female hormone. It helps to keep the tissues of the vagina moist. It stimulates the vagina to produce a clear fluid that lubricates the vagina for sex. This fluid also protects the vagina from infection. Lack of estrogen can cause the lining of the vagina to get thinner and dryer. The vagina may also shrink in size. It may become less elastic. Atrophic vaginitis tends to get worse over time as a woman's estrogen level drops. What are the causes? This condition is caused by the normal drop in estrogen that happens around the time of menopause. What increases the risk? Certain conditions or situations may lower a woman's estrogen level, leading to a higher risk for atrophic vaginitis. You are more likely to develop this condition if:  You are taking medicines that block estrogen.  You have had your ovaries removed.  You are being treated for cancer with radiation or medicines (chemotherapy).  You have given birth or are breastfeeding.  You are older than age 76.  You smoke. What are the signs or symptoms? Symptoms of this condition include:  Pain, soreness, a feeling of pressure, or bleeding during sex (dyspareunia).  Vaginal burning, irritation, or itching.  Pain or bleeding when a speculum is used in a vaginal exam.  Having burning pain while urinating.  Vaginal discharge. In some cases, there are no symptoms. How is this diagnosed? This condition is diagnosed based on your medical history and a physical exam. This will include a pelvic exam that checks the vaginal tissues. Though rare, you  may also have other tests, including:  A urine test.  A test that checks the acid balance in your vagina (acid balance test). How is this treated? Treatment for this condition depends on how severe your symptoms are. Treatment may include:  Using an over-the-counter vaginal lubricant before sex.  Using a long-acting vaginal moisturizer.  Using low-dose estrogen for moderate to severe symptoms that do not respond to other treatments. Options include creams, tablets, and inserts (vaginal rings). Before you use a vaginal estrogen, tell your health care provider if you have a history of: ? Breast cancer. ? Endometrial cancer. ? Blood clots. If you are not sexually active and your symptoms are very mild, you may not need treatment. Follow these instructions at home: Medicines  Take over-the-counter and prescription medicines only as told by your health care provider.  Do not use herbal or alternative medicines unless your health care provider says that you can.  Use over-the-counter creams, lubricants, or moisturizers for dryness only as told by your health care provider. General instructions  If your atrophic vaginitis is caused by menopause, discuss all of your menopause symptoms and treatment options with your health care provider.  Do not douche.  Do not use products that can make your vagina dry. These include: ? Scented feminine sprays. ? Scented tampons. ? Scented soaps.  Vaginal sex can help to improve blood flow and elasticity of vaginal tissue. If you choose to have sex and it hurts, try using a water-soluble lubricant or moisturizer right before having sex. Contact a health  care provider if:  Your discharge looks different than normal.  Your vagina has an unusual smell.  You have new symptoms.  Your symptoms do not improve with treatment.  Your symptoms get worse. Summary  Atrophic vaginitis is a condition in which the tissues that line the vagina become dry  and thin. It is most common in women who have stopped having regular menstrual periods (are in menopause).  Treatment options include using vaginal lubricants and low-dose vaginal estrogen.  Contact a health care provider if your vagina has an unusual smell, or if your symptoms get worse or do not improve after treatment. This information is not intended to replace advice given to you by your health care provider. Make sure you discuss any questions you have with your health care provider. Document Revised: 12/24/2019 Document Reviewed: 12/24/2019 Elsevier Patient Education  [place].

## 2020-11-10 ENCOUNTER — Other Ambulatory Visit: Payer: Self-pay | Admitting: Obstetrics and Gynecology

## 2020-11-10 DIAGNOSIS — N952 Postmenopausal atrophic vaginitis: Secondary | ICD-10-CM

## 2020-11-10 MED ORDER — ESTRADIOL 10 MCG VA TABS: ORAL_TABLET | VAGINAL | 4 refills | Status: DC

## 2020-11-10 NOTE — Progress Notes (Signed)
Vaginal estrogen script sent. Mychart reply sent

## 2020-11-11 DIAGNOSIS — M25621 Stiffness of right elbow, not elsewhere classified: Secondary | ICD-10-CM | POA: Diagnosis not present

## 2020-11-11 DIAGNOSIS — M25541 Pain in joints of right hand: Secondary | ICD-10-CM | POA: Diagnosis not present

## 2020-11-11 DIAGNOSIS — M25521 Pain in right elbow: Secondary | ICD-10-CM | POA: Diagnosis not present

## 2020-11-11 DIAGNOSIS — M25531 Pain in right wrist: Secondary | ICD-10-CM | POA: Diagnosis not present

## 2020-11-14 DIAGNOSIS — M25541 Pain in joints of right hand: Secondary | ICD-10-CM | POA: Diagnosis not present

## 2020-11-14 DIAGNOSIS — M25531 Pain in right wrist: Secondary | ICD-10-CM | POA: Diagnosis not present

## 2020-11-14 DIAGNOSIS — M25521 Pain in right elbow: Secondary | ICD-10-CM | POA: Diagnosis not present

## 2020-11-14 DIAGNOSIS — M25621 Stiffness of right elbow, not elsewhere classified: Secondary | ICD-10-CM | POA: Diagnosis not present

## 2020-11-16 ENCOUNTER — Ambulatory Visit: Payer: BC Managed Care – PPO | Admitting: Internal Medicine

## 2020-11-16 DIAGNOSIS — E782 Mixed hyperlipidemia: Secondary | ICD-10-CM | POA: Diagnosis not present

## 2020-11-16 DIAGNOSIS — Z1231 Encounter for screening mammogram for malignant neoplasm of breast: Secondary | ICD-10-CM | POA: Diagnosis not present

## 2020-11-16 LAB — LIPID PANEL
Chol/HDL Ratio: 2.3 ratio (ref 0.0–4.4)
Cholesterol, Total: 172 mg/dL (ref 100–199)
HDL: 75 mg/dL (ref >39)
LDL Chol Calc (NIH): 86 mg/dL (ref 0–99)
Triglycerides: 54 mg/dL (ref 0–149)
VLDL Cholesterol Cal: 11 mg/dL (ref 5–40)

## 2020-11-18 DIAGNOSIS — M25531 Pain in right wrist: Secondary | ICD-10-CM | POA: Diagnosis not present

## 2020-11-18 DIAGNOSIS — M25521 Pain in right elbow: Secondary | ICD-10-CM | POA: Diagnosis not present

## 2020-11-18 DIAGNOSIS — M25541 Pain in joints of right hand: Secondary | ICD-10-CM | POA: Diagnosis not present

## 2020-11-18 DIAGNOSIS — M25621 Stiffness of right elbow, not elsewhere classified: Secondary | ICD-10-CM | POA: Diagnosis not present

## 2020-11-21 ENCOUNTER — Other Ambulatory Visit: Payer: Self-pay

## 2020-11-21 ENCOUNTER — Ambulatory Visit (INDEPENDENT_AMBULATORY_CARE_PROVIDER_SITE_OTHER): Payer: BC Managed Care – PPO | Admitting: Internal Medicine

## 2020-11-21 ENCOUNTER — Encounter: Payer: Self-pay | Admitting: Internal Medicine

## 2020-11-21 VITALS — BP 117/81 | HR 79 | Ht 64.0 in | Wt 158.2 lb

## 2020-11-21 DIAGNOSIS — R911 Solitary pulmonary nodule: Secondary | ICD-10-CM | POA: Diagnosis not present

## 2020-11-21 DIAGNOSIS — R931 Abnormal findings on diagnostic imaging of heart and coronary circulation: Secondary | ICD-10-CM | POA: Diagnosis not present

## 2020-11-21 DIAGNOSIS — M25531 Pain in right wrist: Secondary | ICD-10-CM | POA: Diagnosis not present

## 2020-11-21 DIAGNOSIS — M25521 Pain in right elbow: Secondary | ICD-10-CM | POA: Diagnosis not present

## 2020-11-21 DIAGNOSIS — E7800 Pure hypercholesterolemia, unspecified: Secondary | ICD-10-CM | POA: Diagnosis not present

## 2020-11-21 DIAGNOSIS — M25621 Stiffness of right elbow, not elsewhere classified: Secondary | ICD-10-CM | POA: Diagnosis not present

## 2020-11-21 DIAGNOSIS — M25541 Pain in joints of right hand: Secondary | ICD-10-CM | POA: Diagnosis not present

## 2020-11-21 MED ORDER — EZETIMIBE 10 MG PO TABS: 10.0000 mg | ORAL_TABLET | Freq: Every day | ORAL | 3 refills | Status: DC

## 2020-11-21 NOTE — Progress Notes (Signed)
LIPID CLINIC CONSULT NOTE  Chief Complaint:  Follow-up dyslipidemia  Primary Care Physician: Christine Flaming, MD  Primary Cardiologist:  None  HPI:  Christine Nash is a 50 y.o. female who is being seen today for the evaluation of dyslipidemia at the request of Christine Flaming, MD. This is a pleasant 49 year old female who unfortunately recently had to have surgery to have a right ulnar fracture and is currently in a cast and sling.  She was referred by Dr. Eliberto Nash for evaluation of dyslipidemia.  Wonderfully, she is actually lost 30 to 40 pounds recently with significant dietary changes and activity prior to this recent fracture.  Despite the weight loss, however her cholesterol did not significantly improve in fact it actually has increased compared to where it was about 2 years ago.  Total cholesterol now 281, triglycerides 58, HDL 70 and LDL 199.  She does not report a strong family history of heart disease.  I wonder if her HDL of 70 may be cardioprotective for her.  Overall she reports eating a very healthy sounding diet.  She is cut out a lot of sources of saturated fats.  11/21/2020  Christine Nash returns today for follow-up.  She seems to be tolerating rosuvastatin without any significant side effects.  Cholesterol has come down significantly.  Total cholesterol is now 172, triglycerides 54, HDL 75 and LDL of 86.  PMHx:  Past Medical History:  Diagnosis Date  . Anemia   . Anxiety   . Complication of anesthesia   . Depression   . Endometriosis   . Fracture of right ulnar styloid   . Hyperlipidemia   . PONV (postoperative nausea and vomiting)     Past Surgical History:  Procedure Laterality Date  . ABDOMINAL HYSTERECTOMY    . BUNIONECTOMY     2008  . CERVICAL SPINE SURGERY  2011,2012,2017   ACDF repair w/instrumentation  . CESAREAN SECTION  [id number]  . CHOLECYSTECTOMY N/A 01/17/2019   Procedure: LAPAROSCOPIC CHOLECYSTECTOMY WITH INTRAOPERATIVE CHOLANGIOGRAM;   Surgeon: Alphonsa Overall, MD;  Location: [facility];  Service: General;  Laterality: N/A;  . KNEE SURGERY     left knee  . PERCUTANEOUS PINNING Right 08/18/2020   Procedure: Closed reduction and percutaneous pinning  of right ulnar fracture;  Surgeon: Cindra Presume, MD;  Location: [facility];  Service: Plastics;  Laterality: Right;  60 min  . SHOULDER SURGERY     1996  . SUPRACERVICAL ABDOMINAL HYSTERECTOMY  2013  . TUBAL LIGATION  2011    FAMHx:  Family History  Problem Relation Age of Onset  . Breast cancer Maternal Aunt   . Stroke Mother   . COPD Mother   . Bladder Cancer Father   . Melanoma Father   . Heart attack Father   . Diabetes Maternal Grandmother     SOCHx:   reports that she has never smoked. She has never used smokeless tobacco. She reports previous alcohol use. She reports that she does not use drugs.  ALLERGIES:  Allergies  Allergen Reactions  . Meperidine Hcl Hives  . Prochlorperazine Edisylate     Other reaction(s): agitiation  . Propranolol     "brain fog" fatigue   . Trokendi [employer]] Other (See Comments)    "brain fog"   . Phentermine Palpitations    Chest pain / palpitations     ROS: Pertinent items noted in HPI and remainder of comprehensive ROS otherwise negative.  HOME MEDS: Current Outpatient Medications  on File Prior to Visit  Medication Sig Dispense Refill  . B Complex-Biotin-FA (B-COMPLEX PO) Take 1 capsule by mouth daily.    . baclofen (LIORESAL) 20 MG tablet Take 1 tablet (20 mg total) by mouth at bedtime. 90 tablet 1  . celecoxib (CELEBREX) 200 MG capsule Take 1 capsule (200 mg total) by mouth 2 (two) times daily. 60 capsule 1  . cholecalciferol (VITAMIN D) 25 MCG (1000 UT) tablet Take 2,000 Units by mouth daily.    . Estradiol 10 MCG TABS vaginal tablet Place one tablet vaginally qhs x 1 week, then change to 3 x a week 36 tablet 4  . rosuvastatin (CRESTOR) 20 MG tablet Take 1 tablet (20 mg total) by mouth daily.  90 tablet 3   No current facility-administered medications on file prior to visit.    LABS/IMAGING: No results found for this or any previous visit (from the past 48 hour(s)). No results found.  LIPID PANEL:    Component Value Date/Time   CHOL 172 11/16/2020 0932   TRIG 54 11/16/2020 0932   HDL 75 11/16/2020 0932   CHOLHDL 2.3 11/16/2020 0932   CHOLHDL 4 08/10/2020 1016   VLDL 11.6 08/10/2020 1016   LDLCALC 86 11/16/2020 0932    WEIGHTS: Wt Readings from Last 3 Encounters:  11/21/20 158 lb 3.2 oz (71.8 kg)  09/02/20 163 lb (73.9 kg)  08/18/20 163 lb 5.8 oz (74.1 kg)    VITALS: BP 117/81   Pulse 79   Ht 5\' 4"  (1.626 m)   Wt 158 lb 3.2 oz (71.8 kg)   SpO2 99%   BMI 27.15 kg/m   EXAM: Deferred  EKG: Deferred  ASSESSMENT: 1. Mixed dyslipidemia with LDL greater than 190 2. Recent significant intentional weight loss 3. Elevated CAC score of 42, 95th percentile for age and sex matched controls 4. Incidental pulmonary nodule-scheduled for 1 year CT follow-up  PLAN: 1.   Christine Nash has had a significant response to rosuvastatin.  LDL is now down to 86.  I do think there is a significant dietary component to this.  She did have a calcium score however which was elevated at 74, 95th percentile for age and sex matched controls.  Although the absolute numbers not significantly elevated, this does represent age advanced coronary disease and I think further aggressive treatment is warranted.  I would target her LDL to less than 70.  We discussed several options including increasing rosuvastatin, adding ezetimibe or continue to work with diet.  She wants to go ahead and add ezetimibe 10 mg daily.  We will repeat lipids in about 3 months and follow-up at that time.  Pixie Casino, MD, [facility], [place] Director of the Advanced Lipid Disorders &  Cardiovascular Risk Reduction Clinic Diplomate of the American Board of Clinical  Lipidology Attending Cardiologist  Direct Dial: [phone]  Fax: [phone]  Website:  [email or web address] 11/21/2020, 9:56 AM

## 2020-11-21 NOTE — Patient Instructions (Addendum)
Medication Instructions:  START zetia 10mg  daily   *If you need a refill on your cardiac medications before your next appointment, please call your pharmacy*   Lab Work: FASTING lab work in 3 months to check cholesterol   If you have labs (blood work) drawn today and your tests are completely normal, you will receive your results only by: Marland Kitchen MyChart Message (if you have MyChart) OR . A paper copy in the mail If you have any lab test that is abnormal or we need to change your treatment, we will call you to review the results.   Testing/Procedures: Chest CT in April 2023   Follow-Up: At [facility], you and your health needs are our priority.  As part of our continuing mission to provide you with exceptional heart care, we have created designated Provider Care Teams.  These Care Teams include your primary Cardiologist (physician) and Advanced Practice Providers (APPs -  Physician Assistants and Nurse Practitioners) who all work together to provide you with the care you need, when you need it.  We recommend signing up for the patient portal called "MyChart".  Sign up information is provided on this After Visit Summary.  MyChart is used to connect with patients for Virtual Visits (Telemedicine).  Patients are able to view lab/test results, encounter notes, upcoming appointments, etc.  Non-urgent messages can be sent to your provider as well.   To learn more about what you can do with MyChart, go to [email or web address].    Your next appointment:   3-4 month(s) - lipid   The format for your next appointment:   In Person or Virtual  Provider:   K. Mali Hilty, MD   Other Instructions

## 2020-11-25 DIAGNOSIS — M25541 Pain in joints of right hand: Secondary | ICD-10-CM | POA: Diagnosis not present

## 2020-11-25 DIAGNOSIS — M25621 Stiffness of right elbow, not elsewhere classified: Secondary | ICD-10-CM | POA: Diagnosis not present

## 2020-11-25 DIAGNOSIS — M25531 Pain in right wrist: Secondary | ICD-10-CM | POA: Diagnosis not present

## 2020-11-25 DIAGNOSIS — M25521 Pain in right elbow: Secondary | ICD-10-CM | POA: Diagnosis not present

## 2020-11-28 DIAGNOSIS — M25621 Stiffness of right elbow, not elsewhere classified: Secondary | ICD-10-CM | POA: Diagnosis not present

## 2020-11-28 DIAGNOSIS — M25521 Pain in right elbow: Secondary | ICD-10-CM | POA: Diagnosis not present

## 2020-11-28 DIAGNOSIS — M25531 Pain in right wrist: Secondary | ICD-10-CM | POA: Diagnosis not present

## 2020-11-28 DIAGNOSIS — M25541 Pain in joints of right hand: Secondary | ICD-10-CM | POA: Diagnosis not present

## 2020-12-02 DIAGNOSIS — M25541 Pain in joints of right hand: Secondary | ICD-10-CM | POA: Diagnosis not present

## 2020-12-02 DIAGNOSIS — M25621 Stiffness of right elbow, not elsewhere classified: Secondary | ICD-10-CM | POA: Diagnosis not present

## 2020-12-02 DIAGNOSIS — M25521 Pain in right elbow: Secondary | ICD-10-CM | POA: Diagnosis not present

## 2020-12-02 DIAGNOSIS — M25531 Pain in right wrist: Secondary | ICD-10-CM | POA: Diagnosis not present

## 2020-12-06 DIAGNOSIS — M25531 Pain in right wrist: Secondary | ICD-10-CM | POA: Diagnosis not present

## 2020-12-06 DIAGNOSIS — M25521 Pain in right elbow: Secondary | ICD-10-CM | POA: Diagnosis not present

## 2020-12-06 DIAGNOSIS — M25541 Pain in joints of right hand: Secondary | ICD-10-CM | POA: Diagnosis not present

## 2020-12-06 DIAGNOSIS — M25621 Stiffness of right elbow, not elsewhere classified: Secondary | ICD-10-CM | POA: Diagnosis not present

## 2020-12-07 ENCOUNTER — Ambulatory Visit: Payer: BC Managed Care – PPO | Admitting: Internal Medicine

## 2020-12-15 ENCOUNTER — Ambulatory Visit: Payer: BC Managed Care – PPO | Admitting: Plastic Surgery

## 2020-12-16 ENCOUNTER — Encounter (HOSPITAL_COMMUNITY): Payer: Self-pay | Admitting: Emergency Medicine

## 2020-12-16 ENCOUNTER — Emergency Department (HOSPITAL_COMMUNITY): Payer: BC Managed Care – PPO

## 2020-12-16 ENCOUNTER — Emergency Department (HOSPITAL_COMMUNITY)
Admission: EM | Admit: 2020-12-16 | Discharge: 2020-12-16 | Disposition: A | Discharge status: Home / Self Care | Payer: BC Managed Care – PPO | Attending: Emergency Medicine | Admitting: Emergency Medicine

## 2020-12-16 ENCOUNTER — Other Ambulatory Visit: Payer: Self-pay

## 2020-12-16 DIAGNOSIS — R197 Diarrhea, unspecified: Secondary | ICD-10-CM | POA: Diagnosis not present

## 2020-12-16 DIAGNOSIS — M47812 Spondylosis without myelopathy or radiculopathy, cervical region: Secondary | ICD-10-CM | POA: Diagnosis not present

## 2020-12-16 DIAGNOSIS — S199XXA Unspecified injury of neck, initial encounter: Secondary | ICD-10-CM | POA: Diagnosis not present

## 2020-12-16 DIAGNOSIS — R059 Cough, unspecified: Secondary | ICD-10-CM | POA: Insufficient documentation

## 2020-12-16 DIAGNOSIS — U099 Post covid-19 condition, unspecified: Secondary | ICD-10-CM | POA: Insufficient documentation

## 2020-12-16 DIAGNOSIS — M4804 Spinal stenosis, thoracic region: Secondary | ICD-10-CM | POA: Diagnosis not present

## 2020-12-16 DIAGNOSIS — M47813 Spondylosis without myelopathy or radiculopathy, cervicothoracic region: Secondary | ICD-10-CM | POA: Diagnosis not present

## 2020-12-16 DIAGNOSIS — R5383 Other fatigue: Secondary | ICD-10-CM | POA: Diagnosis not present

## 2020-12-16 DIAGNOSIS — W01198A Fall on same level from slipping, tripping and stumbling with subsequent striking against other object, initial encounter: Secondary | ICD-10-CM | POA: Diagnosis not present

## 2020-12-16 DIAGNOSIS — U071 COVID-19: Secondary | ICD-10-CM | POA: Diagnosis not present

## 2020-12-16 DIAGNOSIS — R5381 Other malaise: Secondary | ICD-10-CM | POA: Insufficient documentation

## 2020-12-16 DIAGNOSIS — M4319 Spondylolisthesis, multiple sites in spine: Secondary | ICD-10-CM | POA: Diagnosis not present

## 2020-12-16 DIAGNOSIS — Z9889 Other specified postprocedural states: Secondary | ICD-10-CM | POA: Diagnosis not present

## 2020-12-16 DIAGNOSIS — Z9049 Acquired absence of other specified parts of digestive tract: Secondary | ICD-10-CM | POA: Diagnosis not present

## 2020-12-16 DIAGNOSIS — S0990XA Unspecified injury of head, initial encounter: Secondary | ICD-10-CM | POA: Diagnosis not present

## 2020-12-16 DIAGNOSIS — R002 Palpitations: Secondary | ICD-10-CM | POA: Diagnosis not present

## 2020-12-16 DIAGNOSIS — M549 Dorsalgia, unspecified: Secondary | ICD-10-CM | POA: Insufficient documentation

## 2020-12-16 DIAGNOSIS — R0602 Shortness of breath: Secondary | ICD-10-CM | POA: Insufficient documentation

## 2020-12-16 DIAGNOSIS — Z981 Arthrodesis status: Secondary | ICD-10-CM | POA: Diagnosis not present

## 2020-12-16 DIAGNOSIS — M542 Cervicalgia: Secondary | ICD-10-CM | POA: Diagnosis not present

## 2020-12-16 DIAGNOSIS — R55 Syncope and collapse: Secondary | ICD-10-CM | POA: Insufficient documentation

## 2020-12-16 LAB — CBC
HCT: 34.4 % — ABNORMAL LOW (ref 36.0–46.0)
Hemoglobin: 11.8 g/dL — ABNORMAL LOW (ref 12.0–15.0)
MCH: 30.4 pg (ref 26.0–34.0)
MCHC: 34.3 g/dL (ref 30.0–36.0)
MCV: 88.7 fL (ref 80.0–100.0)
Platelets: 125 10*3/uL — ABNORMAL LOW (ref 150–400)
RBC: 3.88 MIL/uL (ref 3.87–5.11)
RDW: 13 % (ref 11.5–15.5)
WBC: 2.5 10*3/uL — ABNORMAL LOW (ref 4.0–10.5)
nRBC: 0 % (ref 0.0–0.2)

## 2020-12-16 LAB — URINALYSIS, ROUTINE W REFLEX MICROSCOPIC
Bilirubin Urine: NEGATIVE
Glucose, UA: NEGATIVE mg/dL
Hgb urine dipstick: NEGATIVE
Ketones, ur: NEGATIVE mg/dL
Leukocytes,Ua: NEGATIVE
Nitrite: NEGATIVE
Protein, ur: 30 mg/dL — AB
Specific Gravity, Urine: 1.02 (ref 1.005–1.030)
pH: 6 (ref 5.0–8.0)

## 2020-12-16 LAB — BASIC METABOLIC PANEL
Anion gap: 8 (ref 5–15)
BUN: 10 mg/dL (ref 6–20)
CO2: 24 mmol/L (ref 22–32)
Calcium: 8.9 mg/dL (ref 8.9–10.3)
Chloride: 103 mmol/L (ref 98–111)
Creatinine, Ser: 0.91 mg/dL (ref 0.44–1.00)
GFR, Estimated: >60 mL/min (ref >60)
Glucose, Bld: 106 mg/dL — ABNORMAL HIGH (ref 70–99)
Potassium: 3.7 mmol/L (ref 3.5–5.1)
Sodium: 135 mmol/L (ref 135–145)

## 2020-12-16 LAB — CBG MONITORING, ED: Glucose-Capillary: 93 mg/dL (ref 70–99)

## 2020-12-16 MED ORDER — METHOCARBAMOL 500 MG PO TABS: 500.0000 mg | ORAL_TABLET | Freq: Three times a day (TID) | ORAL | 0 refills | Status: DC | PRN

## 2020-12-16 MED ORDER — SODIUM CHLORIDE 0.9 % IV BOLUS
1000.0000 mL | Freq: Once | INTRAVENOUS | Status: AC
  Administered 2020-12-16: 1000 mL via INTRAVENOUS

## 2020-12-16 MED ORDER — HYDROMORPHONE HCL 1 MG/ML IJ SOLN
0.5000 mg | Freq: Once | INTRAMUSCULAR | Status: AC
Start: 2020-12-16 — End: 2020-12-16
  Administered 2020-12-16: 0.5 mg via INTRAVENOUS
  Filled 2020-12-16: qty 1

## 2020-12-16 MED ORDER — IOHEXOL 350 MG/ML SOLN
75.0000 mL | Freq: Once | INTRAVENOUS | Status: AC
  Administered 2020-12-16: 75 mL via INTRAVENOUS

## 2020-12-16 MED ORDER — METHOCARBAMOL 1000 MG/10ML IJ SOLN
1000.0000 mg | INTRAVENOUS | Status: AC
  Administered 2020-12-16: 1000 mg via INTRAVENOUS
  Filled 2020-12-16: qty 10

## 2020-12-16 MED ORDER — METHOCARBAMOL 1000 MG/10ML IJ SOLN
1000.0000 mg | Freq: Once | INTRAMUSCULAR | Status: DC
  Filled 2020-12-16: qty 10

## 2020-12-16 MED ORDER — KETOROLAC TROMETHAMINE 15 MG/ML IJ SOLN
15.0000 mg | Freq: Once | INTRAMUSCULAR | Status: AC
  Administered 2020-12-16: 15 mg via INTRAVENOUS
  Filled 2020-12-16: qty 1

## 2020-12-16 NOTE — ED Triage Notes (Signed)
Pt states syncope and fall this morning while going to the bathroom. Pt disoriented initially per son. C/o neck pain, upper back pain, and facial pain (left side). States she continues to feel dizzy. No blood thinners.

## 2020-12-16 NOTE — ED Provider Notes (Signed)
[facility] EMERGENCY DEPARTMENT Provider Note  CSN: [id number] Arrival date & time: 12/16/20 0416  Chief Complaint(s) Fall and Loss of Consciousness  HPI Christine Nash is a 50 y.o. female    Loss of Consciousness Episode history:  Single Most recent episode:  Today Timing: once. Progression:  Resolved Chronicity:  New Context: standing up and urination   Witnessed: no   Associated symptoms: malaise/fatigue, palpitations (rapid) and shortness of breath    Because in syncope patient fell striking her left face.  She is complaining of neck pain and upper back pain.  Worse with movement and palpation.  Alleviated by immobility.  Patient reports prior anterior approach cervical fusion.  She denies any numbness or tingling to extremities.  No weakness.  Patient reports that she is COVID-positive as of June 5.  She has had some cough.  Initially had fevers which resolved.  No nausea or vomiting.  Had a few bouts of diarrhea yesterday.  No associated chest pain or abdominal pain.   Past Medical History Past Medical History:  Diagnosis Date   Anemia    Anxiety    Complication of anesthesia    Depression    Endometriosis    Fracture of right ulnar styloid    Hyperlipidemia    PONV (postoperative nausea and vomiting)    Patient Active Problem List   Diagnosis Date Noted   Cervicogenic headache 10/18/2019   Generalized anxiety disorder 02/11/2018   Hormone replacement therapy (HRT) 02/11/2018   Spinal stenosis of cervical region 02/11/2018   Hyperlipidemia 02/11/2018   Vitamin D deficiency 02/11/2018   Attention deficit hyperactivity disorder, predominantly inattentive type 06/27/2011   Home Medication(s) Prior to Admission medications   Medication Sig Start Date End Date Taking? Authorizing Provider  acetaminophen (TYLENOL) 500 MG tablet Take 1,000 mg by mouth every 6 (six) hours as needed for moderate pain or headache.   Yes [provider]  B Complex-Biotin-FA (B-COMPLEX PO) Take 1 capsule by mouth daily.   Yes [provider]  baclofen (LIORESAL) 20 MG tablet Take 1 tablet (20 mg total) by mouth at bedtime. 09/29/20  Yes Orma Flaming, MD  celecoxib (CELEBREX) 200 MG capsule Take 1 capsule (200 mg total) by mouth 2 (two) times daily. 11/02/20  Yes Cindra Presume, MD  cholecalciferol (VITAMIN D) 25 MCG (1000 UT) tablet Take 4,000 Units by mouth daily.   Yes [provider]  Estradiol 10 MCG TABS vaginal tablet Place one tablet vaginally qhs x 1 week, then change to 3 x a week Patient taking differently: Place 10 mcg vaginally 3 (three) times a week. 11/10/20  Yes Salvadore Dom, MD  ezetimibe (ZETIA) 10 MG tablet Take 1 tablet (10 mg total) by mouth daily. 11/21/20 02/19/21 Yes Hilty, Nadean Corwin, MD  ibuprofen (ADVIL) 200 MG tablet Take 800 mg by mouth every 6 (six) hours as needed for mild pain or headache.   Yes [provider]  methocarbamol (ROBAXIN) 500 MG tablet Take 1 tablet (500 mg total) by mouth every 8 (eight) hours as needed for muscle spasms. 12/16/20  Yes Davonna Belling, MD  rosuvastatin (CRESTOR) 20 MG tablet Take 1 tablet (20 mg total) by mouth daily. 09/02/20 12/16/20 Yes Hilty, Nadean Corwin, MD  Past Surgical History Past Surgical History:  Procedure Laterality Date   ABDOMINAL HYSTERECTOMY     BUNIONECTOMY     2008   CERVICAL SPINE SURGERY  2011,2012,2017   ACDF repair w/instrumentation   CESAREAN SECTION  0932,3557   CHOLECYSTECTOMY N/A 01/17/2019   Procedure: LAPAROSCOPIC CHOLECYSTECTOMY WITH INTRAOPERATIVE CHOLANGIOGRAM;  Surgeon: Alphonsa Overall, MD;  Location: [facility];  Service: General;  Laterality: N/A;   KNEE SURGERY     left knee   PERCUTANEOUS PINNING Right 08/18/2020   Procedure: Closed reduction and percutaneous pinning  of right  ulnar fracture;  Surgeon: Cindra Presume, MD;  Location: [place];  Service: Plastics;  Laterality: Right;  60 min   [place]   SUPRACERVICAL ABDOMINAL HYSTERECTOMY  2013   TUBAL LIGATION  2011   Family History Family History  Problem Relation Age of Onset   Breast cancer Maternal Aunt    Stroke Mother    COPD Mother    Bladder Cancer Father    Melanoma Father    Heart attack Father    Diabetes Maternal Grandmother     Social History Social History   Tobacco Use   Smoking status: Never   Smokeless tobacco: Never  Vaping Use   Vaping Use: Never used  Substance Use Topics   Alcohol use: Not Currently   Drug use: Never   Allergies Meperidine hcl, Prochlorperazine edisylate, Propranolol, Trokendi xr [topiramate er], and Phentermine  Review of Systems Review of Systems  Constitutional:  Positive for malaise/fatigue.  Respiratory:  Positive for shortness of breath.   Cardiovascular:  Positive for palpitations (rapid) and syncope.  All other systems are reviewed and are negative for acute change except as noted in the HPI  Physical Exam Vital Signs  I have reviewed the triage vital signs BP 108/79 (BP Location: Left Arm)   Pulse 73   Temp 97.6 F (36.4 C) (Oral)   Resp (!) 22   Ht 5\' 4"  (1.626 m)   Wt 71.8 kg   SpO2 100%   BMI 27.17 kg/m   Physical Exam Constitutional:      General: She is not in acute distress.    Appearance: She is well-developed. She is not diaphoretic.  HENT:     Head: Normocephalic and atraumatic.     Right Ear: External ear normal.     Left Ear: External ear normal.     Nose: Nose normal.  Eyes:     General: No scleral icterus.       Right eye: No discharge.        Left eye: No discharge.     Conjunctiva/sclera: Conjunctivae normal.     Pupils: Pupils are equal, round, and reactive to light.  Cardiovascular:     Rate and Rhythm: Normal rate and regular rhythm.     Pulses:          Radial pulses  are 2+ on the right side and 2+ on the left side.       Dorsalis pedis pulses are 2+ on the right side and 2+ on the left side.     Heart sounds: Normal heart sounds. No murmur heard.   No friction rub. No gallop.  Pulmonary:     Effort: Pulmonary effort is normal. No respiratory distress.     Breath sounds: Normal breath sounds. No stridor. No wheezing.  Abdominal:     General: There is no distension.     Palpations:  Abdomen is soft.     Tenderness: There is no abdominal tenderness.  Musculoskeletal:     Right hand: Normal strength. Normal sensation.     Left hand: Normal strength. Normal sensation.     Cervical back: Normal range of motion and neck supple. Tenderness and bony tenderness present. Spinous process tenderness and muscular tenderness present.     Thoracic back: Tenderness and bony tenderness present.     Lumbar back: No bony tenderness.     Comments: Clavicles stable. Chest stable to AP/Lat compression. Pelvis stable to Lat compression. No obvious extremity deformity. No chest or abdominal wall contusion.  Skin:    General: Skin is warm and dry.     Findings: No erythema or rash.  Neurological:     Mental Status: She is alert and oriented to person, place, and time.     Comments: Moving all extremities    ED Results and Treatments Labs (all labs ordered are listed, but only abnormal results are displayed) Labs Reviewed  BASIC METABOLIC PANEL - Abnormal; Notable for the following components:      Result Value   Glucose, Bld 106 (*)    All other components within normal limits  CBC - Abnormal; Notable for the following components:   WBC 2.5 (*)    Hemoglobin 11.8 (*)    HCT 34.4 (*)    Platelets 125 (*)    All other components within normal limits  URINALYSIS, ROUTINE W REFLEX MICROSCOPIC - Abnormal; Notable for the following components:   APPearance HAZY (*)    Protein, ur 30 (*)    Bacteria, UA MANY (*)    All other components within normal limits  CBG  MONITORING, ED                                                                                                                         EKG  EKG Interpretation  Date/Time:  Friday December 16 2020 04:21:05 EDT Ventricular Rate:  74 PR Interval:  158 QRS Duration: 90 QT Interval:  418 QTC Calculation: 463 R Axis:   83 Text Interpretation: Normal sinus rhythm Normal ECG No acute changes Confirmed by Addison Lank ([id number]) on 12/16/2020 5:19:00 AM        Radiology No results found.  Pertinent labs & imaging results that were available during my care of the patient were reviewed by me and considered in my medical decision making (see chart for details).  Medications Ordered in ED Medications  sodium chloride 0.9 % bolus 1,000 mL (0 mLs Intravenous Stopped 12/16/20 0731)  HYDROmorphone (DILAUDID) injection 0.5 mg (0.5 mg Intravenous Given 12/16/20 0555)  methocarbamol (ROBAXIN) 1,000 mg in dextrose 5 % 100 mL IVPB (0 mg Intravenous Stopped 12/16/20 0850)  iohexol (OMNIPAQUE) 350 MG/ML injection 75 mL (75 mLs Intravenous Contrast Given 12/16/20 0702)  ketorolac (TORADOL) 15 MG/ML injection 15 mg (15 mg Intravenous Given 12/16/20 0934)  Procedures Procedures  (including critical care time)  Medical Decision Making / ED Course I have reviewed the nursing notes for this encounter and the patient's prior records (if available in EHR or on provided paperwork).   Christine Nash was evaluated in Emergency Department on 12/17/2020 for the symptoms described in the history of present illness. She was evaluated in the context of the global COVID-19 pandemic, which necessitated consideration that the patient might be at risk for infection with the SARS-CoV-2 virus that causes COVID-19. Institutional protocols and algorithms that pertain to the evaluation of patients at  risk for COVID-19 are in a state of rapid change based on information released by regulatory bodies including the CDC and federal and state organizations. These policies and algorithms were followed during the patient's care in the ED.  Syncopal episode at home after voiding and standing up. Sustained facial trauma. No anticoagulation. Is complaining of neck pain and upper back pain.  No extremity weakness or loss of sensation. COVID-positive.  EKG without acute ischemic changes, dysrhythmias, blocks, [place], HOCM, epsilon waves. Labs reassuring without leukocytosis or severe anemia.  No significant electrolyte derangements or renal sufficiency. UA with bacteriuria but not concerning for UTI.  Given that she is COVID-positive and had some shortness of breath and tachycardia just prior to syncopal episode, will obtain a CTA to rule out PE.  Will also obtain CT head, cervical spine and thoracic spine to rule out any traumatic injuries.  Patient provided with IV fluids, IV pain medicine.  CT is without evidence of acute injury. CT PE study negative.  OOB w/o difficulty.      Final Clinical Impression(s) / ED Diagnoses Final diagnoses:  Syncope and collapse  Minor head injury, initial encounter  Neck pain  Upper back pain   The patient appears reasonably screened and/or stabilized for discharge and I doubt any other medical condition or other [facility] requiring further screening, evaluation, or treatment in the ED at this time prior to discharge. Safe for discharge with strict return precautions.  Disposition: Discharge  Condition: Good  I have discussed the results, Dx and Tx plan with the patient/family who expressed understanding and agree(s) with the plan. Discharge instructions discussed at length. The patient/family was given strict return precautions who verbalized understanding of the instructions. No further questions at time of discharge.    ED Discharge Orders           Ordered    methocarbamol (ROBAXIN) 500 MG tablet  Every 8 hours PRN        12/16/20 0923              Follow Up: Orma Flaming, [place] [id number] [phone]  Call  to schedule an appointment for close follow up      This chart was dictated using voice recognition software.  Despite best efforts to proofread,  errors can occur which can change the documentation meaning.    Fatima Blank, MD 12/17/20 [phone]

## 2020-12-16 NOTE — ED Notes (Signed)
Pt oob without difficulty to use bedside commode

## 2020-12-16 NOTE — Discharge Instructions (Addendum)
The methocarbamol is a muscle relaxer.  You can take either it or the baclofen you are already on.  Do not take both.

## 2020-12-27 ENCOUNTER — Telehealth: Payer: Self-pay | Admitting: Nurse Practitioner

## 2020-12-27 NOTE — Telephone Encounter (Signed)
Called patient regarding MyChart message regarding syncope and pre-syncopal episodes that have occurred recently. She fell on 6/10, was evaluated in the ED and felt to be stable. Today she felt like she was going to black out while driving her daughter to the pediatrician and had to pull over. Recently prescribed meclizine by PCP but PCP has transitioned out of primary care and patient has not found new provider. She had one episode where she felt very dizzy and felt the meclizine helped but at other times meclizine does not help. Occasionally feels heart skipping a beat, sometimes associated with pre-syncope but not consistently. She does not monitor BP at home but is agreeable to get a BP cuff and will record readings. Onset of symptoms began when she had Covid but also around the time she started Zetia. She would like to hold the Zetia to see if symptoms improve. I advised her to hold Zetia for 4 weeks to note improvement. She agrees to contact [place] primary to find a new PCP. She has been seen by Dr. Debara Pickett for hypercholesteremia but would appreciate his input regarding wearing a heart monitor and is agreeable to come in for a general cardiology appointment if needed. I advised that I will forward message to Dr. Debara Pickett for advice and also asked her to work on getting PCP appointment. She verbalized understanding and agreement and was grateful for the call.

## 2020-12-27 NOTE — Telephone Encounter (Signed)
Happy to see her in a cardiology visit for palpitations to determine if she needs a monitor.  Dr Lemmie Evens

## 2020-12-28 NOTE — Telephone Encounter (Signed)
Spoke with patient and scheduled her for MyChart video visit with Dr. Debara Pickett on Thursday 6/23.     Patient Consent for Virtual Visit    Christine Nash has provided verbal consent on 12/28/2020 for a virtual visit (video or telephone).   CONSENT FOR VIRTUAL VISIT FOR:  Christine Nash  By participating in this virtual visit I agree to the following:  I hereby voluntarily request, consent and authorize [facility] and its employed or contracted physicians, physician assistants, nurse practitioners or other licensed health care professionals (the Practitioner), to provide me with telemedicine health care services (the "Services") as deemed necessary by the treating Practitioner. I acknowledge and consent to receive the Services by the Practitioner via telemedicine. I understand that the telemedicine visit will involve communicating with the Practitioner through live audiovisual communication technology and the disclosure of certain medical information by electronic transmission. I acknowledge that I have been given the opportunity to request an in-person assessment or other available alternative prior to the telemedicine visit and am voluntarily participating in the telemedicine visit.  I understand that I have the right to withhold or withdraw my consent to the use of telemedicine in the course of my care at any time, without affecting my right to future care or treatment, and that the Practitioner or I may terminate the telemedicine visit at any time. I understand that I have the right to inspect all information obtained and/or recorded in the course of the telemedicine visit and may receive copies of available information for a reasonable fee.  I understand that some of the potential risks of receiving the Services via telemedicine include:  Delay or interruption in medical evaluation due to technological equipment failure or disruption; Information transmitted may not be sufficient  (e.g. poor resolution of images) to allow for appropriate medical decision making by the Practitioner; and/or  In rare instances, security protocols could fail, causing a breach of personal health information.  Furthermore, I acknowledge that it is my responsibility to provide information about my medical history, conditions and care that is complete and accurate to the best of my ability. I acknowledge that Practitioner's advice, recommendations, and/or decision may be based on factors not within their control, such as incomplete or inaccurate data provided by me or distortions of diagnostic images or specimens that may result from electronic transmissions. I understand that the practice of medicine is not an exact science and that Practitioner makes no warranties or guarantees regarding treatment outcomes. I acknowledge that a copy of this consent can be made available to me via my patient portal ([facility]), or I can request a printed copy by calling the office of [facility].    I understand that my insurance will be billed for this visit.   I have read or had this consent read to me. I understand the contents of this consent, which adequately explains the benefits and risks of the Services being provided via telemedicine.  I have been provided ample opportunity to ask questions regarding this consent and the Services and have had my questions answered to my satisfaction. I give my informed consent for the services to be provided through the use of telemedicine in my medical care

## 2020-12-29 ENCOUNTER — Ambulatory Visit (INDEPENDENT_AMBULATORY_CARE_PROVIDER_SITE_OTHER): Payer: BC Managed Care – PPO

## 2020-12-29 ENCOUNTER — Encounter: Payer: Self-pay | Admitting: Internal Medicine

## 2020-12-29 ENCOUNTER — Other Ambulatory Visit: Payer: Self-pay

## 2020-12-29 ENCOUNTER — Ambulatory Visit (INDEPENDENT_AMBULATORY_CARE_PROVIDER_SITE_OTHER): Payer: BC Managed Care – PPO | Admitting: Plastic Surgery

## 2020-12-29 ENCOUNTER — Telehealth (INDEPENDENT_AMBULATORY_CARE_PROVIDER_SITE_OTHER): Payer: BC Managed Care – PPO | Admitting: Internal Medicine

## 2020-12-29 VITALS — Ht 64.0 in | Wt 147.5 lb

## 2020-12-29 DIAGNOSIS — R55 Syncope and collapse: Secondary | ICD-10-CM

## 2020-12-29 DIAGNOSIS — S63014A Dislocation of distal radioulnar joint of right wrist, initial encounter: Secondary | ICD-10-CM | POA: Diagnosis not present

## 2020-12-29 DIAGNOSIS — R002 Palpitations: Secondary | ICD-10-CM

## 2020-12-29 NOTE — Progress Notes (Unsigned)
Patient enrolled for [employer] to mail a ZIO XT monitor to her home.

## 2020-12-29 NOTE — Progress Notes (Signed)
   Referring Provider Orma Flaming, MD [place],  [place] [address]   CC:  Chief Complaint  Patient presents with   Follow-up      Christine Nash is an 50 y.o. female.  HPI: Patient presents 4 months out from closed reduction of a right DRUJ dislocation with pinning of an ulnar styloid fracture.  She feels like she is making progress.  She had been doing quite well with therapy but then she got COVID and had a fall at home and this stalled her progress a little bit.  Otherwise she does not have much pain and is still working on regaining range of motion.  Review of Systems General: Denies fevers and chills  Physical Exam Vitals with BMI 12/29/2020 12/16/2020 12/16/2020  Height 5\' 4"  - -  Weight 147 lbs 8 oz - -  BMI 32.35 - -  Systolic - 573 220  Diastolic - 73 73  Pulse - 55 55    General:  No acute distress,  Alert and oriented, Non-Toxic, Normal speech and affect Right hand: Fingers well-perfused with normal cap refill to palp radial pulse.  Sensation is intact throughout.  She has near full range of motion of her fingers with full extension and can almost get all of her fingers down into her palm.  She has 10 to 15 degrees of wrist flexion and extension.  She has reasonable pronation but still with some limitations with supination.  Assessment/Plan Patient presents 4 months out from a DRUJ dislocation and pinning of an ulnar styloid fracture.  I think overall her progress is reasonable.  She is going to get back in with her therapist to try to get some more improvement in range of motion which I think is a good idea.  I will plan to see her again in a few months.  All of her questions were answered.  Cindra Presume 12/29/2020, 12:40 PM

## 2020-12-29 NOTE — Progress Notes (Signed)
Virtual Visit via Video Note   This visit type was conducted due to national recommendations for restrictions regarding the COVID-19 Pandemic (e.g. social distancing) in an effort to limit this patient's exposure and mitigate transmission in our community.  Due to her co-morbid illnesses, this patient is at least at moderate risk for complications without adequate follow up.  This format is felt to be most appropriate for this patient at this time.  All issues noted in this document were discussed and addressed.  A limited physical exam was performed with this format.  Please refer to the patient's chart for her consent to telehealth for [facility]      Date:  12/29/2020   ID:  Christine Nash, DOB 23-Jun-1971, MRN [id number] The patient was identified using 2 identifiers.  Evaluation Performed:  Follow-Up Visit  Patient Location:  [place] [address]  Provider location:   [address] [place], [place] [address]  PCP:  Orma Flaming, MD  Cardiologist:  None Electrophysiologist:  None   Chief Complaint:  Palpitations, near-syncope  History of Present Illness:    Christine Nash is a 50 y.o. female who presents via audio/video conferencing for a telehealth visit today.  Christine Nash is a pleasant 50 year old female who I have been following for dyslipidemia at the request of her primary provider.  Recently she called in because of a near syncopal event that she had when she was driving and asked if I could see her for cardiology evaluation.  She does have a history of palpitations in the past from what I can tell and had been previously on propranolol which caused side effects as well as phentermine for weight loss which also caused her worsening palpitations.  She said she had was in the car and driving and she nearly blacked out.  She felt very lightheaded and light in her chest.  She thought her heart was racing.  She did  pull over to the side of the road and her husband came to pick them up.  She has had a couple episodes like this.  She did contract COVID-19 in early June, I believe around June 5 and says that she is almost completely recovered from this.  She had cough, malaise, congestion and other symptoms.  She was unvaccinated.  She also wonders if her ezetimibe could be an issue.  She had recently started that at my request to drive LDL cholesterol lower because of age advanced coronary calcification.  Zetia rarely can cause palpitations, therefore it is reasonable to consider holding it to see if this improves things.  The patient does not have symptoms concerning for COVID-19 infection (fever, chills, cough, or new SHORTNESS OF BREATH).    Prior CV studies:   The following studies were reviewed today:  Chart reviewed, lab work  PMHx:  Past Medical History:  Diagnosis Date   Anemia    Anxiety    Complication of anesthesia    Depression    Endometriosis    Fracture of right ulnar styloid    Hyperlipidemia    PONV (postoperative nausea and vomiting)     Past Surgical History:  Procedure Laterality Date   ABDOMINAL HYSTERECTOMY     BUNIONECTOMY     2008   CERVICAL SPINE SURGERY  2011,2012,2017   ACDF repair w/instrumentation   CESAREAN SECTION  [id number],4536   CHOLECYSTECTOMY N/A 01/17/2019   Procedure: LAPAROSCOPIC CHOLECYSTECTOMY WITH INTRAOPERATIVE CHOLANGIOGRAM;  Surgeon: Alphonsa Overall, MD;  Location: [facility];  Service: General;  Laterality: N/A;   KNEE SURGERY     left knee   PERCUTANEOUS PINNING Right 08/18/2020   Procedure: Closed reduction and percutaneous pinning  of right ulnar fracture;  Surgeon: Cindra Presume, MD;  Location: [facility];  Service: Plastics;  Laterality: Right;  60 min   [place]   SUPRACERVICAL ABDOMINAL HYSTERECTOMY  2013   TUBAL LIGATION  2011    FAMHx:  Family History  Problem Relation Age of Onset   Breast cancer Maternal  Aunt    Stroke Mother    COPD Mother    Bladder Cancer Father    Melanoma Father    Heart attack Father    Diabetes Maternal Grandmother     SOCHx:   reports that she has never smoked. She has never used smokeless tobacco. She reports previous alcohol use. She reports that she does not use drugs.  ALLERGIES:  Allergies  Allergen Reactions   Meperidine Hcl Hives   Prochlorperazine Edisylate     Other reaction(s): agitiation   Propranolol     "brain fog" fatigue    T[employer]] Other (See Comments)    "brain fog"    Phentermine Palpitations    Chest pain / palpitations     MEDS:  Current Meds  Medication Sig   acetaminophen (TYLENOL) 500 MG tablet Take 1,000 mg by mouth every 6 (six) hours as needed for moderate pain or headache.   B Complex-Biotin-FA (B-COMPLEX PO) Take 1 capsule by mouth daily.   baclofen (LIORESAL) 20 MG tablet Take 1 tablet (20 mg total) by mouth at bedtime.   celecoxib (CELEBREX) 200 MG capsule Take 1 capsule (200 mg total) by mouth 2 (two) times daily.   cholecalciferol (VITAMIN D) 25 MCG (1000 UT) tablet Take 4,000 Units by mouth daily.   Estradiol 10 MCG TABS vaginal tablet Place one tablet vaginally qhs x 1 week, then change to 3 x a week (Patient taking differently: Place 10 mcg vaginally 3 (three) times a week.)   ezetimibe (ZETIA) 10 MG tablet Take 1 tablet (10 mg total) by mouth daily.   ibuprofen (ADVIL) 200 MG tablet Take 800 mg by mouth every 6 (six) hours as needed for mild pain or headache.   rosuvastatin (CRESTOR) 20 MG tablet Take 1 tablet (20 mg total) by mouth daily.     ROS: Pertinent items noted in HPI and remainder of comprehensive ROS otherwise negative.  Labs/Other Tests and Data Reviewed:    Recent Labs: 08/10/2020: ALT 13; TSH 1.52 12/16/2020: BUN 10; Creatinine, Ser 0.91; Hemoglobin 11.8; Platelets 125; Potassium 3.7; Sodium 135   Recent Lipid Panel Lab Results  Component Value Date/Time   CHOL 172  11/16/2020 09:32 AM   TRIG 54 11/16/2020 09:32 AM   HDL 75 11/16/2020 09:32 AM   CHOLHDL 2.3 11/16/2020 09:32 AM   CHOLHDL 4 08/10/2020 10:16 AM   LDLCALC 86 11/16/2020 09:32 AM    Wt Readings from Last 3 Encounters:  12/29/20 147 lb 8 oz (66.9 kg)  12/16/20 158 lb 4.6 oz (71.8 kg)  11/21/20 158 lb 3.2 oz (71.8 kg)     Exam:    Vital Signs:  Ht 5\' 4"  (1.626 m)   Wt 147 lb 8 oz (66.9 kg)   BMI 25.32 kg/m    General appearance: alert, no distress, and thin Lungs: no visual respiratory difficulty Abdomen: normal weight Extremities: extremities normal, atraumatic,  no cyanosis or edema Skin: Skin color, texture, turgor normal. No rashes or lesions Neurologic: Grossly normal Psych: Pleasant  ASSESSMENT & PLAN:    Palpitations Near-syncope  Christine Nash has been describing a couple of near syncopal episodes, 1 of which was significant enough that caused her to pull off the road while trying to drive.  She felt light in her chest and thought her heart was racing.  She does have a history of palpitations previously.  This certainly could have been an arrhythmia.  Would recommend a 2-week ZIO monitor to further evaluate.  Also because of her dyslipidemia she was recently placed on ezetimibe.  Recommend to stop that at this point and see if her symptoms improve.  We might rechallenge her later.  She already has follow-up scheduled in September which I would recommend she keep and if further adjustments or other treatments are necessary after her monitor results return I can do that prior to the visit.  COVID-19 Education: The signs and symptoms of COVID-19 were discussed with the patient and how to seek care for testing (follow up with PCP or arrange E-visit).  The importance of social distancing was discussed today.  Patient Risk:   After full review of this patients clinical status, I feel that they are at least moderate risk at this time.  Time:   Today, I have spent 25 minutes with  the patient with telehealth technology discussing palpitations, presyncope, avoiding stimulants, mechanisms to try to break tachycardia such as vagal maneuvers.     Medication Adjustments/Labs and Tests Ordered: Current medicines are reviewed at length with the patient today.  Concerns regarding medicines are outlined above.   Tests Ordered: Orders Placed This Encounter  Procedures   LONG TERM MONITOR (3-14 DAYS)    Medication Changes: No orders of the defined types were placed in this encounter.   Disposition:  in 3 month(s)  Pixie Casino, MD, [facility], [place] Director of the Advanced Lipid Disorders &  Cardiovascular Risk Reduction Clinic Diplomate of the American Board of Clinical Lipidology Attending Cardiologist  Direct Dial: [phone]  Fax: [phone]  Website:  [email or web address]  Pixie Casino, MD  12/29/2020 9:37 AM

## 2020-12-29 NOTE — Patient Instructions (Signed)
Medication Instructions:  HOLD (DO NOT TAKE) ZETIA FOR NOW *If you need a refill on your cardiac medications before your next appointment, please call your pharmacy*  Testing/Procedures:  ZIO XT- Long Term Monitor Instructions   Your physician has requested you wear your ZIO patch monitor 14 days.   This is a single patch monitor.  [employer] supplies one patch monitor per enrollment.  Additional stickers are not available.   Please do not apply patch if you will be having a Nuclear Stress Test, Echocardiogram, Cardiac CT, MRI, or Chest Xray during the time frame you would be wearing the monitor. The patch cannot be worn during these tests.  You cannot remove and re-apply the ZIO XT patch monitor.   Your ZIO patch monitor will be sent USPS Priority mail from [facility] directly to your home address. The monitor may also be mailed to a PO BOX if home delivery is not available.   It may take 3-5 days to receive your monitor after you have been enrolled.   Once you have received you monitor, please review enclosed instructions.  Your monitor has already been registered assigning a specific monitor serial # to you.   Applying the monitor   Shave hair from upper left chest.   Hold abrader disc by orange tab.  Rub abrader in 40 strokes over left upper chest as indicated in your monitor instructions.   Clean area with 4 enclosed alcohol pads .  Use all pads to assure are is cleaned thoroughly.  Let dry.   Apply patch as indicated in monitor instructions.  Patch will be place under collarbone on left side of chest with arrow pointing upward.   Rub patch adhesive wings for 2 minutes.Remove white label marked "1".  Remove white label marked "2".  Rub patch adhesive wings for 2 additional minutes.   While looking in a mirror, press and release button in center of patch.  A small green light will flash 3-4 times .  This will be your only indicator the monitor has been turned on.     Do not  shower for the first 24 hours.  You may shower after the first 24 hours.   Press button if you feel a symptom. You will hear a small click.  Record Date, Time and Symptom in the Patient Log Book.   When you are ready to remove patch, follow instructions on last 2 pages of Patient Log Book.  Stick patch monitor onto last page of Patient Log Book.   Place Patient Log Book in [place] box.  Use locking tab on box and tape box closed securely.  The [employer] has [employer] on it.  Please place in mailbox as soon as possible.  Your physician should have your test results approximately 7 days after the monitor has been mailed back to [facility].   Call [facility] at [phone] if you have questions regarding your ZIO XT patch monitor.  Call them immediately if you see an orange light blinking on your monitor.   If your monitor falls off in less than 4 days contact our Monitor department at [phone].  If your monitor becomes loose or falls off after 4 days call [employer] at [phone] for suggestions on securing your monitor.    Follow-Up: At [facility], you and your health needs are our priority.  As part of our continuing mission to provide you with exceptional heart care, we have created designated Provider Care  Teams.  These Care Teams include your primary Cardiologist (physician) and Advanced Practice Providers (APPs -  Physician Assistants and Nurse Practitioners) who all work together to provide you with the care you need, when you need it.  Your next appointment:   KEEP FOLLOW UP AS SCHEDULED FOR 9/30  The format for your next appointment:   In Person  Provider:   K. Mali Hilty, MD

## 2020-12-31 DIAGNOSIS — R002 Palpitations: Secondary | ICD-10-CM

## 2020-12-31 DIAGNOSIS — R55 Syncope and collapse: Secondary | ICD-10-CM

## 2021-01-02 ENCOUNTER — Ambulatory Visit (INDEPENDENT_AMBULATORY_CARE_PROVIDER_SITE_OTHER): Payer: BC Managed Care – PPO | Admitting: Family Medicine

## 2021-01-02 ENCOUNTER — Other Ambulatory Visit: Payer: Self-pay

## 2021-01-02 ENCOUNTER — Encounter: Payer: Self-pay | Admitting: Family Medicine

## 2021-01-02 VITALS — BP 116/77 | HR 76 | Temp 99.4°F | Ht 64.0 in | Wt 154.4 lb

## 2021-01-02 DIAGNOSIS — R55 Syncope and collapse: Secondary | ICD-10-CM

## 2021-01-02 DIAGNOSIS — M25612 Stiffness of left shoulder, not elsewhere classified: Secondary | ICD-10-CM | POA: Diagnosis not present

## 2021-01-02 DIAGNOSIS — Z8616 Personal history of COVID-19: Secondary | ICD-10-CM | POA: Diagnosis not present

## 2021-01-02 DIAGNOSIS — H8112 Benign paroxysmal vertigo, left ear: Secondary | ICD-10-CM | POA: Diagnosis not present

## 2021-01-02 MED ORDER — MECLIZINE HCL 25 MG PO TABS: 25.0000 mg | ORAL_TABLET | Freq: Three times a day (TID) | ORAL | 1 refills | Status: DC | PRN

## 2021-01-02 NOTE — Progress Notes (Signed)
Subjective  CC:  Chief Complaint  Patient presents with   Transitions Of Care   Dizziness    2 weeks ago   Shoulder Pain    Left shoulder, hard to lift on going for a week.    HPI: Christine Nash is a 50 y.o. female who presents to [place] [facility] today to establish care with me as a new patient.   She has the following concerns or needs: Dizziness: onset around June 10th; close to same time as diagnosed with covid. Had acute vertigo and syncope. Evaluated in ER. I reviewed note. Thought related to vasovagal. Had f/u with cards who is evaluating for arrhythmia; wearing zio patch now. C/o persistent vertigo. Has used meclizine some with some relief. Has had some assoicated nausea w/o vomiting. Maybe movement related. Definitely improves with sitting still. Sxs worse in am. Gets better 'after' up and moving around. No sever headaches, diplopia or neuro deficits.  Can't raise her left arm x 4 days. Only painful if tries to lift above shoulder. No neck pain or radicular sxs. No h/o shoulder problems but fell on left side with syncope.   Assessment  1. Personal history of COVID-19   2. Benign paroxysmal positional vertigo of left ear   3. Syncope and collapse   4. Decreased range of motion of shoulder, left      Plan  H/o covid; mostly recovered.  Bppv: may be secondary to recent covid infection. No red flag sxs and exam c/w BPPV. Meclizine, PT and education  Possible rotator cuff tear after fall: refer to ortho.   Follow up:  feb for cpe Orders Placed This Encounter  Procedures   Ambulatory referral to Orthopedic Surgery   Ambulatory referral to Physical Therapy   Meds ordered this encounter  Medications   meclizine (ANTIVERT) 25 MG tablet    Sig: Take 1 tablet (25 mg total) by mouth 3 (three) times daily as needed for dizziness.    Dispense:  30 tablet    Refill:  1     Depression screen [facility] 2/9 08/10/2020 04/28/2020 03/09/2019 02/11/2018   Decreased Interest 0 0 0 1  Down, Depressed, Hopeless 0 0 0 1  PHQ - 2 Score 0 0 0 2  Altered sleeping - - - 0  Tired, decreased energy - - - 2  Change in appetite - - - 1  Feeling bad or failure about yourself  - - - 2  Trouble concentrating - - - 1  Moving slowly or fidgety/restless - - - 0  Suicidal thoughts - - - 0  PHQ-9 Score - - - 8  Difficult doing work/chores - - - Not difficult at all    We updated and reviewed the patient's past history in detail and it is documented below.  Patient Active Problem List   Diagnosis Date Noted   Cervicogenic headache 10/18/2019   Generalized anxiety disorder 02/11/2018   Hormone replacement therapy (HRT) 02/11/2018   Spinal stenosis of cervical region 02/11/2018    Severe, s/p 3 neck surgeries.      Familial hyperlipidemia 02/11/2018    dx'd 02/2018. ASCVD risk is only 1.1%. statin started 03/2019 due to increase in LDL to near 200.      Vitamin D deficiency 02/11/2018    Repeat labs in 06/2018      Attention deficit hyperactivity disorder, predominantly inattentive type 06/27/2011   Health Maintenance  Topic Date Due   Zoster Vaccines- Shingrix (  1 of 2) Never done   PAP SMEAR-Modifier  12/30/2020   INFLUENZA VACCINE  02/06/2021   MAMMOGRAM  11/26/2021   COLONOSCOPY (Pts 45-16yrs Insurance coverage will need to be confirmed)  12/31/2027   TETANUS/TDAP  02/12/2028   Hepatitis C Screening  Completed   HIV Screening  Completed   Pneumococcal Vaccine 69-75 Years old  Aged Out   HPV VACCINES  Aged Out   Immunization History  Administered Date(s) Administered   Tdap 02/11/2018   Current Meds  Medication Sig   acetaminophen (TYLENOL) 500 MG tablet Take 1,000 mg by mouth every 6 (six) hours as needed for moderate pain or headache.   B Complex-Biotin-FA (B-COMPLEX PO) Take 1 capsule by mouth daily.   baclofen (LIORESAL) 20 MG tablet Take 1 tablet (20 mg total) by mouth at bedtime.   cholecalciferol (VITAMIN D) 25 MCG (1000 UT)  tablet Take 4,000 Units by mouth daily.   Estradiol 10 MCG TABS vaginal tablet Place one tablet vaginally qhs x 1 week, then change to 3 x a week (Patient taking differently: Place 10 mcg vaginally 3 (three) times a week.)   ezetimibe (ZETIA) 10 MG tablet Take 1 tablet (10 mg total) by mouth daily.   ibuprofen (ADVIL) 200 MG tablet Take 800 mg by mouth every 6 (six) hours as needed for mild pain or headache.   meclizine (ANTIVERT) 25 MG tablet Take 1 tablet (25 mg total) by mouth 3 (three) times daily as needed for dizziness.    Allergies: Patient is allergic to meperidine hcl, prochlorperazine edisylate, propranolol, trokendi xr [topiramate er], and phentermine. Past Medical History Patient  has a past medical history of Anemia, Anxiety, Complication of anesthesia, Depression, Endometriosis, Fracture of right ulnar styloid, Hyperlipidemia, and PONV (postoperative nausea and vomiting). Past Surgical History Patient  has a past surgical history that includes Cervical spine surgery (2011,2012,2017); Cesarean section (3716,9678); Tubal ligation (2011); Cholecystectomy (N/A, 01/17/2019); Abdominal hysterectomy; Knee surgery; Bunionectomy; Shoulder surgery; Supracervical abdominal hysterectomy (2013); and Percutaneous pinning (Right, 08/18/2020). Family History: Patient family history includes Bladder Cancer in her father; Breast cancer in her maternal aunt; COPD in her mother; Diabetes in her maternal grandmother; Heart attack in her father; Melanoma in her father; Stroke in her mother. Social History:  Patient  reports that she has never smoked. She has never used smokeless tobacco. She reports previous alcohol use. She reports that she does not use drugs.  Review of Systems: Constitutional: negative for fever or malaise Ophthalmic: negative for photophobia, double vision or loss of vision Cardiovascular: negative for chest pain, dyspnea on exertion, or new LE swelling Respiratory: negative for SOB  or persistent cough Gastrointestinal: negative for abdominal pain, change in bowel habits or melena Genitourinary: negative for dysuria or gross hematuria Musculoskeletal: negative for new gait disturbance or muscular weakness Integumentary: negative for new or persistent rashes Neurological: negative for TIA or stroke symptoms Psychiatric: negative for SI or delusions Allergic/Immunologic: negative for hives  Patient Care Team    Relationship Specialty Notifications Start End  Leamon Arnt, MD PCP - General Family Medicine  01/02/21   Salvadore Dom, MD Consulting Physician Obstetrics and Gynecology  03/09/19   Pixie Casino, MD Consulting Physician Cardiology  01/02/21     Objective  Vitals: BP 116/77   Pulse 76   Temp 99.4 F (37.4 C) (Temporal)   Ht 5\' 4"  (1.626 m)   Wt 154 lb 6.4 oz (70 kg)   SpO2 99%   BMI 26.50 kg/m  General:  Well developed, well nourished, no acute distress  Psych:  Alert and oriented,normal mood and affect HEENT:  Normocephalic, atraumatic, non-icteric sclera, supple neck without adenopathy, mass or thyromegaly, nl TMs Cardiovascular:  RRR without gallop, rub or murmur Respiratory:  Good breath sounds bilaterally, CTAB with normal respiratory effort Left shoulder: Pain with lifting shoulder past 90 degress; positive drop test. Decreased PROM.  Neurologic:    Mental status is normal. Gross motor and sensory exams are normal. Normal gait + hall pike on left.  Commons side effects, risks, benefits, and alternatives for medications and treatment plan prescribed today were discussed, and the patient expressed understanding of the given instructions. Patient is instructed to call or message via MyChart if he/she has any questions or concerns regarding our treatment plan. No barriers to understanding were identified. We discussed Red Flag symptoms and signs in detail. Patient expressed understanding regarding what to do in case of urgent or emergency  type symptoms.  Medication list was reconciled, printed and provided to the patient in AVS. Patient instructions and summary information was reviewed with the patient as documented in the AVS. This note was prepared with assistance of Dragon voice recognition software. Occasional wrong-word or sound-a-like substitutions may have occurred due to the inherent limitations of voice recognition software  This visit occurred during the SARS-CoV-2 public health emergency.  Safety protocols were in place, including screening questions prior to the visit, additional usage of staff PPE, and extensive cleaning of exam room while observing appropriate contact time as indicated for disinfecting solutions.

## 2021-01-02 NOTE — Patient Instructions (Signed)
Please return in February for your annual complete physical; please come fasting.   We will call you with information regarding your referral appointment. Physical therapy for your vertigo and Dr. Lorre Nick for your shoulder.  If you do not hear from Christine Nash within the next 2 weeks, please let me know. It can take 1-2 weeks to get appointments set up with the specialists.    If you have any questions or concerns, please don't hesitate to send me a message via MyChart or call the office at [phone]. Thank you for visiting with Christine Nash today! It's our pleasure caring for you.   Rotator Cuff Tear  A rotator cuff tear is a partial or complete tear of the cord-like bands (tendons) that connect muscle to bone in the rotator cuff. The rotator cuff is a group of muscles and tendons that surround the shoulder joint and keep the upper arm bone (humerus) in the shoulder socket. The tear can occur suddenly (acute tear) or can develop over a long period of time (chronic tear). What are the causes? Acute tears may be caused by: A fall, especially on an outstretched arm. Lifting very heavy objects with a jerking motion. Chronic tears may be caused by overuse of the muscles. This may happen in sports, physical work, or activities in which your arm repeatedly moves overyour head. What increases the risk? This condition is more likely to occur in: Athletes and workers who frequently use their shoulder or reach over their head. This may include activities such as: Tennis. Baseball and softball. Swimming and rowing. Weight lifting. Construction work. Painting. People who smoke. Older people who have arthritis or poor blood supply. These can make the muscles and tendons weaker. What are the signs or symptoms? Symptoms of this condition depend on the type and severity of the injury: An acute tear may include a sudden tearing feeling, followed by severe pain that goes from your upper shoulder, down your arm, and toward  your elbow. A chronic tear includes a gradual weakness and decreased shoulder motion as the pain gets worse. The pain is usually worse at night. Both types may have symptoms such as: Pain that spreads (radiates) from the shoulder to the upper arm. Swelling and tenderness in front of the shoulder. Decreased range of motion. Pain when: Reaching, pulling, or lifting the arm above the head. Lowering the arm from above the head. Not being able to raise your arm out to the side. Difficulty placing the arm behind your back. How is this diagnosed? This condition is diagnosed with a medical history and physical exam. Imaging tests may also be done, including: X-rays. MRI. Ultrasound. CT or MR arthrogram. During this test, a contrast material is injected into your shoulder, and then images are taken. How is this treated? Treatment for this condition depends on the type and severity of the condition. In less severe cases, treatment may include: Rest. This may be done with a sling that holds the shoulder still (immobilization). Your health care provider may also recommend avoiding activities that involve lifting your arm over your head. Icing the shoulder. Anti-inflammatory medicines, such as aspirin or ibuprofen. Strengthening and stretching exercises. Your health care provider may recommend specific exercises to improve your range of motion and strengthen your shoulder. In more severe cases, treatment may include: Physical therapy. Steroid injections. Surgery. Follow these instructions at home: Managing pain, stiffness, and swelling     If directed, put ice on the injured area. To do this: If you have  a removable sling, remove it as told by your health care provider. Put ice in a plastic bag. Place a towel between your skin and the bag. Leave the ice on for 20 minutes, 2-3 times a day. Remove the ice if your skin turns bright red. This is very important. If you cannot feel pain, heat, or  cold, you have a greater risk of damage to the area. Raise (elevate) the injured area above the level of your heart while you are lying down. Find a comfortable sleeping position or sleep on a recliner, if available. Move your fingers often to reduce stiffness and swelling. Once the swelling has gone down, your health care provider may direct you to apply heat to relax the muscles. Use the heat source that your health care provider recommends, such as a moist heat pack or a heating pad. Place a towel between your skin and the heat source. Leave the heat on for 20-30 minutes. Remove the heat if your skin turns bright red. This is especially important if you are unable to feel pain, heat, or cold. You have a greater risk of getting burned. If you have a sling: Wear the sling as told by your health care provider. Remove it only as told by your health care provider. Loosen the sling if your fingers tingle, become numb, or turn cold and blue. Keep the sling clean. If the sling is not waterproof: Do not let it get wet. Cover it with a watertight covering when you take a bath or a shower. Activity Rest your shoulder as told by your health care provider. Return to your normal activities as told by your health care provider. Ask your health care provider what activities are safe for you. Ask your health care provider when it is safe to drive if you have a sling on your arm. Do any exercises or stretches as told by your health care provider. General instructions Take over-the-counter and prescription medicines only as told by your health care provider. Do not use any products that contain nicotine or tobacco, such as cigarettes, e-cigarettes, and chewing tobacco. If you need help quitting, ask your health care provider. Keep all follow-up visits. This is important. Contact a health care provider if: Your pain gets worse. You have new pain in your arm, hands, or fingers. Medicine does not help your  pain. Get help right away if: Your arm, hand, or fingers are numb or tingling. Your arm, hand, or fingers are swollen or painful or they turn white or blue. Your hand or fingers on your injured arm are colder than your other hand. Summary A rotator cuff tear is a partial or complete tear of the cord-like bands (tendons) that connect muscle to bone in the rotator cuff. The tear can occur suddenly (acute tear) or can develop over a long period of time (chronic tear). Treatment generally includes rest, anti-inflammatory medicines, and icing. In some cases, physical therapy and steroid injections may be needed. In severe cases, surgery may be needed. This information is not intended to replace advice given to you by your health care provider. Make sure you discuss any questions you have with your healthcare provider. Document Revised: 10/28/2019 Document Reviewed: 10/28/2019 Elsevier Patient Education  2022 [employer].

## 2021-01-05 ENCOUNTER — Telehealth: Payer: Self-pay | Admitting: Internal Medicine

## 2021-01-05 DIAGNOSIS — M19012 Primary osteoarthritis, left shoulder: Secondary | ICD-10-CM | POA: Diagnosis not present

## 2021-01-05 DIAGNOSIS — M25512 Pain in left shoulder: Secondary | ICD-10-CM | POA: Diagnosis not present

## 2021-01-05 DIAGNOSIS — Z9181 History of falling: Secondary | ICD-10-CM | POA: Diagnosis not present

## 2021-01-05 NOTE — Telephone Encounter (Signed)
Patient has the ZO patch received on the 25t but patient needs an MRI on left shoulder. Wanted to see if the monitored can be remove before the two weeks are up. So that she can get the MRI done. Please advise

## 2021-01-05 NOTE — Telephone Encounter (Signed)
Spoke with patient who was ordered a monitor for palpitations/presyncope. She reports she needs an MRI for her shoulder - in a lot of pain, may need surgery. This test is not yet scheduled but she would need to stop her ZIO monitor. She will have worn her monitor for 1 week on Saturday - she reports she has had palpitations, dizziness, presyncope while wearing monitor and has recorded in log. Advised she should ideally wear monitor as long as possible but if she needs to remove sooner, contact [place] about stopping it/returning monitor  Will made MD aware

## 2021-01-06 ENCOUNTER — Telehealth: Payer: Self-pay | Admitting: Internal Medicine

## 2021-01-06 NOTE — Telephone Encounter (Signed)
Patient c/o Palpitations:  High priority if patient c/o lightheadedness, shortness of breath, or chest pain  How long have you had palpitations/irregular HR/ Afib? Are you having the symptoms now? 1 1/2hours  Are you currently experiencing lightheadedness, SOB or CP? No  Do you have a history of afib (atrial fibrillation) or irregular heart rhythm? No not that she knows of  Have you checked your BP or HR? (document readings if available): No pt does not have a monitor. Pt was advised to get a  BP monitor but she have not gotten around to it.  Are you experiencing any other symptoms? Dizziness  Pt has been wearing a ZIO patch for about a week

## 2021-01-06 NOTE — Telephone Encounter (Signed)
Ok .. will try to correlate with monitor findings when it results  Dr Lemmie Evens

## 2021-01-06 NOTE — Telephone Encounter (Signed)
Spoke with patient of Dr. Debara Pickett who reports about 2 hours of palpitations, "heart beating crazy", dizziness, felt like she wcould pass out. This occurred with no identifiable trigger, she was sitting down. She is better now, no symptoms. She reports her HR was max 85bpm during this and BP was 106/83. She has palpated manual pulse which she reports will beat normal, pause, weak beats and then speed up for a few beats. She recorded this with her zio monitor. Advised will send to MD as FYI as we are unable to access zio monitor at this time. She may need to take off early for an MRI on her shoulder, but this is not yet scheduled.

## 2021-01-10 ENCOUNTER — Other Ambulatory Visit: Payer: Self-pay | Admitting: Orthopedic Surgery

## 2021-01-10 DIAGNOSIS — M25512 Pain in left shoulder: Secondary | ICD-10-CM

## 2021-01-11 ENCOUNTER — Ambulatory Visit: Payer: BC Managed Care – PPO | Attending: Family Medicine

## 2021-01-11 ENCOUNTER — Other Ambulatory Visit: Payer: Self-pay

## 2021-01-11 DIAGNOSIS — R42 Dizziness and giddiness: Secondary | ICD-10-CM | POA: Insufficient documentation

## 2021-01-11 NOTE — Therapy (Signed)
[facility] [address] [place], [place], [address] Phone: [phone]   Fax:  [phone]  Physical Therapy Evaluation  Patient Details  Name: Christine Nash MRN: [id number] Date of Birth: [birth year] Referring Provider (PT): Billey Chang, MD   Encounter Date: 01/11/2021   PT End of Session - 01/11/21 0848     Visit Number 1    Number of Visits 5    Date for PT Re-Evaluation 02/10/21    Authorization Type BCBS (VL: 30)    PT Start Time 0846    PT Stop Time 0929    PT Time Calculation (min) 43 min    Activity Tolerance Patient tolerated treatment well    Behavior During Therapy [facility] for tasks assessed/performed             Past Medical History:  Diagnosis Date   Anemia    Anxiety    Complication of anesthesia    Depression    Endometriosis    Fracture of right ulnar styloid    Hyperlipidemia    PONV (postoperative nausea and vomiting)     Past Surgical History:  Procedure Laterality Date   ABDOMINAL HYSTERECTOMY     BUNIONECTOMY     2008   CERVICAL SPINE SURGERY  2011,2012,2017   ACDF repair w/instrumentation   CESAREAN SECTION  [id number],[id number]   CHOLECYSTECTOMY N/A 01/17/2019   Procedure: LAPAROSCOPIC CHOLECYSTECTOMY WITH INTRAOPERATIVE CHOLANGIOGRAM;  Surgeon: Alphonsa Overall, MD;  Location: [facility];  Service: General;  Laterality: N/A;   KNEE SURGERY     left knee   PERCUTANEOUS PINNING Right 08/18/2020   Procedure: Closed reduction and percutaneous pinning  of right ulnar fracture;  Surgeon: Cindra Presume, MD;  Location: [facility];  Service: Plastics;  Laterality: Right;  60 min   [place]   SUPRACERVICAL ABDOMINAL HYSTERECTOMY  2013   TUBAL LIGATION  2011    There were no vitals filed for this visit.    Subjective Assessment - 01/11/21 0849     Subjective Patient reports on June 10th reports she blacked out and had a fall in the bathroom, does not recall  the events. Patient went to ER and they believed fall was due to syncope. Reports a couple days later she was laying in bed, and had severe spinning sensation. Reports the vertigo continues to come and go, has gradually. Reports she has had heart palpitations and is currently wearing a heart monitor. Patient also potential rotator cuff tear due to the fall as well.    Pertinent History Anemia, Anxiety, Depression, Fracture of R Ulnar Styloid, HLD    Currently in Pain? Yes    Pain Score 4     Pain Location Head    Pain Orientation Posterior    Pain Descriptors / Indicators Headache    Pain Type Acute pain    Pain Onset Today    Pain Frequency Several days a week    Aggravating Factors  not sure    Pain Relieving Factors ibuprofen                [facility] PT Assessment - 01/11/21 0001       Assessment   Medical Diagnosis BPPV/Vertigo    Referring Provider (PT) Billey Chang, MD    Onset Date/Surgical Date 12/16/20    Prior Therapy None      Precautions   Precautions Other (comment)   Currently wearing Heart Monitor  Balance Screen   Has the patient fallen in the past 6 months Yes    How many times? 2    Has the patient had a decrease in activity level because of a fear of falling?  No    Is the patient reluctant to leave their home because of a fear of falling?  No      Home Ec[employer] residence    Living Arrangements Spouse/significant other    Available Help at Discharge Family    Type of [place]      Prior Function   Level of Independence Independent      Cognition   Overall Cognitive Status Within Functional Limits for tasks assessed      Observation/Other Assessments   Focus on Therapeutic Outcomes (FOTO)  DPS: 47 , DFS: 41.7      Sensation   Light Touch Appears Intact      Coordination   Gross Motor Movements are Fluid and Coordinated Yes      ROM / Strength   AROM / PROM / Strength Strength      Strength   Overall  Strength Within functional limits for tasks performed      Transfers   Transfers Stand to Sit;Sit to Stand    Sit to Stand 7: Independent    Stand to Sit 7: Independent      Ambulation/Gait   Ambulation/Gait Yes    Ambulation/Gait Assistance 7: Independent    Assistive device None    Gait Pattern Within Functional Limits    Ambulation Surface Level;Indoor                    Vestibular Assessment - 01/11/21 0001       Symptom Behavior   Subjective history of current problem see subjective.    Type of Dizziness  Imbalance;Spinning;Lightheadedness;Vertigo    Frequency of Dizziness 3-4x/week    Duration of Dizziness seconds to minutes    Symptom Nature Motion provoked;Positional;Intermittent    Aggravating Factors Looking up to the ceiling;Lying supine;Turning head quickly;Forward bending    Relieving Factors Rest;Slow movements    Progression of Symptoms Better    History of similar episodes None      Oculomotor Exam   Oculomotor Alignment Normal    Ocular ROM WNL    Spontaneous Absent    Gaze-induced  Absent   dizziness with R gaze   Smooth Pursuits Intact    Saccades Intact   reports spinning with downward gaze     Oculomotor Exam-Fixation Suppressed    Left Head Impulse Negative    Right Head Impulse Negative      Vestibulo-Ocular Reflex   VOR 1 Head Only (x 1 viewing) Normal      Positional Testing   Dix-Hallpike Dix-Hallpike Right;Dix-Hallpike Left    Sidelying Test Sidelying Right   unable to test L due to pain and potential rotator cuff tear   Horizontal Canal Testing Horizontal Canal Right;Horizontal Canal Left      Dix-Hallpike Right   Dix-Hallpike Right Duration 0    Dix-Hallpike Right Symptoms No nystagmus      Dix-Hallpike Left   Dix-Hallpike Left Duration 0    Dix-Hallpike Left Symptoms No nystagmus      Sidelying Right   Sidelying Right Duration 0    Sidelying Right Symptoms No nystagmus      Horizontal Canal Right   Horizontal Canal  Right Duration 0    Horizontal Canal Right  Symptoms Normal      Horizontal Canal Left   Horizontal Canal Left Duration 0    Horizontal Canal Left Symptoms Normal      Positional Sensitivities   Sit to Supine No dizziness    Supine to Left Side --   unable to complete due to L shoulder injury   Supine to Right Side Mild dizziness    Supine to Sitting Lightheadedness    Right Hallpike No dizziness    Up from Right Hallpike Lightheadedness    Up from Left Hallpike Mild dizziness    Nose to Right Knee No dizziness    Right Knee to Sitting Mild dizziness    Nose to Left Knee No dizziness    Left Knee to Sitting Mild dizziness    Head Turning x 5 Moderate dizziness   reports spinning sensation   Head Nodding x 5 Mild dizziness    Rolling Right Mild dizziness    Rolling Left --   unable to complete due to L shoulder injury               Objective measurements completed on examination: See above findings.               PT Education - 01/11/21 0853     Education Details Educated on POC/Evaluation Findings    Person(s) Educated Patient    Methods Explanation    Comprehension Verbalized understanding              PT Short Term Goals - 01/11/21 1158       PT SHORT TERM GOAL #1   Title = LTG               PT Long Term Goals - 01/11/21 1158       PT LONG TERM GOAL #1   Title Patient will be independent with initial Vestibular HEP    Baseline no HEP established    Time 4    Period Weeks    Status New    Target Date 02/10/21      PT LONG TERM GOAL #2   Title Patient will continue to demo (-) positional testing to indicate resolution of BPPV    Baseline subjective vertigo    Time 4    Period Weeks    Status New      PT LONG TERM GOAL #3   Title Patient report </= 1 dizziness with all components of MSQ for improved tolerance for functional activities    Baseline see flowhseet    Time 4    Period Weeks    Status New      PT LONG TERM GOAL  #4   Title Patient will improve DPS >/= 55, and DFS >/= 48    Baseline DPS: 47, DFS: 41.7    Time 4    Period Weeks    Status New                    Plan - 01/11/21 1202     Clinical Impression Statement Patient is a 50 y.o. female referred to neuro OPPT services for Vertigo/BPPV. Patient's PMH significant for the following: Anemia, Anxiety, Depression, Fracture of R Ulnar Styloid, HLD. Patient presents with the following impairments: dizziness, decreased activity tolerance, subjective vertigo with positoinal testing (no nystagmus noted on eval), and motion sensitivity. Unable to complete test that require patient lie on L side due to pain/potential rotator cuff tear. Will benefit from further assesment with  frenzel to reduce fixation and allow for further assesment of BPPV. Patient will benefit from skilled PT services to address impairments and improved activity tolerance.    Personal Factors and Comorbidities Comorbidity 3+    Comorbidities Anemia, Anxiety, Depression, Fracture of R Ulnar Styloid, HLD    Examination-Activity Limitations Bend;Bed Mobility;Lift;Reach Overhead    Examination-Participation Restrictions Community Activity;Cleaning;Meal Prep;Yard Work    Stability/Clinical Decision Making Stable/Uncomplicated    Clinical Decision Making Low    Rehab Potential Good    PT Frequency 1x / week    PT Duration 4 weeks    PT Treatment/Interventions ADLs/Self Care Home Management;Canalith Repostioning;Cryotherapy;Electrical Stimulation;Moist Heat;Stair training;Functional mobility training;Therapeutic activities;Balance training;Therapeutic exercise;Gait training;Neuromuscular re-education;Patient/family education;Manual techniques;Passive range of motion;Dry needling;Vestibular    PT Next Visit Plan Reassess BPPV with Frenzel Googles, treat as indicated. initiate habituation HEP    Consulted and Agree with Plan of Care Patient             Patient will benefit from  skilled therapeutic intervention in order to improve the following deficits and impairments:  Dizziness, Decreased activity tolerance, Pain  Visit Diagnosis: Dizziness and giddiness     Problem List Patient Active Problem List   Diagnosis Date Noted   Cervicogenic headache 10/18/2019   Generalized anxiety disorder 02/11/2018   Hormone replacement therapy (HRT) 02/11/2018   Spinal stenosis of cervical region 02/11/2018   Familial hyperlipidemia 02/11/2018   Vitamin D deficiency 02/11/2018   Attention deficit hyperactivity disorder, predominantly inattentive type 06/27/2011    Jones Bales, PT, DPT 01/11/2021, 12:07 PM  [facility] [address] [place], [place], [address] Phone: [phone]   Fax:  [phone]  Name: Christine Nash MRN: [id number] Date of Birth: 10/04/1970

## 2021-01-16 ENCOUNTER — Ambulatory Visit
Admission: RE | Admit: 2021-01-16 | Discharge: 2021-01-16 | Disposition: A | Discharge status: Home / Self Care | Payer: BC Managed Care – PPO | Source: Ambulatory Visit | Attending: Orthopedic Surgery | Admitting: Orthopedic Surgery

## 2021-01-16 ENCOUNTER — Other Ambulatory Visit: Payer: Self-pay

## 2021-01-16 DIAGNOSIS — M25512 Pain in left shoulder: Secondary | ICD-10-CM

## 2021-01-16 DIAGNOSIS — S42142A Displaced fracture of glenoid cavity of scapula, left shoulder, initial encounter for closed fracture: Secondary | ICD-10-CM | POA: Diagnosis not present

## 2021-01-19 ENCOUNTER — Ambulatory Visit: Payer: BC Managed Care – PPO

## 2021-01-19 ENCOUNTER — Other Ambulatory Visit: Payer: Self-pay

## 2021-01-19 DIAGNOSIS — R55 Syncope and collapse: Secondary | ICD-10-CM | POA: Diagnosis not present

## 2021-01-19 DIAGNOSIS — R42 Dizziness and giddiness: Secondary | ICD-10-CM

## 2021-01-19 DIAGNOSIS — R002 Palpitations: Secondary | ICD-10-CM | POA: Diagnosis not present

## 2021-01-19 NOTE — Patient Instructions (Signed)
Access Code: [email or web address] URL: [email or web address] Date: 01/19/2021 Prepared by: Baldomero Lamy  Exercises Brandt-Daroff Vestibular Exercise - 1 x daily - 5 x weekly - 1 sets - 5 reps Seated Head Turns Vestibular Habituation - 1 x daily - 5 x weekly - 2 sets - 5 reps Seated Head Nods Vestibular Habituation - 1 x daily - 5 x weekly - 2 sets - 5 reps Seated Nose to Knee Vestibular Habituation - 1 x daily - 7 x weekly - 2 sets - 5 reps

## 2021-01-19 NOTE — Therapy (Addendum)
[facility] [address] [place], [place], [address] Phone: [phone]   Fax:  [phone]  Physical Therapy Treatment  Patient Details  Name: Christine Nash MRN: [id number] Date of Birth: 11/30/1970 Referring Provider (PT): Billey Chang, MD   Encounter Date: 01/19/2021   PT End of Session - 01/19/21 1020     Visit Number 2    Number of Visits 5    Date for PT Re-Evaluation 02/10/21    Authorization Type BCBS (VL: 30)    PT Start Time 1017    PT Stop Time 1058    PT Time Calculation (min) 41 min    Activity Tolerance Patient tolerated treatment well    Behavior During Therapy [facility] for tasks assessed/performed             Past Medical History:  Diagnosis Date   Anemia    Anxiety    Complication of anesthesia    Depression    Endometriosis    Fracture of right ulnar styloid    Hyperlipidemia    PONV (postoperative nausea and vomiting)     Past Surgical History:  Procedure Laterality Date   ABDOMINAL HYSTERECTOMY     BUNIONECTOMY     2008   CERVICAL SPINE SURGERY  2011,2012,2017   ACDF repair w/instrumentation   CESAREAN SECTION  1275,1700   CHOLECYSTECTOMY N/A 01/17/2019   Procedure: LAPAROSCOPIC CHOLECYSTECTOMY WITH INTRAOPERATIVE CHOLANGIOGRAM;  Surgeon: Alphonsa Overall, MD;  Location: [facility];  Service: General;  Laterality: N/A;   KNEE SURGERY     left knee   PERCUTANEOUS PINNING Right 08/18/2020   Procedure: Closed reduction and percutaneous pinning  of right ulnar fracture;  Surgeon: Cindra Presume, MD;  Location: [facility];  Service: Plastics;  Laterality: Right;  60 min   [place]   SUPRACERVICAL ABDOMINAL HYSTERECTOMY  2013   TUBAL LIGATION  2011    There were no vitals filed for this visit.   Subjective Assessment - 01/19/21 1021     Subjective Patient reports she did have some mild symptoms after that initial visit. Patient reports Monday and  Tuesday she did have some dizziness, where she felt as if she was swaying. No episodes of syncope reported. MRI showed no rotator cuff tear, but did have a fracture of the glenoid. Has not recieved results for the heart monitor.    Pertinent History Anemia, Anxiety, Depression, Fracture of R Ulnar Styloid, HLD    Currently in Pain? No/denies    Pain Onset --                     Vestibular Assessment - 01/19/21 0001       Positional Testing   Dix-Hallpike Dix-Hallpike Right;Dix-Hallpike Left   completed with Frenzel Goggles   Horizontal Canal Testing Horizontal Canal Right;Horizontal Canal Left   Completed with Frenzel Goggles     Dix-Hallpike Right   Dix-Hallpike Right Duration 0    Dix-Hallpike Right Symptoms No nystagmus      Dix-Hallpike Left   Dix-Hallpike Left Duration 0    Dix-Hallpike Left Symptoms No nystagmus   reports mild subejctive spinning sensation     Horizontal Canal Right   Horizontal Canal Right Duration 0    Horizontal Canal Right Symptoms Normal      Horizontal Canal Left   Horizontal Canal Left Duration 0    Horizontal Canal Left Symptoms Normal  Vestibular Treatment/Exercise - 01/19/21 0001       Vestibular Treatment/Exercise   Habituation Exercises Seated Vertical Head Turns;Seated Horizontal Head Turns;Brandt Daroff      Brandt Daroff   Number of Reps  2    Symptom Description  unable to complete to L due to shoulder fx, only complete to R side at this time.      Seated Horizontal Head Turns   Number of Reps  5    Symptom Description  mild - mod symptoms      Seated Vertical Head Turns   Number of Reps  5    Symptom Description  mild - mod symptoms.            Initiated Vestibular HEP, completed all of the following exercises during session. Provided patient with handout:   Access Code: [email or web address] URL: [email or web address] Date: 01/19/2021 Prepared by: Baldomero Lamy    Exercises Brandt-Daroff Vestibular Exercise - 1 x daily - 5 x weekly - 1 sets - 5 reps Seated Head Turns Vestibular Habituation - 1 x daily - 5 x weekly - 2 sets - 5 reps Seated Head Nods Vestibular Habituation - 1 x daily - 5 x weekly - 2 sets - 5 reps Seated Nose to Knee Vestibular Habituation - 1 x daily - 7 x weekly - 2 sets - 5 reps       PT Education - 01/19/21 1104     Education Details No Signs of BPPV; Initial HEP    Person(s) Educated Patient    Methods Explanation;Demonstration;Handout    Comprehension Returned demonstration;Verbalized understanding              PT Short Term Goals - 01/11/21 1158       PT SHORT TERM GOAL #1   Title = LTG               PT Long Term Goals - 01/11/21 1158       PT LONG TERM GOAL #1   Title Patient will be independent with initial Vestibular HEP    Baseline no HEP established    Time 4    Period Weeks    Status New    Target Date 02/10/21      PT LONG TERM GOAL #2   Title Patient will continue to demo (-) positional testing to indicate resolution of BPPV    Baseline subjective vertigo    Time 4    Period Weeks    Status New      PT LONG TERM GOAL #3   Title Patient report </= 1 dizziness with all components of MSQ for improved tolerance for functional activities    Baseline see flowhseet    Time 4    Period Weeks    Status New      PT LONG TERM GOAL #4   Title Patient will improve DPS >/= 55, and DFS >/= 48    Baseline DPS: 47, DFS: 41.7    Time 4    Period Weeks    Status New                   Plan - 01/19/21 1105     Clinical Impression Statement Today's skilled PT session foucsed on reassesment for BPPV with use of Frenzel goggles to reduce fixation. Patient continue to report mild spinning with L Marye Round but no nystagmus noted. Initiated Vestibular HEP focused on habituation to head turns/nods and vertical movements. Patient limited with ability to  complete habituation on L Side due  to L Glenoid Fx. Will continue to progress as tolerated.    Personal Factors and Comorbidities Comorbidity 3+    Comorbidities Anemia, Anxiety, Depression, Fracture of R Ulnar Styloid, HLD    Examination-Activity Limitations Bend;Bed Mobility;Lift;Reach Overhead    Examination-Participation Restrictions Community Activity;Cleaning;Meal Prep;Yard Work    Stability/Clinical Decision Making Stable/Uncomplicated    Rehab Potential Good    PT Frequency 1x / week    PT Duration 4 weeks    PT Treatment/Interventions ADLs/Self Care Home Management;Canalith Repostioning;Cryotherapy;Electrical Stimulation;Moist Heat;Stair training;Functional mobility training;Therapeutic activities;Balance training;Therapeutic exercise;Gait training;Neuromuscular re-education;Patient/family education;Manual techniques;Passive range of motion;Dry needling;Vestibular    PT Next Visit Plan How was HEP?    Consulted and Agree with Plan of Care Patient             Patient will benefit from skilled therapeutic intervention in order to improve the following deficits and impairments:  Dizziness, Decreased activity tolerance, Pain  Visit Diagnosis: Dizziness and giddiness     Problem List Patient Active Problem List   Diagnosis Date Noted   Cervicogenic headache 10/18/2019   Generalized anxiety disorder 02/11/2018   Hormone replacement therapy (HRT) 02/11/2018   Spinal stenosis of cervical region 02/11/2018   Familial hyperlipidemia 02/11/2018   Vitamin D deficiency 02/11/2018   Attention deficit hyperactivity disorder, predominantly inattentive type 06/27/2011    Jones Bales, PT, DPT 01/19/2021, 11:08 AM  [facility] [address] [place], [place], [address] Phone: [phone]   Fax:  [phone]  Name: Janeann Paisley MRN: [id number] Date of Birth: 1971/03/20  PHYSICAL THERAPY DISCHARGE SUMMARY  Visits from Start of Care:  2   Plan: Patient agrees to discharge.  Patient goals were partially met. Patient is being discharged due to - not returning since last visit.       Lyndee Hensen, PT, DPT 5:00 PM  04/09/22

## 2021-01-23 DIAGNOSIS — M7582 Other shoulder lesions, left shoulder: Secondary | ICD-10-CM | POA: Diagnosis not present

## 2021-01-23 DIAGNOSIS — S42142A Displaced fracture of glenoid cavity of scapula, left shoulder, initial encounter for closed fracture: Secondary | ICD-10-CM | POA: Diagnosis not present

## 2021-01-27 ENCOUNTER — Encounter: Payer: BC Managed Care – PPO | Admitting: Physical Therapy

## 2021-02-02 ENCOUNTER — Telehealth: Payer: Self-pay

## 2021-02-02 MED ORDER — ESTRADIOL 0.1 MG/GM VA CREA: TOPICAL_CREAM | VAGINAL | 0 refills | Status: DC

## 2021-02-02 NOTE — Telephone Encounter (Signed)
Spoke with patient and informed her. °

## 2021-02-02 NOTE — Telephone Encounter (Signed)
Please let the patient know that I called in some estrace cream for her.

## 2021-02-02 NOTE — Telephone Encounter (Signed)
Patient called because on accident her refill on Estradiol tabs was left in the car's trunk yesterday in the very hot temps. SHe called the company about this and they said not safe to use it after it has been exposed to the high temps.  Patient called asking about getting something else until her next refill. She states it is too expensive for her to pay out of pocket for it.  I suggested that she call her insurance company first and see if that have any kind of built in protection on her plan for this type situation. Sometimes ins companies offer a time or two of forgiveness and will issue an override and she can get another refill.  She is going to check and will call me if that is not the case.

## 2021-02-02 NOTE — Telephone Encounter (Signed)
Patient called back after speaking with ins co. They would not help her with this.  Patient said she cannot be without the Vagifem for 3 mos and is asking if there is something else you could prescribe in the interim that her ins will pay for.

## 2021-02-20 DIAGNOSIS — M25531 Pain in right wrist: Secondary | ICD-10-CM | POA: Diagnosis not present

## 2021-02-20 DIAGNOSIS — M25541 Pain in joints of right hand: Secondary | ICD-10-CM | POA: Diagnosis not present

## 2021-02-20 DIAGNOSIS — M25611 Stiffness of right shoulder, not elsewhere classified: Secondary | ICD-10-CM | POA: Diagnosis not present

## 2021-02-20 DIAGNOSIS — M25511 Pain in right shoulder: Secondary | ICD-10-CM | POA: Diagnosis not present

## 2021-02-20 DIAGNOSIS — S42145D Nondisplaced fracture of glenoid cavity of scapula, left shoulder, subsequent encounter for fracture with routine healing: Secondary | ICD-10-CM | POA: Diagnosis not present

## 2021-02-22 ENCOUNTER — Encounter: Payer: Self-pay | Admitting: Family Medicine

## 2021-02-23 ENCOUNTER — Telehealth: Payer: Self-pay | Admitting: *Deleted

## 2021-02-23 ENCOUNTER — Other Ambulatory Visit: Payer: Self-pay

## 2021-02-23 MED ORDER — BUSPIRONE HCL 7.5 MG PO TABS: 7.5000 mg | ORAL_TABLET | ORAL | 1 refills | Status: DC | PRN

## 2021-02-23 NOTE — Telephone Encounter (Signed)
Patient called stating she has been using estradiol cream 1 gram twice weekly for about 3 weeks now and has not noticed any change in symptoms. Sex still very painful. She asked if increase on dosage (2 gram) twice weekly or if frequency should be increased? Please advise

## 2021-02-23 NOTE — Telephone Encounter (Signed)
She should continue with 1 gram 2 x a week. I see that she has her annual exam next week. I can give her more advice after doing an exam. She may need vaginal dilators.

## 2021-02-24 DIAGNOSIS — M25511 Pain in right shoulder: Secondary | ICD-10-CM | POA: Diagnosis not present

## 2021-02-24 DIAGNOSIS — M25541 Pain in joints of right hand: Secondary | ICD-10-CM | POA: Diagnosis not present

## 2021-02-24 DIAGNOSIS — M25611 Stiffness of right shoulder, not elsewhere classified: Secondary | ICD-10-CM | POA: Diagnosis not present

## 2021-02-24 DIAGNOSIS — M25531 Pain in right wrist: Secondary | ICD-10-CM | POA: Diagnosis not present

## 2021-02-24 NOTE — Telephone Encounter (Signed)
Spoke with patient she is aware of recommendations

## 2021-02-24 NOTE — Telephone Encounter (Signed)
Left message for patient to call.

## 2021-02-27 DIAGNOSIS — M25531 Pain in right wrist: Secondary | ICD-10-CM | POA: Diagnosis not present

## 2021-02-27 DIAGNOSIS — M25611 Stiffness of right shoulder, not elsewhere classified: Secondary | ICD-10-CM | POA: Diagnosis not present

## 2021-02-27 DIAGNOSIS — M25541 Pain in joints of right hand: Secondary | ICD-10-CM | POA: Diagnosis not present

## 2021-02-27 DIAGNOSIS — M25511 Pain in right shoulder: Secondary | ICD-10-CM | POA: Diagnosis not present

## 2021-03-01 NOTE — Progress Notes (Signed)
50 y.o. G3P2103 Married White or Caucasian Not Hispanic or Latino female here for annual exam. H/O supracervical hysterectomy. No vaginal bleeding.   No bowel or bladder c/o.   She was started on vagifem in 5/22 for atrophy and dyspareunia. She lost her pills and a one time tube of estrace was called in for her last month.  Patient states that she doesn't feel like the estradiol  cream isn't working for her. She has had trouble with putting the applicator in. She states that she has a lot of pain with sex. She is using uberlube for lubrication. She felt sex was less painful when she was using the vaginal estrogen tablets.    She got covid in June. She fell from a standing position and fractured her glenoid. Then developed vertigo.     No LMP recorded. Patient has had a hysterectomy.          Sexually active: Yes.    The current method of family planning is status post hysterectomy.    Exercising: Yes.     Walking  Smoker:  no  Health Maintenance: Pap:  10/24/2017 WNL, no hpv History of abnormal Pap:  no MMG:  11/16/20 Bi-rads 1 neg  BMD:   none  Colonoscopy: It's in the chart that she had a colonoscopy in 2019, but she feels that is an error. Feels she is due.    TDaP:  02/11/18  Gardasil: n/a   reports that she has never smoked. She has never used smokeless tobacco. She reports that she does not currently use alcohol. She reports that she does not use drugs. 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 2 grown sons, daughter is 48, middle child has Asperger's  Past Medical History:  Diagnosis Date   Anemia    Anxiety    Complication of anesthesia    Depression    Endometriosis    Fracture of right ulnar styloid    Hyperlipidemia    PONV (postoperative nausea and vomiting)     Past Surgical History:  Procedure Laterality Date   ABDOMINAL HYSTERECTOMY     BUNIONECTOMY     2008   CERVICAL SPINE SURGERY  2011,2012,2017   ACDF repair w/instrumentation   CESAREAN SECTION  [id number]    CHOLECYSTECTOMY N/A 01/17/2019   Procedure: LAPAROSCOPIC CHOLECYSTECTOMY WITH INTRAOPERATIVE CHOLANGIOGRAM;  Surgeon: Alphonsa Overall, MD;  Location: [facility];  Service: General;  Laterality: N/A;   KNEE SURGERY     left knee   PERCUTANEOUS PINNING Right 08/18/2020   Procedure: Closed reduction and percutaneous pinning  of right ulnar fracture;  Surgeon: Cindra Presume, MD;  Location: [facility];  Service: Plastics;  Laterality: Right;  60 min   SHOULDER SURGERY     1996   SUPRACERVICAL ABDOMINAL HYSTERECTOMY  2013   TUBAL LIGATION  2011    Current Outpatient Medications  Medication Sig Dispense Refill   acetaminophen (TYLENOL) 500 MG tablet Take 1,000 mg by mouth every 6 (six) hours as needed for moderate pain or headache.     B Complex-Biotin-FA (B-COMPLEX PO) Take 1 capsule by mouth daily.     baclofen (LIORESAL) 20 MG tablet Take 1 tablet (20 mg total) by mouth at bedtime. 90 tablet 1   busPIRone (BUSPAR) 7.5 MG tablet Take 1 tablet (7.5 mg total) by mouth as needed. 30 tablet 1   cholecalciferol (VITAMIN D) 25 MCG (1000 UT) tablet Take 4,000 Units by mouth daily.     estradiol (ESTRACE) 0.1 MG/GM vaginal  cream 1 gram vaginally twice weekly 42.5 g 0   Estradiol 10 MCG TABS vaginal tablet Place one tablet vaginally qhs x 1 week, then change to 3 x a week (Patient taking differently: Place 10 mcg vaginally 3 (three) times a week.) 36 tablet 4   ibuprofen (ADVIL) 200 MG tablet Take 800 mg by mouth every 6 (six) hours as needed for mild pain or headache.     ezetimibe (ZETIA) 10 MG tablet Take 1 tablet (10 mg total) by mouth daily. 90 tablet 3   rosuvastatin (CRESTOR) 20 MG tablet Take 1 tablet (20 mg total) by mouth daily. 90 tablet 3   No current facility-administered medications for this visit.    Family History  Problem Relation Age of Onset   Breast cancer Maternal Aunt    Stroke Mother    COPD Mother    Bladder Cancer Father    Melanoma Father    Heart attack Father     Diabetes Maternal Grandmother     Review of Systems  Genitourinary:  Positive for vaginal pain.   Exam:   BP 122/74   Pulse 70   Ht '5\' 4"'$  (1.626 m)   Wt 156 lb (70.8 kg)   SpO2 99%   BMI 26.78 kg/m   Weight change: '@WEIGHTCHANGE'$ @ Height:   Height: '5\' 4"'$  (162.6 cm)  Ht Readings from Last 3 Encounters:  03/02/21 '5\' 4"'$  (1.626 m)  01/02/21 '5\' 4"'$  (1.626 m)  12/29/20 '5\' 4"'$  (1.626 m)    General appearance: alert, cooperative and appears stated age Head: Normocephalic, without obvious abnormality, atraumatic Neck: no adenopathy, supple, symmetrical, trachea midline and thyroid normal to inspection and palpation Lungs: clear to auscultation bilaterally Cardiovascular: regular rate and rhythm Breasts: normal appearance, no masses or tenderness Abdomen: soft, non-tender; non distended,  no masses,  no organomegaly Extremities: extremities normal, atraumatic, no cyanosis or edema Skin: Skin color, texture, turgor normal. No rashes or lesions Lymph nodes: Cervical, supraclavicular, and axillary nodes normal. No abnormal inguinal nodes palpated Neurologic: Grossly normal   Pelvic: External genitalia:  no lesions              Urethra:  normal appearing urethra with no masses, tenderness or lesions              Bartholins and Skenes: normal                 Vagina: normal appearing vagina with improvement in estrogen effect. Able to insert 2 fingers almost the entire way (big improvement)              Cervix: no lesions               Bimanual Exam:  Uterus:  normal size, contour, position, consistency, mobility, non-tender              Adnexa: no mass, fullness, tenderness               Rectovaginal: Confirms               Anus:  normal sphincter tone, no lesions  Gae Dry chaperoned for the exam.  1. Well woman exam Discussed breast self exam Discussed calcium and vit D intake Labs with primary Mammogram UTD  2. Screening for cervical cancer - Cytology - PAP  3. Colon  cancer screening - Ambulatory referral to Gastroenterology  4. Vaginal atrophy Definite improvement with vaginal estrogen - Estradiol 10 MCG TABS vaginal tablet; Place one tablet vaginally 3  x a week  Dispense: 36 tablet; Refill: 4  5. Dyspareunia, female Improving. She will use lubrication and control rate and depth of penetration. We discussed the option of vaginal dilators if needed  6. H/O healed fragility fracture Fractured her shoulder with a fall from the standing position - DG Bone Density; Future  7. Hypoestrogenism - DG Bone Density; Future

## 2021-03-02 ENCOUNTER — Other Ambulatory Visit (HOSPITAL_COMMUNITY)
Admission: RE | Admit: 2021-03-02 | Discharge: 2021-03-02 | Disposition: A | Discharge status: Home / Self Care | Payer: BC Managed Care – PPO | Source: Ambulatory Visit | Attending: Obstetrics and Gynecology | Admitting: Obstetrics and Gynecology

## 2021-03-02 ENCOUNTER — Other Ambulatory Visit: Payer: Self-pay

## 2021-03-02 ENCOUNTER — Ambulatory Visit (INDEPENDENT_AMBULATORY_CARE_PROVIDER_SITE_OTHER): Payer: BC Managed Care – PPO | Admitting: Obstetrics and Gynecology

## 2021-03-02 ENCOUNTER — Encounter: Payer: Self-pay | Admitting: Obstetrics and Gynecology

## 2021-03-02 VITALS — BP 122/74 | HR 70 | Ht 64.0 in | Wt 156.0 lb

## 2021-03-02 DIAGNOSIS — M25531 Pain in right wrist: Secondary | ICD-10-CM | POA: Diagnosis not present

## 2021-03-02 DIAGNOSIS — N952 Postmenopausal atrophic vaginitis: Secondary | ICD-10-CM

## 2021-03-02 DIAGNOSIS — Z01419 Encounter for gynecological examination (general) (routine) without abnormal findings: Secondary | ICD-10-CM

## 2021-03-02 DIAGNOSIS — Z1211 Encounter for screening for malignant neoplasm of colon: Secondary | ICD-10-CM

## 2021-03-02 DIAGNOSIS — M25541 Pain in joints of right hand: Secondary | ICD-10-CM | POA: Diagnosis not present

## 2021-03-02 DIAGNOSIS — M25511 Pain in right shoulder: Secondary | ICD-10-CM | POA: Diagnosis not present

## 2021-03-02 DIAGNOSIS — Z124 Encounter for screening for malignant neoplasm of cervix: Secondary | ICD-10-CM | POA: Diagnosis not present

## 2021-03-02 DIAGNOSIS — E2839 Other primary ovarian failure: Secondary | ICD-10-CM

## 2021-03-02 DIAGNOSIS — M25611 Stiffness of right shoulder, not elsewhere classified: Secondary | ICD-10-CM | POA: Diagnosis not present

## 2021-03-02 DIAGNOSIS — Z87310 Personal history of (healed) osteoporosis fracture: Secondary | ICD-10-CM

## 2021-03-02 DIAGNOSIS — N941 Unspecified dyspareunia: Secondary | ICD-10-CM

## 2021-03-02 MED ORDER — ESTRADIOL 10 MCG VA TABS
ORAL_TABLET | VAGINAL | 4 refills | Status: DC
Start: 2021-03-02 — End: 2021-06-20

## 2021-03-02 NOTE — Patient Instructions (Signed)

## 2021-03-06 LAB — CYTOLOGY - PAP
Comment: NEGATIVE
Diagnosis: NEGATIVE
High risk HPV: NEGATIVE

## 2021-03-07 ENCOUNTER — Encounter: Payer: Self-pay | Admitting: Gastroenterology

## 2021-03-07 DIAGNOSIS — M25511 Pain in right shoulder: Secondary | ICD-10-CM | POA: Diagnosis not present

## 2021-03-07 DIAGNOSIS — M25541 Pain in joints of right hand: Secondary | ICD-10-CM | POA: Diagnosis not present

## 2021-03-07 DIAGNOSIS — M25611 Stiffness of right shoulder, not elsewhere classified: Secondary | ICD-10-CM | POA: Diagnosis not present

## 2021-03-07 DIAGNOSIS — M25531 Pain in right wrist: Secondary | ICD-10-CM | POA: Diagnosis not present

## 2021-03-09 DIAGNOSIS — M25541 Pain in joints of right hand: Secondary | ICD-10-CM | POA: Diagnosis not present

## 2021-03-09 DIAGNOSIS — M25531 Pain in right wrist: Secondary | ICD-10-CM | POA: Diagnosis not present

## 2021-03-09 DIAGNOSIS — M25611 Stiffness of right shoulder, not elsewhere classified: Secondary | ICD-10-CM | POA: Diagnosis not present

## 2021-03-09 DIAGNOSIS — M25511 Pain in right shoulder: Secondary | ICD-10-CM | POA: Diagnosis not present

## 2021-03-14 DIAGNOSIS — M25611 Stiffness of right shoulder, not elsewhere classified: Secondary | ICD-10-CM | POA: Diagnosis not present

## 2021-03-14 DIAGNOSIS — M25541 Pain in joints of right hand: Secondary | ICD-10-CM | POA: Diagnosis not present

## 2021-03-14 DIAGNOSIS — M25531 Pain in right wrist: Secondary | ICD-10-CM | POA: Diagnosis not present

## 2021-03-14 DIAGNOSIS — M25511 Pain in right shoulder: Secondary | ICD-10-CM | POA: Diagnosis not present

## 2021-03-15 ENCOUNTER — Telehealth: Payer: Self-pay | Admitting: *Deleted

## 2021-03-15 NOTE — Telephone Encounter (Signed)
Patient was seen on 03/02/21 for annual exam and Rx for generic vagifem 10 mcg tablets was sent to pharmacy. Patient said the pharmacy informed her that insurance won't pay for this Rx until 9/31/22. Patient is currently using estradiol vaginal cream 0.1 1 gram twice week. She asked if you would approve 1 more Rx for estradiol vaginal cream that will last her for the next 2-3 weeks. Please advise

## 2021-03-15 NOTE — Telephone Encounter (Signed)
I'm concerned if we refill her estrace cream that they won't refill her vaginal tablets at the end of the month. Options are to refill the cream, with one gram 3 x a week, or we can call in a one month supply to the compounding pharmacy.

## 2021-03-15 NOTE — Telephone Encounter (Signed)
Patient informed with below, states she is definitely not using more than 1 gram. Patient said you told her on 03/02/21 she can increase and use 3 times weekly if needed and she has been doing this since 03/02/21. I told patient I do see you sent estradiol 10 mcg with directions of 3 times weekly, however patient did say again you told her she can use the cream this way as well.  Please advise

## 2021-03-15 NOTE — Telephone Encounter (Signed)
She was given a 42.5 gram tube of estrogen on 02/02/21. She should only be using 1 gram (small amount) 2 x a week. A 42.5 gram tube should last over 20 weeks. She shouldn't need a refill to get to the end of the month. I'm concerned she is using way too much of the estrogen. Please discuss this with her and try to figure out how much she is using.

## 2021-03-16 ENCOUNTER — Other Ambulatory Visit: Payer: BC Managed Care – PPO

## 2021-03-16 ENCOUNTER — Other Ambulatory Visit: Payer: Self-pay | Admitting: Obstetrics and Gynecology

## 2021-03-16 MED ORDER — ESTRADIOL 0.1 MG/GM VA CREA: TOPICAL_CREAM | VAGINAL | 0 refills | Status: DC

## 2021-03-16 NOTE — Telephone Encounter (Signed)
Patient asked me to send in estradiol 0.1 mg cream, I checked with pharmacy and Rx does not come in any tube size besides 42.5 g. Rx sent.

## 2021-03-24 DIAGNOSIS — M25541 Pain in joints of right hand: Secondary | ICD-10-CM | POA: Diagnosis not present

## 2021-03-24 DIAGNOSIS — M25511 Pain in right shoulder: Secondary | ICD-10-CM | POA: Diagnosis not present

## 2021-03-24 DIAGNOSIS — M25611 Stiffness of right shoulder, not elsewhere classified: Secondary | ICD-10-CM | POA: Diagnosis not present

## 2021-03-24 DIAGNOSIS — M25531 Pain in right wrist: Secondary | ICD-10-CM | POA: Diagnosis not present

## 2021-03-27 DIAGNOSIS — M25611 Stiffness of right shoulder, not elsewhere classified: Secondary | ICD-10-CM | POA: Diagnosis not present

## 2021-03-27 DIAGNOSIS — M25541 Pain in joints of right hand: Secondary | ICD-10-CM | POA: Diagnosis not present

## 2021-03-27 DIAGNOSIS — M25531 Pain in right wrist: Secondary | ICD-10-CM | POA: Diagnosis not present

## 2021-03-27 DIAGNOSIS — M25511 Pain in right shoulder: Secondary | ICD-10-CM | POA: Diagnosis not present

## 2021-03-28 ENCOUNTER — Emergency Department (HOSPITAL_BASED_OUTPATIENT_CLINIC_OR_DEPARTMENT_OTHER)
Admission: EM | Admit: 2021-03-28 | Discharge: 2021-03-28 | Disposition: A | Discharge status: Home / Self Care | Payer: BC Managed Care – PPO | Attending: Emergency Medicine | Admitting: Emergency Medicine

## 2021-03-28 ENCOUNTER — Emergency Department (HOSPITAL_BASED_OUTPATIENT_CLINIC_OR_DEPARTMENT_OTHER): Payer: BC Managed Care – PPO

## 2021-03-28 ENCOUNTER — Other Ambulatory Visit: Payer: Self-pay

## 2021-03-28 ENCOUNTER — Emergency Department (HOSPITAL_BASED_OUTPATIENT_CLINIC_OR_DEPARTMENT_OTHER): Payer: BC Managed Care – PPO | Admitting: Radiology

## 2021-03-28 ENCOUNTER — Encounter (HOSPITAL_BASED_OUTPATIENT_CLINIC_OR_DEPARTMENT_OTHER): Payer: Self-pay | Admitting: Emergency Medicine

## 2021-03-28 DIAGNOSIS — M79661 Pain in right lower leg: Secondary | ICD-10-CM | POA: Insufficient documentation

## 2021-03-28 DIAGNOSIS — M25551 Pain in right hip: Secondary | ICD-10-CM | POA: Diagnosis not present

## 2021-03-28 DIAGNOSIS — Z79899 Other long term (current) drug therapy: Secondary | ICD-10-CM | POA: Insufficient documentation

## 2021-03-28 DIAGNOSIS — R079 Chest pain, unspecified: Secondary | ICD-10-CM | POA: Diagnosis not present

## 2021-03-28 DIAGNOSIS — R0789 Other chest pain: Secondary | ICD-10-CM | POA: Diagnosis not present

## 2021-03-28 LAB — BASIC METABOLIC PANEL
Anion gap: 11 (ref 5–15)
BUN: 13 mg/dL (ref 6–20)
CO2: 26 mmol/L (ref 22–32)
Calcium: 10.4 mg/dL — ABNORMAL HIGH (ref 8.9–10.3)
Chloride: 104 mmol/L (ref 98–111)
Creatinine, Ser: 0.75 mg/dL (ref 0.44–1.00)
GFR, Estimated: >60 mL/min (ref >60)
Glucose, Bld: 107 mg/dL — ABNORMAL HIGH (ref 70–99)
Potassium: 4.1 mmol/L (ref 3.5–5.1)
Sodium: 141 mmol/L (ref 135–145)

## 2021-03-28 LAB — TROPONIN I (HIGH SENSITIVITY): Troponin I (High Sensitivity): <2 ng/L (ref <18)

## 2021-03-28 LAB — CBC
HCT: 37.1 % (ref 36.0–46.0)
Hemoglobin: 12.5 g/dL (ref 12.0–15.0)
MCH: 29.7 pg (ref 26.0–34.0)
MCHC: 33.7 g/dL (ref 30.0–36.0)
MCV: 88.1 fL (ref 80.0–100.0)
Platelets: 169 10*3/uL (ref 150–400)
RBC: 4.21 MIL/uL (ref 3.87–5.11)
RDW: 12.9 % (ref 11.5–15.5)
WBC: 4.4 10*3/uL (ref 4.0–10.5)
nRBC: 0 % (ref 0.0–0.2)

## 2021-03-28 NOTE — ED Triage Notes (Signed)
Pt reports pain to R calf and back of thigh since yesterday. She is concerned about DVT

## 2021-03-28 NOTE — ED Provider Notes (Signed)
[facility] Provider Note   CSN: [id number] Arrival date & time: 03/28/21  1038     History Chief Complaint  Patient presents with   Leg Pain    [facility] is a 50 y.o. female with a past medical history significant for hyperlipidemia, anemia, anxiety, depression, and endometriosis who presents to the ED due to sudden onset of right calf pain x1 day.  Patient describes pain as a "cramping sensation".Patient states pain radiates to her right calf to the posterior aspect of her right thigh.  No history of DVT.  Denies recent surgeries, recent long immobilizations, or lower extremity edema.  Patient uses estradiol Christine cream however, no other hormonal treatments.  She admits to intermittent episodes of chest pain.  No shortness of breath.  No injury to right leg.  Patient states she was rowing and believes maybe she "overdid it".  Denies associated numbness/tingling.  No low back pain. No fever or chills.   History obtained from patient and past medical records. No interpreter used during encounter.       Past Medical History:  Diagnosis Date   Anemia    Anxiety    Complication of anesthesia    Depression    Endometriosis    Fracture of right ulnar styloid    Hyperlipidemia    PONV (postoperative nausea and vomiting)     Patient Active Problem List   Diagnosis Date Noted   Cervicogenic headache 10/18/2019   Generalized anxiety disorder 02/11/2018   Hormone replacement therapy (HRT) 02/11/2018   Spinal stenosis of cervical region 02/11/2018   Familial hyperlipidemia 02/11/2018   Vitamin D deficiency 02/11/2018   Attention deficit hyperactivity disorder, predominantly inattentive type 06/27/2011    Past Surgical History:  Procedure Laterality Date   ABDOMINAL HYSTERECTOMY     BUNIONECTOMY     2008   CERVICAL SPINE SURGERY  2011,2012,2017   ACDF repair w/instrumentation   CESAREAN SECTION  [id number]   CHOLECYSTECTOMY N/A  01/17/2019   Procedure: LAPAROSCOPIC CHOLECYSTECTOMY WITH INTRAOPERATIVE CHOLANGIOGRAM;  Surgeon: Alphonsa Overall, MD;  Location: [facility];  Service: General;  Laterality: N/A;   KNEE SURGERY     left knee   PERCUTANEOUS PINNING Right 08/18/2020   Procedure: Closed reduction and percutaneous pinning  of right ulnar fracture;  Surgeon: Cindra Presume, MD;  Location: [facility];  Service: Plastics;  Laterality: Right;  60 min   [place]   SUPRACERVICAL ABDOMINAL HYSTERECTOMY  2013   TUBAL LIGATION  2011     OB History     Gravida  3   Para  3   Term  2   Preterm  1   AB      Living  3      SAB      IAB      Ectopic      Multiple      Live Births  3           Family History  Problem Relation Age of Onset   Breast cancer Maternal Aunt    Stroke Mother    COPD Mother    Bladder Cancer Father    Melanoma Father    Heart attack Father    Diabetes Maternal Grandmother     Social History   Tobacco Use   Smoking status: Never   Smokeless tobacco: Never  Vaping Use   Vaping Use: Never used  Substance Use Topics  Alcohol use: Not Currently   Drug use: Never    Home Medications Prior to Admission medications   Medication Sig Start Date End Date Taking? Authorizing Provider  acetaminophen (TYLENOL) 500 MG tablet Take 1,000 mg by mouth every 6 (six) hours as needed for moderate pain or headache.   Yes [provider]  baclofen (LIORESAL) 20 MG tablet Take 1 tablet (20 mg total) by mouth at bedtime. 09/29/20  Yes Orma Flaming, MD  busPIRone (BUSPAR) 7.5 MG tablet Take 1 tablet (7.5 mg total) by mouth as needed. 02/23/21  Yes Leamon Arnt, MD  calcium carbonate (OSCAL) 1500 (600 Ca) MG TABS tablet Take 600 mg of elemental calcium by mouth 2 (two) times daily with a meal.   Yes [provider]  cholecalciferol (VITAMIN D) 25 MCG (1000 UT) tablet Take 4,000 Units by mouth daily.   Yes [provider]   estradiol (ESTRACE) 0.1 MG/GM Christine cream Insert 1 gram vaginally 3 times weekly 03/16/21  Yes Salvadore Dom, MD  ezetimibe (ZETIA) 10 MG tablet Take 1 tablet (10 mg total) by mouth daily. 11/21/20 03/28/21 Yes Hilty, Nadean Corwin, MD  ibuprofen (ADVIL) 200 MG tablet Take 800 mg by mouth every 6 (six) hours as needed for mild pain or headache.   Yes [provider]  rosuvastatin (CRESTOR) 20 MG tablet Take 1 tablet (20 mg total) by mouth daily. 09/02/20 03/28/21 Yes Hilty, Nadean Corwin, MD  Estradiol 10 MCG TABS Christine tablet Place one tablet vaginally 3 x a week Patient not taking: Reported on 03/28/2021 03/02/21   Salvadore Dom, MD    Allergies    Meperidine hcl, Prochlorperazine edisylate, Propranolol, Trokendi xr [topiramate er], and Phentermine  Review of Systems   Review of Systems  Constitutional:  Negative for chills and fever.  Respiratory:  Negative for shortness of breath.   Cardiovascular:  Positive for chest pain. Negative for leg swelling.  Musculoskeletal:  Positive for arthralgias. Negative for back pain.  All other systems reviewed and are negative.  Physical Exam Updated Vital Signs BP (!) 157/94 (BP Location: Right Arm)   Pulse 76   Temp 98.3 F (36.8 C) (Oral)   Resp 18   Ht 5\' 4"  (1.626 m)   Wt 68.5 kg   SpO2 100%   BMI 25.92 kg/m   Physical Exam Vitals and nursing note reviewed.  Constitutional:      General: She is not in acute distress.    Appearance: She is not ill-appearing.  HENT:     Head: Normocephalic.  Eyes:     Pupils: Pupils are equal, round, and reactive to light.  Cardiovascular:     Rate and Rhythm: Normal rate and regular rhythm.     Pulses: Normal pulses.     Heart sounds: Normal heart sounds. No murmur heard.   No friction rub. No gallop.  Pulmonary:     Effort: Pulmonary effort is normal.     Breath sounds: Normal breath sounds.  Abdominal:     General: Abdomen is flat. There is no distension.     Palpations:  Abdomen is soft.     Tenderness: There is no abdominal tenderness. There is no guarding or rebound.  Musculoskeletal:        General: Normal range of motion.     Cervical back: Neck supple.     Comments: No lower extremity edema. Mild tenderness to posterior aspect of right thigh. Pedal pulses palpable. Soft compartments. No lumbar midline tenderness.  Skin:    General: Skin is warm and dry.  Neurological:     General: No focal deficit present.     Mental Status: She is alert.  Psychiatric:        Mood and Affect: Mood normal.        Behavior: Behavior normal.    ED Results / Procedures / Treatments   Labs (all labs ordered are listed, but only abnormal results are displayed) Labs Reviewed  BASIC METABOLIC PANEL - Abnormal; Notable for the following components:      Result Value   Glucose, Bld 107 (*)    Calcium 10.4 (*)    All other components within normal limits  CBC  TROPONIN I (HIGH SENSITIVITY)    EKG None  Radiology DG Chest 2 View  Result Date: 03/28/2021 CLINICAL DATA:  Chest pain. Additional history provided: Patient reports right calf and back of thigh pain since yesterday, concern for DVT, mild chest pain intermittently. EXAM: CHEST - 2 VIEW COMPARISON:  CT angiogram chest 12/16/2020. Chest radiographs 01/14/2019. FINDINGS: Heart size within normal limits. No appreciable airspace consolidation. No evidence of pleural effusion or pneumothorax. No acute bony abnormality identified. ACDF hardware. IMPRESSION: No evidence of active cardiopulmonary disease. Electronically Signed   By: Kellie Simmering D.O.   On: 03/28/2021 15:24   US Venous Img Lower Unilateral Right  Result Date: 03/28/2021 CLINICAL DATA:  Right leg pain. EXAM: RIGHT LOWER EXTREMITY VENOUS DOPPLER ULTRASOUND TECHNIQUE: Gray-scale sonography with graded compression, as well as color Doppler and duplex ultrasound were performed to evaluate the lower extremity deep venous systems from the level of the common  femoral vein and including the common femoral, femoral, profunda femoral, popliteal and calf veins including the posterior tibial, peroneal and gastrocnemius veins when visible. The superficial great saphenous vein was also interrogated. Spectral Doppler was utilized to evaluate flow at rest and with distal augmentation maneuvers in the common femoral, femoral and popliteal veins. COMPARISON:  None. FINDINGS: Contralateral Common Femoral Vein: Respiratory phasicity is normal and symmetric with the symptomatic side. No evidence of thrombus. Normal compressibility. Common Femoral Vein: No evidence of thrombus. Normal compressibility, respiratory phasicity and response to augmentation. Saphenofemoral Junction: No evidence of thrombus. Normal compressibility and flow on color Doppler imaging. Profunda Femoral Vein: No evidence of thrombus. Normal compressibility and flow on color Doppler imaging. Femoral Vein: No evidence of thrombus. Normal compressibility, respiratory phasicity and response to augmentation. Popliteal Vein: No evidence of thrombus. Normal compressibility, respiratory phasicity and response to augmentation. Calf Veins: No evidence of thrombus. Normal compressibility and flow on color Doppler imaging. Superficial Great Saphenous Vein: No evidence of thrombus. Normal compressibility. Venous Reflux:  None. Other Findings:  None. IMPRESSION: Negative for deep venous thrombosis in right lower extremity. Electronically Signed   By: Markus Daft M.D.   On: 03/28/2021 12:46    Procedures Procedures   Medications Ordered in ED Medications - No data to display  ED Course  I have reviewed the triage vital signs and the nursing notes.  Pertinent labs & imaging results that were available during my care of the patient were reviewed by me and considered in my medical decision making (see chart for details).    MDM Rules/Calculators/A&P                          50 year old female presents to the ED due  to right calf and thigh pain x1 day.  No trauma.  She  also endorses intermittent chest pain.  No shortness of breath.  No history of DVTs, recent surgeries, recent long immobilizations.  She is on estradiol however, no other hormonal treatments.  Upon arrival, patient afebrile, not tachycardic or hypoxic.  Patient in no acute distress.  Reassuring physical exam.  No lower extremity edema.  Bilateral lower extremities neurovascularly intact with soft compartments.  Ultrasound ordered at triage which is negative for DVT.  Added labs and troponin to rule out cardiac etiology of chest pain however, chest pain appears atypical in nature.  Low suspicion for bony fractures given no history of trauma.  No erythema or warmth to suggest cellulitis.  CBC unremarkable no leukocytosis.  Troponin normal.  Patient has not had any further chest pain episodes within the past 3 hours.  EKG demonstrates normal sinus rhythm no signs of acute ischemia.  Low suspicion for ACS. Low suspicion for PE. No episodes of hypoxia or tachycardia during her 5 hour ED stay. Suspect muscle soreness after strenuous physical activity. BMP reassuring.  Normal renal function.  No major electrolyte derangements.  Chest x-ray personally reviewed which is negative for signs of pneumonia, pneumothorax, or widened mediastinum.  Ultrasound negative for DVT. Advised patient to follow-up with PCP for further evaluation. Strict ED precautions discussed with patient. Patient states understanding and agrees to plan. Patient discharged home in no acute distress and stable vitals.  Final Clinical Impression(s) / ED Diagnoses Final diagnoses:  Right calf pain    Rx / DC Orders ED Discharge Orders     None        Karie Kirks 03/28/21 1542    Varney Biles, MD 03/28/21 1544

## 2021-03-28 NOTE — Discharge Instructions (Signed)
It was a pleasure taking care of you today.  As discussed, your ultrasound did not show any blood clots.  All of your labs and chest x-ray were reassuring.  You may take over-the-counter ibuprofen or Tylenol as needed for pain.  Please follow-up with PCP within 1 week for further evaluation.  Return to the ER for new or worsening symptoms.

## 2021-03-29 DIAGNOSIS — F411 Generalized anxiety disorder: Secondary | ICD-10-CM | POA: Diagnosis not present

## 2021-03-30 DIAGNOSIS — M25511 Pain in right shoulder: Secondary | ICD-10-CM | POA: Diagnosis not present

## 2021-03-30 DIAGNOSIS — M25611 Stiffness of right shoulder, not elsewhere classified: Secondary | ICD-10-CM | POA: Diagnosis not present

## 2021-03-30 DIAGNOSIS — M25541 Pain in joints of right hand: Secondary | ICD-10-CM | POA: Diagnosis not present

## 2021-03-30 DIAGNOSIS — M25531 Pain in right wrist: Secondary | ICD-10-CM | POA: Diagnosis not present

## 2021-03-31 ENCOUNTER — Ambulatory Visit
Admission: RE | Admit: 2021-03-31 | Discharge: 2021-03-31 | Disposition: A | Discharge status: Home / Self Care | Payer: BC Managed Care – PPO | Source: Ambulatory Visit | Attending: Obstetrics and Gynecology | Admitting: Obstetrics and Gynecology

## 2021-03-31 ENCOUNTER — Other Ambulatory Visit: Payer: Self-pay

## 2021-03-31 DIAGNOSIS — M85851 Other specified disorders of bone density and structure, right thigh: Secondary | ICD-10-CM | POA: Diagnosis not present

## 2021-03-31 DIAGNOSIS — M810 Age-related osteoporosis without current pathological fracture: Secondary | ICD-10-CM | POA: Diagnosis not present

## 2021-03-31 DIAGNOSIS — Z780 Asymptomatic menopausal state: Secondary | ICD-10-CM | POA: Diagnosis not present

## 2021-03-31 DIAGNOSIS — E2839 Other primary ovarian failure: Secondary | ICD-10-CM

## 2021-03-31 DIAGNOSIS — E7800 Pure hypercholesterolemia, unspecified: Secondary | ICD-10-CM | POA: Diagnosis not present

## 2021-03-31 DIAGNOSIS — Z87310 Personal history of (healed) osteoporosis fracture: Secondary | ICD-10-CM

## 2021-03-31 LAB — LIPID PANEL
Chol/HDL Ratio: 2.3 ratio (ref 0.0–4.4)
Cholesterol, Total: 152 mg/dL (ref 100–199)
HDL: 66 mg/dL (ref >39)
LDL Chol Calc (NIH): 75 mg/dL (ref 0–99)
Triglycerides: 54 mg/dL (ref 0–149)
VLDL Cholesterol Cal: 11 mg/dL (ref 5–40)

## 2021-04-07 ENCOUNTER — Encounter: Payer: Self-pay | Admitting: Internal Medicine

## 2021-04-07 ENCOUNTER — Ambulatory Visit (INDEPENDENT_AMBULATORY_CARE_PROVIDER_SITE_OTHER): Payer: BC Managed Care – PPO | Admitting: Internal Medicine

## 2021-04-07 ENCOUNTER — Other Ambulatory Visit: Payer: Self-pay

## 2021-04-07 VITALS — BP 110/70 | HR 62 | Ht 64.0 in | Wt 145.0 lb

## 2021-04-07 DIAGNOSIS — R55 Syncope and collapse: Secondary | ICD-10-CM

## 2021-04-07 DIAGNOSIS — E7800 Pure hypercholesterolemia, unspecified: Secondary | ICD-10-CM

## 2021-04-07 DIAGNOSIS — R931 Abnormal findings on diagnostic imaging of heart and coronary circulation: Secondary | ICD-10-CM

## 2021-04-07 DIAGNOSIS — R002 Palpitations: Secondary | ICD-10-CM

## 2021-04-07 NOTE — Progress Notes (Signed)
LIPID CLINIC CONSULT NOTE  Chief Complaint:  Follow-up dyslipidemia  Primary Care Physician: Leamon Arnt, MD  Primary Cardiologist:  None  HPI:  Christine Nash is a 50 y.o. female who is being seen today for the evaluation of dyslipidemia at the request of Orma Flaming, MD. This is a pleasant 50 year old female who unfortunately recently had to have surgery to have a right ulnar fracture and is currently in a cast and sling.  She was referred by Dr. Eliberto Ivory for evaluation of dyslipidemia.  Wonderfully, she is actually lost 30 to 40 pounds recently with significant dietary changes and activity prior to this recent fracture.  Despite the weight loss, however her cholesterol did not significantly improve in fact it actually has increased compared to where it was about 2 years ago.  Total cholesterol now 281, triglycerides 58, HDL 70 and LDL 199.  She does not report a strong family history of heart disease.  I wonder if her HDL of 70 may be cardioprotective for her.  Overall she reports eating a very healthy sounding diet.  She is cut out a lot of sources of saturated fats.  11/21/2020  Christine Nash returns today for follow-up.  She seems to be tolerating rosuvastatin without any significant side effects.  Cholesterol has come down significantly.  Total cholesterol is now 172, triglycerides 54, HDL 75 and LDL of 86.  04/07/2021  Christine Nash is seen today for follow-up.  Overall she is doing well.  Her cholesterol is trending even a little lower.  Most recent labs showed total cholesterol 152, triglycerides 54, HDL 66 and LDL 75, is on combination 20 mg rosuvastatin and 10 mg of ezetimibe.  Looking back about 8 months ago total cholesterol was 281 with an LDL of 199 and 2 years prior to that the LDL was 197.  This is suggestive of possible familial hyperlipidemia however she also has significant weight loss and dietary changes and therefore this might be metabolic.  Reports she had her  daughter's cholesterol tested who is 30 years old and it was reportedly slightly high.  PMHx:  Past Medical History:  Diagnosis Date   Anemia    Anxiety    Complication of anesthesia    Depression    Endometriosis    Fracture of right ulnar styloid    Hyperlipidemia    PONV (postoperative nausea and vomiting)     Past Surgical History:  Procedure Laterality Date   ABDOMINAL HYSTERECTOMY     BUNIONECTOMY     2008   CERVICAL SPINE SURGERY  2011,2012,2017   ACDF repair w/instrumentation   CESAREAN SECTION  4098,1191   CHOLECYSTECTOMY N/A 01/17/2019   Procedure: LAPAROSCOPIC CHOLECYSTECTOMY WITH INTRAOPERATIVE CHOLANGIOGRAM;  Surgeon: Alphonsa Overall, MD;  Location: [facility];  Service: General;  Laterality: N/A;   KNEE SURGERY     left knee   PERCUTANEOUS PINNING Right 08/18/2020   Procedure: Closed reduction and percutaneous pinning  of right ulnar fracture;  Surgeon: Cindra Presume, MD;  Location: [facility];  Service: Plastics;  Laterality: Right;  60 min   [place]   SUPRACERVICAL ABDOMINAL HYSTERECTOMY  2013   TUBAL LIGATION  2011    FAMHx:  Family History  Problem Relation Age of Onset   Breast cancer Maternal Aunt    Stroke Mother    COPD Mother    Bladder Cancer Father    Melanoma Father    Heart attack Father  Diabetes Maternal Grandmother     SOCHx:   reports that she has never smoked. She has never used smokeless tobacco. She reports that she does not currently use alcohol. She reports that she does not use drugs.  ALLERGIES:  Allergies  Allergen Reactions   Meperidine Hcl Hives   Prochlorperazine Edisylate     Other reaction(s): agitiation   Propranolol     "brain fog" fatigue    Trokendi [employer]] Other (See Comments)    "brain fog"    Phentermine Palpitations    Chest pain / palpitations     ROS: Pertinent items noted in HPI and remainder of comprehensive ROS otherwise negative.  HOME MEDS: Current  Outpatient Medications on File Prior to Visit  Medication Sig Dispense Refill   acetaminophen (TYLENOL) 500 MG tablet Take 1,000 mg by mouth every 6 (six) hours as needed for moderate pain or headache.     amoxicillin (AMOXIL) 500 MG tablet Take by mouth.     baclofen (LIORESAL) 20 MG tablet Take 1 tablet (20 mg total) by mouth at bedtime. 90 tablet 1   cholecalciferol (VITAMIN D) 25 MCG (1000 UT) tablet Take 4,000 Units by mouth daily.     Estradiol 10 MCG TABS vaginal tablet Place one tablet vaginally 3 x a week 36 tablet 4   ibuprofen (ADVIL) 200 MG tablet Take 800 mg by mouth every 6 (six) hours as needed for mild pain or headache.     ibuprofen (ADVIL) 800 MG tablet Take 800 mg by mouth every 6 (six) hours as needed.     ezetimibe (ZETIA) 10 MG tablet Take 1 tablet (10 mg total) by mouth daily. 90 tablet 3   rosuvastatin (CRESTOR) 20 MG tablet Take 1 tablet (20 mg total) by mouth daily. 90 tablet 3   No current facility-administered medications on file prior to visit.    LABS/IMAGING: No results found for this or any previous visit (from the past 48 hour(s)). No results found.  LIPID PANEL:    Component Value Date/Time   CHOL 152 03/31/2021 0855   TRIG 54 03/31/2021 0855   HDL 66 03/31/2021 0855   CHOLHDL 2.3 03/31/2021 0855   CHOLHDL 4 08/10/2020 1016   VLDL 11.6 08/10/2020 1016   LDLCALC 75 03/31/2021 0855    WEIGHTS: Wt Readings from Last 3 Encounters:  04/07/21 145 lb (65.8 kg)  03/28/21 151 lb (68.5 kg)  03/02/21 156 lb (70.8 kg)    VITALS: BP 110/70   Pulse 62   Ht 5\' 4"  (1.626 m)   Wt 145 lb (65.8 kg)   SpO2 97%   BMI 24.89 kg/m   EXAM: Deferred  EKG: Deferred  ASSESSMENT: Mixed dyslipidemia with LDL greater than 190 Recent significant intentional weight loss Elevated CAC score of 42, 95th percentile for age and sex matched controls Incidental pulmonary nodule-scheduled for 1 year CT follow-up  PLAN: 1.   Christine Nash has had good response to  both statin and ezetimibe with LDL now at 75.  She is done well with weight loss and dietary changes.  Is not clear whether her dyslipidemia was metabolic or perhaps whether she has a familial hyperlipidemia.  I do not see a clear pattern in her family that would suggest that.  We discussed the possibility of genetic testing as she was concerned that her daughters cholesterol was slightly high at age 85.  She will check with her insurance company to see whether testing would be covered but is  interested in genetic testing.  Plan follow-up with me in 6 months and we will check a lipid profile and LP(a) at that time.  Finally, she reports no further syncopal episodes. He monitor was negative for arrhythmias.  Christine Casino, MD, [facility], [place] Director of the Advanced Lipid Disorders &  Cardiovascular Risk Reduction Clinic Diplomate of the American Board of Clinical Lipidology Attending Cardiologist  Direct Dial: [phone]  Fax: [phone]  Website:  [email or web address] Sula Fetterly 04/07/2021, 9:27 AM

## 2021-04-07 NOTE — Patient Instructions (Signed)
Medication Instructions:  NO CHANGES *If you need a refill on your cardiac medications before your next appointment, please call your pharmacy*   Lab Work: FASTING lab work in about 6 months to check cholesterol   Lipid Panel & LP(a)  If you have labs (blood work) drawn today and your tests are completely normal, you will receive your results only by: [place] (if you have MyChart) OR A paper copy in the mail If you have any lab test that is abnormal or we need to change your treatment, we will call you to review the results.   Testing/Procedures: Diplomatic Services operational officer - if interested/covered with insurance, please contact our office Eliezer Lofts E., RN)   Follow-Up: At [facility], you and your health needs are our priority.  As part of our continuing mission to provide you with exceptional heart care, we have created designated Provider Care Teams.  These Care Teams include your primary Cardiologist (physician) and Advanced Practice Providers (APPs -  Physician Assistants and Nurse Practitioners) who all work together to provide you with the care you need, when you need it.  We recommend signing up for the patient portal called "MyChart".  Sign up information is provided on this After Visit Summary.  MyChart is used to connect with patients for Virtual Visits (Telemedicine).  Patients are able to view lab/test results, encounter notes, upcoming appointments, etc.  Non-urgent messages can be sent to your provider as well.   To learn more about what you can do with MyChart, go to [email or web address].    Your next appointment:   6 month(s)  The format for your next appointment:   In Person  Provider:   K. Mali Hilty, MD   Other Instructions

## 2021-04-10 DIAGNOSIS — M25531 Pain in right wrist: Secondary | ICD-10-CM | POA: Diagnosis not present

## 2021-04-10 DIAGNOSIS — M25541 Pain in joints of right hand: Secondary | ICD-10-CM | POA: Diagnosis not present

## 2021-04-10 DIAGNOSIS — M25511 Pain in right shoulder: Secondary | ICD-10-CM | POA: Diagnosis not present

## 2021-04-10 DIAGNOSIS — M25611 Stiffness of right shoulder, not elsewhere classified: Secondary | ICD-10-CM | POA: Diagnosis not present

## 2021-04-11 ENCOUNTER — Other Ambulatory Visit: Payer: Self-pay

## 2021-04-11 ENCOUNTER — Encounter: Payer: Self-pay | Admitting: Obstetrics and Gynecology

## 2021-04-11 ENCOUNTER — Ambulatory Visit (INDEPENDENT_AMBULATORY_CARE_PROVIDER_SITE_OTHER): Payer: BC Managed Care – PPO | Admitting: Obstetrics and Gynecology

## 2021-04-11 VITALS — BP 110/64 | HR 66 | Ht 64.0 in | Wt 153.4 lb

## 2021-04-11 DIAGNOSIS — M810 Age-related osteoporosis without current pathological fracture: Secondary | ICD-10-CM | POA: Diagnosis not present

## 2021-04-11 MED ORDER — ALENDRONATE SODIUM 70 MG PO TABS: 70.0000 mg | ORAL_TABLET | ORAL | 3 refills | Status: DC

## 2021-04-11 NOTE — Progress Notes (Signed)
GYNECOLOGY  VISIT   HPI: 50 y.o.   Married White or Caucasian Not Hispanic or Latino  female   [phone] with No LMP recorded. Patient has had a hysterectomy.   here to discuss bone density result.  H/O fragility fracture. Recent DEXA with T score of -2.6. Surgical menopause at 33.  Takes vit d daily, limited calcium intake in her diet. Normal vit d level in 2/22. She has a rowing machine, uses it 3-4 days a week (when not injured). No GERD.  Non smoker, rare ETOH.   GYNECOLOGIC HISTORY: No LMP recorded. Patient has had a hysterectomy. Contraception:Hysteretomy Menopausal hormone therapy:  Yes estradiol tab         OB History     Gravida  3   Para  3   Term  2   Preterm  1   AB      Living  3      SAB      IAB      Ectopic      Multiple      Live Births  3              Patient Active Problem List   Diagnosis Date Noted   Cervicogenic headache 10/18/2019   Generalized anxiety disorder 02/11/2018   Spinal stenosis of cervical region 02/11/2018   Familial hyperlipidemia 02/11/2018   Vitamin D deficiency 02/11/2018   Attention deficit hyperactivity disorder, predominantly inattentive type 06/27/2011    Past Medical History:  Diagnosis Date   Anemia    Anxiety    Complication of anesthesia    Depression    Endometriosis    Fracture of right ulnar styloid    Hyperlipidemia    PONV (postoperative nausea and vomiting)     Past Surgical History:  Procedure Laterality Date   BUNIONECTOMY     2008   CERVICAL SPINE SURGERY  2011,2012,2017   ACDF repair w/instrumentation   CESAREAN SECTION  3662,9476   CHOLECYSTECTOMY N/A 01/17/2019   Procedure: LAPAROSCOPIC CHOLECYSTECTOMY WITH INTRAOPERATIVE CHOLANGIOGRAM;  Surgeon: Alphonsa Overall, MD;  Location: [facility];  Service: General;  Laterality: N/A;   KNEE SURGERY     left knee   LAPAROSCOPIC SUPRACERVICAL HYSTERECTOMY     with BSO, at age 42   PERCUTANEOUS PINNING Right 08/18/2020   Procedure: Closed  reduction and percutaneous pinning  of right ulnar fracture;  Surgeon: Cindra Presume, MD;  Location: [facility];  Service: Plastics;  Laterality: Right;  60 min   SHOULDER SURGERY     1996   SUPRACERVICAL ABDOMINAL HYSTERECTOMY  2013   TUBAL LIGATION  2011    Current Outpatient Medications  Medication Sig Dispense Refill   acetaminophen (TYLENOL) 500 MG tablet Take 1,000 mg by mouth every 6 (six) hours as needed for moderate pain or headache.     amoxicillin (AMOXIL) 500 MG tablet Take by mouth.     baclofen (LIORESAL) 20 MG tablet Take 1 tablet (20 mg total) by mouth at bedtime. 90 tablet 1   busPIRone (BUSPAR) 7.5 MG tablet Take 7.5 mg by mouth daily.     cholecalciferol (VITAMIN D) 25 MCG (1000 UT) tablet Take 4,000 Units by mouth daily.     Estradiol 10 MCG TABS vaginal tablet Place one tablet vaginally 3 x a week 36 tablet 4   ibuprofen (ADVIL) 800 MG tablet Take 800 mg by mouth every 6 (six) hours as needed.     ezetimibe (ZETIA) 10 MG tablet  Take 1 tablet (10 mg total) by mouth daily. 90 tablet 3   ibuprofen (ADVIL) 200 MG tablet Take 800 mg by mouth every 6 (six) hours as needed for mild pain or headache. (Patient not taking: Reported on 04/11/2021)     rosuvastatin (CRESTOR) 20 MG tablet Take 1 tablet (20 mg total) by mouth daily. 90 tablet 3   No current facility-administered medications for this visit.     ALLERGIES: Meperidine hcl, Prochlorperazine edisylate, Propranolol, Trokendi xr [topiramate er], and Phentermine  Family History  Problem Relation Age of Onset   Breast cancer Maternal Aunt    Stroke Mother    COPD Mother    Bladder Cancer Father    Melanoma Father    Heart attack Father    Diabetes Maternal Grandmother     Social History   Socioeconomic History   Marital status: Married    Spouse name: Not on file   Number of children: 3   Years of education: Not on file   Highest education level: Not on file  Occupational History   Not on  file  Tobacco Use   Smoking status: Never   Smokeless tobacco: Never  Vaping Use   Vaping Use: Never used  Substance and Sexual Activity   Alcohol use: Not Currently   Drug use: Never   Sexual activity: Yes    Birth control/protection: Surgical  Other Topics Concern   Not on file  Social History Narrative   Lives with husband and 2 of her children   Right handed   Caffeine: 1 cup of coffee in the morning and occasionally sweet tea   Social Determinants of Health   Financial Resource Strain: Not on file  Food Insecurity: Not on file  Transportation Needs: Not on file  Physical Activity: Not on file  Stress: Not on file  Social Connections: Not on file  Intimate Partner Violence: Not on file    Review of Systems  All other systems reviewed and are negative.  PHYSICAL EXAMINATION:    BP 110/64   Pulse 66   Ht 5\' 4"  (1.626 m)   Wt 153 lb 6.4 oz (69.6 kg)   SpO2 100%   BMI 26.33 kg/m     General appearance: alert, cooperative and appears stated age  64. Age-related osteoporosis without current pathological fracture Discussed calcium/vit d/exercise Avoid activities that can increase your risk of fracture Discussed treatment, no contraindications. Recent normal CBC, normal BMP other than elevated calcium Normal vit d in 2/22, on supplements - PTH, Intact and Calcium - Magnesium - Phosphorus - alendronate (FOSAMAX) 70 MG tablet; Take 1 tablet (70 mg total) by mouth every 7 (seven) days. Take first thing in am with 6 oz. Water.  Be upright after taking.  Eat nothing for one hour.  Dispense: 12 tablet; Refill: 3  2. Serum calcium elevated Recent calcium was mildly elevated - PTH, Intact and Calcium

## 2021-04-12 LAB — PHOSPHORUS: Phosphorus: 3 mg/dL (ref 2.5–4.5)

## 2021-04-12 LAB — PTH, INTACT AND CALCIUM
Calcium: 9.7 mg/dL (ref 8.6–10.4)
PTH: 25 pg/mL (ref 16–77)

## 2021-04-12 LAB — MAGNESIUM: Magnesium: 1.9 mg/dL (ref 1.5–2.5)

## 2021-04-17 ENCOUNTER — Other Ambulatory Visit: Payer: Self-pay | Admitting: Family Medicine

## 2021-04-21 DIAGNOSIS — M25511 Pain in right shoulder: Secondary | ICD-10-CM | POA: Diagnosis not present

## 2021-04-21 DIAGNOSIS — M25611 Stiffness of right shoulder, not elsewhere classified: Secondary | ICD-10-CM | POA: Diagnosis not present

## 2021-04-21 DIAGNOSIS — M25531 Pain in right wrist: Secondary | ICD-10-CM | POA: Diagnosis not present

## 2021-04-21 DIAGNOSIS — M25541 Pain in joints of right hand: Secondary | ICD-10-CM | POA: Diagnosis not present

## 2021-04-24 DIAGNOSIS — M25541 Pain in joints of right hand: Secondary | ICD-10-CM | POA: Diagnosis not present

## 2021-04-24 DIAGNOSIS — M25531 Pain in right wrist: Secondary | ICD-10-CM | POA: Diagnosis not present

## 2021-04-24 DIAGNOSIS — M25511 Pain in right shoulder: Secondary | ICD-10-CM | POA: Diagnosis not present

## 2021-04-24 DIAGNOSIS — M25611 Stiffness of right shoulder, not elsewhere classified: Secondary | ICD-10-CM | POA: Diagnosis not present

## 2021-04-26 ENCOUNTER — Ambulatory Visit (AMBULATORY_SURGERY_CENTER): Payer: BC Managed Care – PPO | Admitting: *Deleted

## 2021-04-26 ENCOUNTER — Other Ambulatory Visit: Payer: Self-pay

## 2021-04-26 ENCOUNTER — Encounter: Payer: Self-pay | Admitting: Gastroenterology

## 2021-04-26 VITALS — Ht 63.0 in | Wt 148.0 lb

## 2021-04-26 DIAGNOSIS — Z1211 Encounter for screening for malignant neoplasm of colon: Secondary | ICD-10-CM

## 2021-04-26 MED ORDER — SUTAB 1479-225-188 MG PO TABS: 1.0000 | ORAL_TABLET | Freq: Once | ORAL | 0 refills | Status: AC

## 2021-04-26 NOTE — Progress Notes (Signed)
Virtual pre visit completed over telephone. Instructions forwarded through MyChart and sent through secure e-mail to [email or web address] Sutab coupon mailed to patient    No egg or soy allergy known to patient  No issues known to pt with past sedation with any surgeries or procedures Patient denies ever being told they had issues or difficulty with intubation  No FH of Malignant Hyperthermia Pt is not on diet pills Pt is not on  home 02  Pt is not on blood thinners  Pt denies issues with constipation  No A fib or A flutter  Pt is fully vaccinated  for Covid   Code to Pharmacy and  NO PA's for preps discussed with pt In [place] today  Discussed with pt there will be an out-of-pocket cost for prep and that varies from $0 to 70 +  dollars - pt verbalized understanding   Due to the COVID-19 pandemic we are asking patients to follow certain guidelines in PV and the [place]   Pt aware of COVID protocols and [facility] guidelines

## 2021-05-01 DIAGNOSIS — M25541 Pain in joints of right hand: Secondary | ICD-10-CM | POA: Diagnosis not present

## 2021-05-01 DIAGNOSIS — M25531 Pain in right wrist: Secondary | ICD-10-CM | POA: Diagnosis not present

## 2021-05-01 DIAGNOSIS — M25511 Pain in right shoulder: Secondary | ICD-10-CM | POA: Diagnosis not present

## 2021-05-01 DIAGNOSIS — M25611 Stiffness of right shoulder, not elsewhere classified: Secondary | ICD-10-CM | POA: Diagnosis not present

## 2021-05-03 DIAGNOSIS — F411 Generalized anxiety disorder: Secondary | ICD-10-CM | POA: Diagnosis not present

## 2021-05-04 ENCOUNTER — Other Ambulatory Visit: Payer: Self-pay | Admitting: Internal Medicine

## 2021-05-04 ENCOUNTER — Other Ambulatory Visit: Payer: Self-pay | Admitting: Orthopedic Surgery

## 2021-05-04 DIAGNOSIS — M25561 Pain in right knee: Secondary | ICD-10-CM

## 2021-05-08 DIAGNOSIS — M25511 Pain in right shoulder: Secondary | ICD-10-CM | POA: Diagnosis not present

## 2021-05-08 DIAGNOSIS — M25541 Pain in joints of right hand: Secondary | ICD-10-CM | POA: Diagnosis not present

## 2021-05-08 DIAGNOSIS — M25611 Stiffness of right shoulder, not elsewhere classified: Secondary | ICD-10-CM | POA: Diagnosis not present

## 2021-05-08 DIAGNOSIS — M25531 Pain in right wrist: Secondary | ICD-10-CM | POA: Diagnosis not present

## 2021-05-09 ENCOUNTER — Encounter: Payer: Self-pay | Admitting: Certified Registered Nurse Anesthetist

## 2021-05-09 DIAGNOSIS — T8859XA Other complications of anesthesia, initial encounter: Secondary | ICD-10-CM | POA: Insufficient documentation

## 2021-05-10 ENCOUNTER — Encounter: Payer: Self-pay | Admitting: Gastroenterology

## 2021-05-10 ENCOUNTER — Ambulatory Visit (AMBULATORY_SURGERY_CENTER): Payer: BC Managed Care – PPO | Admitting: Gastroenterology

## 2021-05-10 ENCOUNTER — Other Ambulatory Visit: Payer: Self-pay

## 2021-05-10 VITALS — BP 122/64 | HR 59 | Temp 97.3°F | Resp 15 | Ht 63.0 in | Wt 148.0 lb

## 2021-05-10 DIAGNOSIS — D128 Benign neoplasm of rectum: Secondary | ICD-10-CM | POA: Diagnosis not present

## 2021-05-10 DIAGNOSIS — D129 Benign neoplasm of anus and anal canal: Secondary | ICD-10-CM

## 2021-05-10 DIAGNOSIS — Z1211 Encounter for screening for malignant neoplasm of colon: Secondary | ICD-10-CM

## 2021-05-10 MED ORDER — SODIUM CHLORIDE 0.9 % IV SOLN: 500.0000 mL | Freq: Once | INTRAVENOUS | Status: DC

## 2021-05-10 NOTE — Patient Instructions (Signed)
Handout on polyps given to patient. Await pathology results. Repeat colonoscopy for surveillance will be determined based off of pathology results.   YOU HAD AN ENDOSCOPIC PROCEDURE TODAY AT [facility]:   Refer to the procedure report that was given to you for any specific questions about what was found during the examination.  If the procedure report does not answer your questions, please call your gastroenterologist to clarify.  If you requested that your care partner not be given the details of your procedure findings, then the procedure report has been included in a sealed envelope for you to review at your convenience later.  YOU SHOULD EXPECT: Some feelings of bloating in the abdomen. Passage of more gas than usual.  Walking can help get rid of the air that was put into your GI tract during the procedure and reduce the bloating. If you had a lower endoscopy (such as a colonoscopy or flexible sigmoidoscopy) you may notice spotting of blood in your stool or on the toilet paper. If you underwent a bowel prep for your procedure, you may not have a normal bowel movement for a few days.  Please Note:  You might notice some irritation and congestion in your nose or some drainage.  This is from the oxygen used during your procedure.  There is no need for concern and it should clear up in a day or so.  SYMPTOMS TO REPORT IMMEDIATELY:  Following lower endoscopy (colonoscopy or flexible sigmoidoscopy):  Excessive amounts of blood in the stool  Significant tenderness or worsening of abdominal pains  Swelling of the abdomen that is new, acute  Fever of 100F or higher  For urgent or emergent issues, a gastroenterologist can be reached at any hour by calling [phone]. Do not use MyChart messaging for urgent concerns.    DIET:  We do recommend a small meal at first, but then you may proceed to your regular diet.  Drink plenty of fluids but you should avoid alcoholic beverages for  24 hours.  ACTIVITY:  You should plan to take it easy for the rest of today and you should NOT DRIVE or use heavy machinery until tomorrow (because of the sedation medicines used during the test).    FOLLOW UP: Our staff will call the number listed on your records 48-72 hours following your procedure to check on you and address any questions or concerns that you may have regarding the information given to you following your procedure. If we do not reach you, we will leave a message.  We will attempt to reach you two times.  During this call, we will ask if you have developed any symptoms of COVID 19. If you develop any symptoms (ie: fever, flu-like symptoms, shortness of breath, cough etc.) before then, please call [phone].  If you test positive for Covid 19 in the 2 weeks post procedure, please call and report this information to [place].    If any biopsies were taken you will be contacted by phone or by letter within the next 1-3 weeks.  Please call us at [phone] if you have not heard about the biopsies in 3 weeks.    SIGNATURES/CONFIDENTIALITY: You and/or your care partner have signed paperwork which will be entered into your electronic medical record.  These signatures attest to the fact that that the information above on your After Visit Summary has been reviewed and is understood.  Full responsibility of the confidentiality of this discharge information lies with you  and/or your care-partner.

## 2021-05-10 NOTE — Op Note (Signed)
[facility] Patient Name: Christine Nash Procedure Date: 05/10/2021 11:14 AM MRN: [id number] Endoscopist: Thornton Park MD, MD Age: 50 Referring MD:  Date of Birth: April 23, 1971 Gender: Female Account #: [id number] Procedure:                Colonoscopy Indications:              Screening for colorectal malignant neoplasm                           Prior colonoscopy in [place] 10 years ago                           No known family history of colon cancer or polyps Medicines:                Monitored Anesthesia Care Procedure:                Pre-Anesthesia Assessment:                           - Prior to the procedure, a History and Physical                            was performed, and patient medications and                            allergies were reviewed. The patient's tolerance of                            previous anesthesia was also reviewed. The risks                            and benefits of the procedure and the sedation                            options and risks were discussed with the patient.                            All questions were answered, and informed consent                            was obtained. Prior Anticoagulants: The patient has                            taken no previous anticoagulant or antiplatelet                            agents. ASA Grade Assessment: III - A patient with                            severe systemic disease. After reviewing the risks                            and benefits, the patient was deemed in  satisfactory condition to undergo the procedure.                           After obtaining informed consent, the colonoscope                            was passed under direct vision. Throughout the                            procedure, the patient's blood pressure, pulse, and                            oxygen saturations were monitored continuously. The                            Colonoscope  was introduced through the anus and                            advanced to the 3 cm into the ileum. A second                            forward view of the right colon was performed. The                            colonoscopy was performed without difficulty. The                            patient tolerated the procedure well. The quality                            of the bowel preparation was good. The terminal                            ileum, ileocecal valve, appendiceal orifice, and                            rectum were photographed. Scope In: 11:25:07 AM Scope Out: 11:38:30 AM Scope Withdrawal Time: 0 hours 10 minutes 49 seconds  Total Procedure Duration: 0 hours 13 minutes 23 seconds  Findings:                 The perianal and digital rectal examinations were                            normal.                           A 2 mm polyp was found in the rectum. The polyp was                            sessile. The polyp was removed with a cold snare.                            Resection and retrieval were complete. Estimated  blood loss was minimal.                           The exam was otherwise without abnormality on                            direct and retroflexion views. Complications:            No immediate complications. Estimated blood loss:                            Minimal. Estimated Blood Loss:     Estimated blood loss was minimal. Impression:               - One 2 mm polyp in the rectum, removed with a cold                            snare. Resected and retrieved.                           - The examination was otherwise normal on direct                            and retroflexion views. Recommendation:           - Patient has a contact number available for                            emergencies. The signs and symptoms of potential                            delayed complications were discussed with the                            patient.  Return to normal activities tomorrow.                            Written discharge instructions were provided to the                            patient.                           - Resume previous diet.                           - Continue present medications.                           - Await pathology results.                           - Repeat colonoscopy date to be determined after                            pending pathology results are reviewed for  surveillance.                           - Emerging evidence supports eating a diet of                            fruits, vegetables, grains, calcium, and yogurt                            while reducing red meat and alcohol may reduce the                            risk of colon cancer.                           - Thank you for allowing me to be involved in your                            colon cancer prevention. Thornton Park MD, MD 05/10/2021 11:42:43 AM This report has been signed electronically.

## 2021-05-10 NOTE — Progress Notes (Signed)
Report given to PACU, vss 

## 2021-05-10 NOTE — Progress Notes (Signed)
Called to room to assist during endoscopic procedure.  Patient ID and intended procedure confirmed with present staff. Received instructions for my participation in the procedure from the performing physician.  

## 2021-05-10 NOTE — Progress Notes (Signed)
Referring Provider: Leamon Arnt, MD Primary Care Physician:  Leamon Arnt, MD  Reason for Procedure:  Colon cancer screening   IMPRESSION:  Need for colon cancer screening Appropriate candidate for monitored anesthesia care  PLAN: Colonoscopy in the [facility] today   HPI: Christine Nash is a 50 y.o. female presents for screening colonoscopy.  Prior colonoscopy in [place] 10 years ago when she was having GI symptoms. She remembers the exam being normal except for some inflammation that was attributed to medications.   No baseline GI symptoms.   No known family history of colon cancer or polyps. No family history of uterine/endometrial cancer, pancreatic cancer or gastric/stomach cancer.   Past Medical History:  Diagnosis Date   Acute torn meniscus of knee, unspecified laterality, initial encounter    Anemia    Anxiety    Complication of anesthesia    Depression    Endometriosis    Fracture of right ulnar styloid    Hyperlipidemia    Osteoporosis    PONV (postoperative nausea and vomiting)     Past Surgical History:  Procedure Laterality Date   BUNIONECTOMY     2008   CERVICAL SPINE SURGERY  2011,2012,2017   ACDF repair w/instrumentation   CESAREAN SECTION  [id number]   CHOLECYSTECTOMY N/A 01/17/2019   Procedure: LAPAROSCOPIC CHOLECYSTECTOMY WITH INTRAOPERATIVE CHOLANGIOGRAM;  Surgeon: Alphonsa Overall, MD;  Location: [facility];  Service: General;  Laterality: N/A;   KNEE SURGERY     left knee   LAPAROSCOPIC SUPRACERVICAL HYSTERECTOMY     with BSO, at age 62   PERCUTANEOUS PINNING Right 08/18/2020   Procedure: Closed reduction and percutaneous pinning  of right ulnar fracture;  Surgeon: Cindra Presume, MD;  Location: [facility];  Service: Plastics;  Laterality: Right;  60 min   SHOULDER SURGERY     1996   SUPRACERVICAL ABDOMINAL HYSTERECTOMY  2013   TUBAL LIGATION  2011    Current Outpatient Medications  Medication Sig Dispense Refill    alendronate (FOSAMAX) 70 MG tablet Take 1 tablet (70 mg total) by mouth every 7 (seven) days. Take first thing in am with 6 oz. Water.  Be upright after taking.  Eat nothing for one hour. 12 tablet 3   baclofen (LIORESAL) 20 MG tablet Take 1 tablet (20 mg total) by mouth at bedtime. 90 tablet 1   cholecalciferol (VITAMIN D) 25 MCG (1000 UT) tablet Take 4,000 Units by mouth daily.     Estradiol 10 MCG TABS vaginal tablet Place one tablet vaginally 3 x a week 36 tablet 4   ezetimibe (ZETIA) 10 MG tablet Take 1 tablet (10 mg total) by mouth daily. 90 tablet 3   rosuvastatin (CRESTOR) 20 MG tablet Take 1 tablet (20 mg total) by mouth daily. 90 tablet 3   acetaminophen (TYLENOL) 500 MG tablet Take 1,000 mg by mouth every 6 (six) hours as needed for moderate pain or headache.     amoxicillin (AMOXIL) 500 MG tablet Take by mouth.     busPIRone (BUSPAR) 7.5 MG tablet TAKE 1 TABLET BY MOUTH AS NEEDED (Patient not taking: Reported on 05/10/2021) 30 tablet 0   ibuprofen (ADVIL) 200 MG tablet Take 800 mg by mouth every 6 (six) hours as needed for mild pain or headache. (Patient not taking: Reported on 05/10/2021)     ibuprofen (ADVIL) 800 MG tablet Take 800 mg by mouth every 6 (six) hours as needed.     Current Facility-Administered Medications  Medication  Dose Route Frequency Provider Last Rate Last Admin   0.9 %  sodium chloride infusion  500 mL Intravenous Once Thornton Park, MD        Allergies as of 05/10/2021 - Review Complete 05/10/2021  Allergen Reaction Noted   Meperidine hcl Hives 06/27/2011   Prochlorperazine edisylate  06/27/2011   Propranolol  10/09/2018   Trokendi xr [topiramate er] Other (See Comments) 10/09/2018   Phentermine Palpitations 10/09/2018    Family History  Problem Relation Age of Onset   Stroke Mother    COPD Mother    Bladder Cancer Father    Melanoma Father    Heart attack Father    Breast cancer Maternal Aunt    Diabetes Maternal Grandmother    Colon polyps Neg  Hx    Colon cancer Neg Hx    Esophageal cancer Neg Hx    Rectal cancer Neg Hx    Stomach cancer Neg Hx      Physical Exam: General:   Alert,  well-nourished, pleasant and cooperative in NAD Head:  Normocephalic and atraumatic. Eyes:  Sclera clear, no icterus.   Conjunctiva pink. Mouth:  No deformity or lesions.   Neck:  Supple; no masses or thyromegaly. Lungs:  Clear throughout to auscultation.   No wheezes. Heart:  Regular rate and rhythm; no murmurs. Abdomen:  Soft, non-tender, nondistended, normal bowel sounds, no rebound or guarding.  Msk:  Symmetrical. No boney deformities LAD: No inguinal or umbilical LAD Extremities:  No clubbing or edema. Neurologic:  Alert and  oriented x4;  grossly nonfocal Skin:  No obvious rash or bruise. Psych:  Alert and cooperative. Normal mood and affect.     Kingsley Herandez L. Tarri Glenn, MD, MPH 05/10/2021, 11:15 AM

## 2021-05-12 ENCOUNTER — Telehealth: Payer: Self-pay

## 2021-05-12 ENCOUNTER — Telehealth: Payer: Self-pay | Admitting: *Deleted

## 2021-05-12 NOTE — Telephone Encounter (Signed)
Message left

## 2021-05-12 NOTE — Telephone Encounter (Signed)
  Follow up Call-  Call back number 05/10/2021  Post procedure Call Back phone  # [phone]  Permission to leave phone message Yes  Some recent data might be hidden     Patient questions:  Do you have a fever, pain , or abdominal swelling? No. Pain Score  0 *  Have you tolerated food without any problems? Yes.    Have you been able to return to your normal activities? Yes.    Do you have any questions about your discharge instructions: Diet   No. Medications  No. Follow up visit  No.  Do you have questions or concerns about your Care? No.  Actions: * If pain score is 4 or above: No action needed, pain <4.  Have you developed a fever since your procedure? no  2.   Have you had an respiratory symptoms (SOB or cough) since your procedure? no  3.   Have you tested positive for COVID 19 since your procedure no  4.   Have you had any family members/close contacts diagnosed with the COVID 19 since your procedure?  no   If yes to any of these questions please route to Joylene John, RN and Joella Prince, RN

## 2021-05-14 ENCOUNTER — Other Ambulatory Visit: Payer: Self-pay

## 2021-05-14 ENCOUNTER — Ambulatory Visit
Admission: RE | Admit: 2021-05-14 | Discharge: 2021-05-14 | Disposition: A | Discharge status: Home / Self Care | Payer: BC Managed Care – PPO | Source: Ambulatory Visit | Attending: Internal Medicine | Admitting: Internal Medicine

## 2021-05-14 ENCOUNTER — Encounter: Payer: Self-pay | Admitting: Gastroenterology

## 2021-05-14 DIAGNOSIS — M25561 Pain in right knee: Secondary | ICD-10-CM

## 2021-05-15 DIAGNOSIS — M25541 Pain in joints of right hand: Secondary | ICD-10-CM | POA: Diagnosis not present

## 2021-05-15 DIAGNOSIS — M25531 Pain in right wrist: Secondary | ICD-10-CM | POA: Diagnosis not present

## 2021-05-15 DIAGNOSIS — M25611 Stiffness of right shoulder, not elsewhere classified: Secondary | ICD-10-CM | POA: Diagnosis not present

## 2021-05-15 DIAGNOSIS — M25511 Pain in right shoulder: Secondary | ICD-10-CM | POA: Diagnosis not present

## 2021-05-16 DIAGNOSIS — F411 Generalized anxiety disorder: Secondary | ICD-10-CM | POA: Diagnosis not present

## 2021-05-17 DIAGNOSIS — M25511 Pain in right shoulder: Secondary | ICD-10-CM | POA: Diagnosis not present

## 2021-05-17 DIAGNOSIS — M25531 Pain in right wrist: Secondary | ICD-10-CM | POA: Diagnosis not present

## 2021-05-17 DIAGNOSIS — M25611 Stiffness of right shoulder, not elsewhere classified: Secondary | ICD-10-CM | POA: Diagnosis not present

## 2021-05-17 DIAGNOSIS — M25541 Pain in joints of right hand: Secondary | ICD-10-CM | POA: Diagnosis not present

## 2021-05-22 DIAGNOSIS — M25531 Pain in right wrist: Secondary | ICD-10-CM | POA: Diagnosis not present

## 2021-05-22 DIAGNOSIS — M25541 Pain in joints of right hand: Secondary | ICD-10-CM | POA: Diagnosis not present

## 2021-05-22 DIAGNOSIS — M25561 Pain in right knee: Secondary | ICD-10-CM | POA: Diagnosis not present

## 2021-05-22 DIAGNOSIS — M25511 Pain in right shoulder: Secondary | ICD-10-CM | POA: Diagnosis not present

## 2021-05-22 DIAGNOSIS — M25611 Stiffness of right shoulder, not elsewhere classified: Secondary | ICD-10-CM | POA: Diagnosis not present

## 2021-05-23 ENCOUNTER — Encounter: Payer: Self-pay | Admitting: Family Medicine

## 2021-05-23 ENCOUNTER — Other Ambulatory Visit: Payer: Self-pay

## 2021-05-23 ENCOUNTER — Ambulatory Visit (INDEPENDENT_AMBULATORY_CARE_PROVIDER_SITE_OTHER): Payer: BC Managed Care – PPO | Admitting: Family Medicine

## 2021-05-23 VITALS — BP 123/78 | HR 72 | Temp 98.3°F | Ht 63.0 in | Wt 155.2 lb

## 2021-05-23 DIAGNOSIS — G4486 Cervicogenic headache: Secondary | ICD-10-CM | POA: Diagnosis not present

## 2021-05-23 DIAGNOSIS — G500 Trigeminal neuralgia: Secondary | ICD-10-CM | POA: Insufficient documentation

## 2021-05-23 DIAGNOSIS — R519 Headache, unspecified: Secondary | ICD-10-CM | POA: Insufficient documentation

## 2021-05-23 MED ORDER — VERAPAMIL HCL ER 120 MG PO TBCR: 120.0000 mg | EXTENDED_RELEASE_TABLET | Freq: Every day | ORAL | 3 refills | Status: DC

## 2021-05-23 MED ORDER — PREDNISONE 50 MG PO TABS: ORAL_TABLET | ORAL | 0 refills | Status: DC

## 2021-05-23 NOTE — Progress Notes (Signed)
   Christine Nash is a 50 y.o. female who presents today for an office visit.  Assessment/Plan:  Chronic Problems Addressed Today: Trigeminal neuralgia Not controlled with acute flare.  Discussed treatment options.  She has had bad reactions to antiepileptic meds in the past.  We will start verapamil as this has worked well for her.  We will also try prednisone burst.  She has not responded well triptans in the past.  She is concerned about possible dental abscess.  Do not think this is likely based on clinical presentation.  Does have a history of known abscess.  We will check CT maxillofacial to further evaluate.  She will check in with me in few days via MyChart.     Subjective:  HPI:  She complain of headache which started few days ago. She reports pain radiates to her left eye and into ear. She is experiencing facial pain as well. Associated symptoms are nasal congestion, fatigue and swelling around eyes. She rated pain 8 out 10 on a scale. She had dental work done in september which showed abscess in sinus cavity. She is concerned it might be related to this. She also felt feverish yesterday. She have a history of Trigeminal neuralgia. She had flare up for this few years ago. She denies numbness or weakness in legs or arms.  No nausea or vomiting.          Objective:  Physical Exam: BP 123/78   Pulse 72   Temp 98.3 F (36.8 C) (Temporal)   Ht 5\' 3"  (1.6 m)   Wt 155 lb 3.2 oz (70.4 kg)   SpO2 98%   BMI 27.49 kg/m   Gen: No acute distress, resting comfortably HEENT: TMs clear bilaterally.  OP clear. CV: Regular rate and rhythm with no murmurs appreciated Pulm: Normal work of breathing, clear to auscultation bilaterally with no crackles, wheezes, or rhonchi Neuro: CN 2-12 intact.  Strength 5/5 in upper and lower extremities. Psych: Normal affect and thought content       I,Savera Zaman,acting as a scribe for Dimas Chyle, MD.,have documented all relevant  documentation on the behalf of Dimas Chyle, MD,as directed by  Dimas Chyle, MD while in the presence of Dimas Chyle, MD.   I, Dimas Chyle, MD, have reviewed all documentation for this visit. The documentation on 05/23/21 for the exam, diagnosis, procedures, and orders are all accurate and complete.  Algis Greenhouse. Jerline Pain, MD 05/23/2021 2:09 PM

## 2021-05-23 NOTE — Assessment & Plan Note (Addendum)
Not controlled with acute flare.  Discussed treatment options.  She has had bad reactions to antiepileptic meds in the past.  We will start verapamil as this has worked well for her.  We will also try prednisone burst.  She has not responded well triptans in the past.  She is concerned about possible dental abscess.  Do not think this is likely based on clinical presentation.  Does have a history of known abscess.  We will check CT maxillofacial to further evaluate.  She will check in with me in few days via MyChart.

## 2021-05-26 DIAGNOSIS — M25511 Pain in right shoulder: Secondary | ICD-10-CM | POA: Diagnosis not present

## 2021-05-26 DIAGNOSIS — M25531 Pain in right wrist: Secondary | ICD-10-CM | POA: Diagnosis not present

## 2021-05-26 DIAGNOSIS — M25541 Pain in joints of right hand: Secondary | ICD-10-CM | POA: Diagnosis not present

## 2021-05-26 DIAGNOSIS — M25611 Stiffness of right shoulder, not elsewhere classified: Secondary | ICD-10-CM | POA: Diagnosis not present

## 2021-05-29 DIAGNOSIS — M25531 Pain in right wrist: Secondary | ICD-10-CM | POA: Diagnosis not present

## 2021-05-29 DIAGNOSIS — M25511 Pain in right shoulder: Secondary | ICD-10-CM | POA: Diagnosis not present

## 2021-05-29 DIAGNOSIS — M25541 Pain in joints of right hand: Secondary | ICD-10-CM | POA: Diagnosis not present

## 2021-05-29 DIAGNOSIS — M25611 Stiffness of right shoulder, not elsewhere classified: Secondary | ICD-10-CM | POA: Diagnosis not present

## 2021-05-29 DIAGNOSIS — M170 Bilateral primary osteoarthritis of knee: Secondary | ICD-10-CM | POA: Diagnosis not present

## 2021-05-31 DIAGNOSIS — F411 Generalized anxiety disorder: Secondary | ICD-10-CM | POA: Diagnosis not present

## 2021-06-05 DIAGNOSIS — M25511 Pain in right shoulder: Secondary | ICD-10-CM | POA: Diagnosis not present

## 2021-06-05 DIAGNOSIS — M25611 Stiffness of right shoulder, not elsewhere classified: Secondary | ICD-10-CM | POA: Diagnosis not present

## 2021-06-05 DIAGNOSIS — M25541 Pain in joints of right hand: Secondary | ICD-10-CM | POA: Diagnosis not present

## 2021-06-05 DIAGNOSIS — M25531 Pain in right wrist: Secondary | ICD-10-CM | POA: Diagnosis not present

## 2021-06-05 DIAGNOSIS — M170 Bilateral primary osteoarthritis of knee: Secondary | ICD-10-CM | POA: Diagnosis not present

## 2021-06-06 ENCOUNTER — Other Ambulatory Visit: Payer: BC Managed Care – PPO

## 2021-06-09 DIAGNOSIS — M25541 Pain in joints of right hand: Secondary | ICD-10-CM | POA: Diagnosis not present

## 2021-06-09 DIAGNOSIS — M25511 Pain in right shoulder: Secondary | ICD-10-CM | POA: Diagnosis not present

## 2021-06-09 DIAGNOSIS — M25531 Pain in right wrist: Secondary | ICD-10-CM | POA: Diagnosis not present

## 2021-06-09 DIAGNOSIS — M25611 Stiffness of right shoulder, not elsewhere classified: Secondary | ICD-10-CM | POA: Diagnosis not present

## 2021-06-12 ENCOUNTER — Ambulatory Visit
Admission: RE | Admit: 2021-06-12 | Discharge: 2021-06-12 | Disposition: A | Discharge status: Home / Self Care | Payer: BC Managed Care – PPO | Source: Ambulatory Visit | Attending: Family Medicine | Admitting: Family Medicine

## 2021-06-12 DIAGNOSIS — G500 Trigeminal neuralgia: Secondary | ICD-10-CM | POA: Diagnosis not present

## 2021-06-12 DIAGNOSIS — M25531 Pain in right wrist: Secondary | ICD-10-CM | POA: Diagnosis not present

## 2021-06-12 DIAGNOSIS — M25511 Pain in right shoulder: Secondary | ICD-10-CM | POA: Diagnosis not present

## 2021-06-12 DIAGNOSIS — M25541 Pain in joints of right hand: Secondary | ICD-10-CM | POA: Diagnosis not present

## 2021-06-12 DIAGNOSIS — M25611 Stiffness of right shoulder, not elsewhere classified: Secondary | ICD-10-CM | POA: Diagnosis not present

## 2021-06-12 MED ORDER — IOPAMIDOL (ISOVUE-300) INJECTION 61%
80.0000 mL | Freq: Once | INTRAVENOUS | Status: AC | PRN
  Administered 2021-06-12: 80 mL via INTRAVENOUS

## 2021-06-13 DIAGNOSIS — M170 Bilateral primary osteoarthritis of knee: Secondary | ICD-10-CM | POA: Diagnosis not present

## 2021-06-13 DIAGNOSIS — F411 Generalized anxiety disorder: Secondary | ICD-10-CM | POA: Diagnosis not present

## 2021-06-13 NOTE — Progress Notes (Signed)
Please inform patient of the following:  Her CT scan was normal. No signs of infection or abscess. Would like for her to let us know if her symptoms are not improving.  Algis Greenhouse. Jerline Pain, MD 06/13/2021 12:47 PM

## 2021-06-15 ENCOUNTER — Ambulatory Visit: Payer: BC Managed Care – PPO | Admitting: Plastic Surgery

## 2021-06-19 ENCOUNTER — Telehealth: Payer: Self-pay

## 2021-06-19 ENCOUNTER — Other Ambulatory Visit: Payer: Self-pay

## 2021-06-19 MED ORDER — BACLOFEN 20 MG PO TABS: 20.0000 mg | ORAL_TABLET | Freq: Every day | ORAL | 1 refills | Status: DC

## 2021-06-19 NOTE — Telephone Encounter (Signed)
Has been filled.

## 2021-06-19 NOTE — Telephone Encounter (Signed)
Please schedule TOC

## 2021-06-19 NOTE — Telephone Encounter (Signed)
  Encourage patient to contact the pharmacy for refills or they can request refills through [facility]:  05/23/21  NEXT APPOINTMENT DATE:  MEDICATION: baclofen (LIORESAL) 20 MG tablet  Is the patient out of medication? Yes   PHARMACY: [facility] [address], [place], [place] [address]  Let patient know to contact pharmacy at the end of the day to make sure medication is ready.  Please notify patient to allow 48-72 hours to process

## 2021-06-19 NOTE — Telephone Encounter (Signed)
[name] with me.  Christine Nash. Christine Pain, MD 06/19/2021 8:46 AM

## 2021-06-19 NOTE — Telephone Encounter (Signed)
Patient is scheduled   

## 2021-06-19 NOTE — Telephone Encounter (Signed)
Patient is calling in stating she talked with Dr.Parker in November about doing a TOC from [facility] to him. Didn't see any documentation, okay for transfer?

## 2021-06-20 ENCOUNTER — Other Ambulatory Visit: Payer: Self-pay

## 2021-06-20 DIAGNOSIS — N952 Postmenopausal atrophic vaginitis: Secondary | ICD-10-CM

## 2021-06-20 MED ORDER — ESTRADIOL 10 MCG VA TABS: 1.0000 | ORAL_TABLET | VAGINAL | 2 refills | Status: DC

## 2021-06-20 NOTE — Telephone Encounter (Signed)
Pt calling to report recent change of pharmacy from [facility] to [facility] [address]. Reports there was some sort of discrepancy w/ pharmacy and insurance after she tried to get Rx's transferred. Reports she only has enough of the vagifem to last her until 06/30/21. Pt is [facility] if possible. AEX not due until 02/2022. Mammo UTD. Please advise.

## 2021-06-22 ENCOUNTER — Ambulatory Visit: Payer: BC Managed Care – PPO | Admitting: Plastic Surgery

## 2021-06-28 DIAGNOSIS — F411 Generalized anxiety disorder: Secondary | ICD-10-CM | POA: Diagnosis not present

## 2021-07-05 NOTE — Progress Notes (Signed)
This encounter was created in error - please disregard.

## 2021-08-11 ENCOUNTER — Other Ambulatory Visit: Payer: Self-pay | Admitting: Internal Medicine

## 2021-08-11 DIAGNOSIS — Z01812 Encounter for preprocedural laboratory examination: Secondary | ICD-10-CM

## 2021-08-11 DIAGNOSIS — Z79899 Other long term (current) drug therapy: Secondary | ICD-10-CM

## 2021-08-21 ENCOUNTER — Other Ambulatory Visit: Payer: Self-pay

## 2021-08-21 ENCOUNTER — Ambulatory Visit (INDEPENDENT_AMBULATORY_CARE_PROVIDER_SITE_OTHER): Payer: 59 | Admitting: Family Medicine

## 2021-08-21 VITALS — BP 114/74 | HR 66 | Temp 98.3°F | Ht 63.0 in | Wt 156.0 lb

## 2021-08-21 DIAGNOSIS — G4486 Cervicogenic headache: Secondary | ICD-10-CM

## 2021-08-21 DIAGNOSIS — E7849 Other hyperlipidemia: Secondary | ICD-10-CM

## 2021-08-21 DIAGNOSIS — F411 Generalized anxiety disorder: Secondary | ICD-10-CM

## 2021-08-21 DIAGNOSIS — E559 Vitamin D deficiency, unspecified: Secondary | ICD-10-CM | POA: Diagnosis not present

## 2021-08-21 DIAGNOSIS — Z6827 Body mass index (BMI) 27.0-27.9, adult: Secondary | ICD-10-CM | POA: Diagnosis not present

## 2021-08-21 DIAGNOSIS — I2583 Coronary atherosclerosis due to lipid rich plaque: Secondary | ICD-10-CM | POA: Diagnosis not present

## 2021-08-21 DIAGNOSIS — Z0001 Encounter for general adult medical examination with abnormal findings: Secondary | ICD-10-CM

## 2021-08-21 DIAGNOSIS — R519 Headache, unspecified: Secondary | ICD-10-CM | POA: Diagnosis not present

## 2021-08-21 DIAGNOSIS — I2510 Atherosclerotic heart disease of native coronary artery without angina pectoris: Secondary | ICD-10-CM | POA: Diagnosis not present

## 2021-08-21 DIAGNOSIS — R739 Hyperglycemia, unspecified: Secondary | ICD-10-CM | POA: Diagnosis not present

## 2021-08-21 DIAGNOSIS — R69 Illness, unspecified: Secondary | ICD-10-CM | POA: Diagnosis not present

## 2021-08-21 LAB — TSH: TSH: 1.56 u[IU]/mL (ref 0.35–5.50)

## 2021-08-21 LAB — CBC
HCT: 36.5 % (ref 36.0–46.0)
Hemoglobin: 12.5 g/dL (ref 12.0–15.0)
MCHC: 34.3 g/dL (ref 30.0–36.0)
MCV: 88.1 fl (ref 78.0–100.0)
Platelets: 143 10*3/uL — ABNORMAL LOW (ref 150.0–400.0)
RBC: 4.14 Mil/uL (ref 3.87–5.11)
RDW: 12.9 % (ref 11.5–15.5)
WBC: 3.8 10*3/uL — ABNORMAL LOW (ref 4.0–10.5)

## 2021-08-21 LAB — VITAMIN D 25 HYDROXY (VIT D DEFICIENCY, FRACTURES): VITD: 71.92 ng/mL (ref 30.00–100.00)

## 2021-08-21 LAB — HEMOGLOBIN A1C: Hgb A1c MFr Bld: 5.6 % (ref 4.6–6.5)

## 2021-08-21 MED ORDER — BACLOFEN 20 MG PO TABS: 20.0000 mg | ORAL_TABLET | Freq: Every day | ORAL | 1 refills | Status: DC

## 2021-08-21 NOTE — Assessment & Plan Note (Signed)
Stable.  Refill baclofen.

## 2021-08-21 NOTE — Assessment & Plan Note (Signed)
Check Vit D 

## 2021-08-21 NOTE — Patient Instructions (Addendum)
It was very nice to see you today!  We will check blood work.  Keep up the good work!  We will see you back in a year for your annual check up. Come back sooner if needed.   Take care, Dr Jerline Pain  PLEASE NOTE:  If you had any lab tests please let us know if you have not heard back within a few days. You may see your results on mychart before we have a chance to review them but we will give you a call once they are reviewed by [place]. If we ordered any referrals today, please let us know if you have not heard from their office within the next week.   Please try these tips to maintain a healthy lifestyle:  Eat at least 3 REAL meals and 1-2 snacks per day.  Aim for no more than 5 hours between eating.  If you eat breakfast, please do so within one hour of getting up.   Each meal should contain half fruits/vegetables, one quarter protein, and one quarter carbs (no bigger than a computer mouse)  Cut down on sweet beverages. This includes juice, soda, and sweet tea.   Drink at least 1 glass of water with each meal and aim for at least 8 glasses per day  Exercise at least 150 minutes every week.    Preventive Care 52-12 Years Old, Female Preventive care refers to lifestyle choices and visits with your health care provider that can promote health and wellness. Preventive care visits are also called wellness exams. What can I expect for my preventive care visit? Counseling Your health care provider may ask you questions about your: Medical history, including: Past medical problems. Family medical history. Pregnancy history. Current health, including: Menstrual cycle. Method of birth control. Emotional well-being. Home life and relationship well-being. Sexual activity and sexual health. Lifestyle, including: Alcohol, nicotine or tobacco, and drug use. Access to firearms. Diet, exercise, and sleep habits. Work and work Statistician. Sunscreen use. Safety issues such as seatbelt and  bike helmet use. Physical exam Your health care provider will check your: Height and weight. These may be used to calculate your BMI (body mass index). BMI is a measurement that tells if you are at a healthy weight. Waist circumference. This measures the distance around your waistline. This measurement also tells if you are at a healthy weight and may help predict your risk of certain diseases, such as type 2 diabetes and high blood pressure. Heart rate and blood pressure. Body temperature. Skin for abnormal spots. What immunizations do I need? Vaccines are usually given at various ages, according to a schedule. Your health care provider will recommend vaccines for you based on your age, medical history, and lifestyle or other factors, such as travel or where you work. What tests do I need? Screening Your health care provider may recommend screening tests for certain conditions. This may include: Lipid and cholesterol levels. Diabetes screening. This is done by checking your blood sugar (glucose) after you have not eaten for a while (fasting). Pelvic exam and Pap test. Hepatitis B test. Hepatitis C test. HIV (human immunodeficiency virus) test. STI (sexually transmitted infection) testing, if you are at risk. Lung cancer screening. Colorectal cancer screening. Mammogram. Talk with your health care provider about when you should start having regular mammograms. This may depend on whether you have a family history of breast cancer. BRCA-related cancer screening. This may be done if you have a family history of breast, ovarian, tubal, or  peritoneal cancers. Bone density scan. This is done to screen for osteoporosis. Talk with your health care provider about your test results, treatment options, and if necessary, the need for more tests. Follow these instructions at home: Eating and drinking  Eat a diet that includes fresh fruits and vegetables, whole grains, lean protein, and low-fat dairy  products. Take vitamin and mineral supplements as recommended by your health care provider. Do not drink alcohol if: Your health care provider tells you not to drink. You are pregnant, may be pregnant, or are planning to become pregnant. If you drink alcohol: Limit how much you have to 0-1 drink a day. Know how much alcohol is in your drink. In the [place] one drink equals one 12 oz bottle of beer (355 mL), one 5 oz glass of wine (148 mL), or one 1 oz glass of hard liquor (44 mL). Lifestyle Brush your teeth every morning and night with fluoride toothpaste. Floss one time each day. Exercise for at least 30 minutes 5 or more days each week. Do not use any products that contain nicotine or tobacco. These products include cigarettes, chewing tobacco, and vaping devices, such as e-cigarettes. If you need help quitting, ask your health care provider. Do not use drugs. If you are sexually active, practice safe sex. Use a condom or other form of protection to prevent STIs. If you do not wish to become pregnant, use a form of birth control. If you plan to become pregnant, see your health care provider for a prepregnancy visit. Take aspirin only as told by your health care provider. Make sure that you understand how much to take and what form to take. Work with your health care provider to find out whether it is safe and beneficial for you to take aspirin daily. Find healthy ways to manage stress, such as: Meditation, yoga, or listening to music. Journaling. Talking to a trusted person. Spending time with friends and family. Minimize exposure to UV radiation to reduce your risk of skin cancer. Safety Always wear your seat belt while driving or riding in a vehicle. Do not drive: If you have been drinking alcohol. Do not ride with someone who has been drinking. When you are tired or distracted. While texting. If you have been using any mind-altering substances or drugs. Wear a helmet and other  protective equipment during sports activities. If you have firearms in your house, make sure you follow all gun safety procedures. Seek help if you have been physically or sexually abused. What's next? Visit your health care provider once a year for an annual wellness visit. Ask your health care provider how often you should have your eyes and teeth checked. Stay up to date on all vaccines. This information is not intended to replace advice given to you by your health care provider. Make sure you discuss any questions you have with your health care provider. Document Revised: 12/21/2020 Document Reviewed: 12/21/2020 Elsevier Patient Education  [place].

## 2021-08-21 NOTE — Assessment & Plan Note (Signed)
Follows with cardiology.  LDL goal less than 70.  We will continue Crestor 20 mg daily and Zetia 10 mg daily.

## 2021-08-21 NOTE — Assessment & Plan Note (Signed)
Stable.  Symptoms currently manageable.  We discussed referral to neurology however she would like to defer for now.

## 2021-08-21 NOTE — Progress Notes (Signed)
Chief Complaint:  Christine Nash is a 51 y.o. female who presents today for her annual comprehensive physical exam.    Assessment/Plan:  Chronic Problems Addressed Today: Coronary artery disease Follows with cardiology.  LDL goal less than 70.  We will continue Crestor 20 mg daily and Zetia 10 mg daily.  Cervicogenic headache Stable.  Refill baclofen.  Vitamin D deficiency Check Vit D.   Familial hyperlipidemia Follows with cardiology. On crestor 20mg  daily and zetia 10mg  daily.   Chronic daily headache Stable.  Symptoms currently manageable.  We discussed referral to neurology however she would like to defer for now.   Body mass index is 27.63 kg/m. / Overweight  BMI Metric Follow Up - 08/21/21 0905       BMI Metric Follow Up-Please document annually   BMI Metric Follow Up Education provided              Preventative Healthcare: UTD on vaccines and screenings.  Patient Counseling(The following topics were reviewed and/or handout was given):  -Nutrition: Stressed importance of moderation in sodium/caffeine intake, saturated fat and cholesterol, caloric balance, sufficient intake of fresh fruits, vegetables, and fiber.  -Stressed the importance of regular exercise.   -Substance Abuse: Discussed cessation/primary prevention of tobacco, alcohol, or other drug use; driving or other dangerous activities under the influence; availability of treatment for abuse.   -Injury prevention: Discussed safety belts, safety helmets, smoke detector, smoking near bedding or upholstery.   -Sexuality: Discussed sexually transmitted diseases, partner selection, use of condoms, avoidance of unintended pregnancy and contraceptive alternatives.   -Dental health: Discussed importance of regular tooth brushing, flossing, and dental visits.  -Health maintenance and immunizations reviewed. Please refer to Health maintenance section.  Return to care in 1 year for next preventative visit.      Subjective:  HPI:  She has no acute complaints today.   Lifestyle Diet: Balanced. Plenty of fruits and vegetables.  Exercise: Tries to work on cardio routinely.   Depression screen [facility] 2/9 08/21/2021  Decreased Interest 0  Down, Depressed, Hopeless 0  PHQ - 2 Score 0  Altered sleeping -  Tired, decreased energy -  Change in appetite -  Feeling bad or failure about yourself  -  Trouble concentrating -  Moving slowly or fidgety/restless -  Suicidal thoughts -  PHQ-9 Score -  Difficult doing work/chores -    There are no preventive care reminders to display for this patient.   ROS: Per HPI, otherwise a complete review of systems was negative.   PMH:  The following were reviewed and entered/updated in epic: Past Medical History:  Diagnosis Date   Acute torn meniscus of knee, unspecified laterality, initial encounter    Anemia    Anxiety    Complication of anesthesia    Depression    Endometriosis    Fracture of right ulnar styloid    Hyperlipidemia    Osteoporosis    PONV (postoperative nausea and vomiting)    Patient Active Problem List   Diagnosis Date Noted   Coronary artery disease 08/21/2021   Chronic daily headache 89/38/1017   Complication of anesthesia 05/09/2021   Cervicogenic headache 10/18/2019   Generalized anxiety disorder 02/11/2018   Spinal stenosis of cervical region 02/11/2018   Familial hyperlipidemia 02/11/2018   Vitamin D deficiency 02/11/2018   Attention deficit hyperactivity disorder, predominantly inattentive type 06/27/2011   Past Surgical History:  Procedure Laterality Date   BUNIONECTOMY     2008   CERVICAL SPINE  SURGERY  2011,2012,2017   ACDF repair w/instrumentation   CESAREAN SECTION  0623,7628   CHOLECYSTECTOMY N/A 01/17/2019   Procedure: LAPAROSCOPIC CHOLECYSTECTOMY WITH INTRAOPERATIVE CHOLANGIOGRAM;  Surgeon: Alphonsa Overall, MD;  Location: [facility];  Service: General;  Laterality: N/A;   KNEE SURGERY     left knee    LAPAROSCOPIC SUPRACERVICAL HYSTERECTOMY     with BSO, at age 11   PERCUTANEOUS PINNING Right 08/18/2020   Procedure: Closed reduction and percutaneous pinning  of right ulnar fracture;  Surgeon: Cindra Presume, MD;  Location: [place];  Service: Plastics;  Laterality: Right;  60 min   Lane   SUPRACERVICAL ABDOMINAL HYSTERECTOMY  2013   TUBAL LIGATION  2011    Family History  Problem Relation Age of Onset   Stroke Mother    COPD Mother    Bladder Cancer Father    Melanoma Father    Heart attack Father    Breast cancer Maternal Aunt    Diabetes Maternal Grandmother    Colon polyps Neg Hx    Colon cancer Neg Hx    Esophageal cancer Neg Hx    Rectal cancer Neg Hx    Stomach cancer Neg Hx     Medications- reviewed and updated Current Outpatient Medications  Medication Sig Dispense Refill   alendronate (FOSAMAX) 70 MG tablet Take 1 tablet (70 mg total) by mouth every 7 (seven) days. Take first thing in am with 6 oz. Water.  Be upright after taking.  Eat nothing for one hour. 12 tablet 3   cholecalciferol (VITAMIN D) 25 MCG (1000 UT) tablet Take 4,000 Units by mouth daily.     Estradiol 10 MCG TABS vaginal tablet Place 1 tablet (10 mcg total) vaginally 3 (three) times a week. 36 tablet 2   ibuprofen (ADVIL) 200 MG tablet Take 800 mg by mouth every 6 (six) hours as needed for mild pain or headache.     Ubiquinol (QUNOL COQ10/UBIQUINOL/MEGA) 100 MG CAPS Take 5 mg by mouth at bedtime.     baclofen (LIORESAL) 20 MG tablet Take 1 tablet (20 mg total) by mouth at bedtime. 90 tablet 1   ezetimibe (ZETIA) 10 MG tablet Take 1 tablet (10 mg total) by mouth daily. 90 tablet 3   rosuvastatin (CRESTOR) 20 MG tablet Take 1 tablet (20 mg total) by mouth daily. 90 tablet 3   No current facility-administered medications for this visit.    Allergies-reviewed and updated Allergies  Allergen Reactions   Meperidine Hcl Hives   Prochlorperazine Edisylate      Other reaction(s): agitiation   Propranolol     "brain fog" fatigue    Trokendi Kellogg Er] Other (See Comments)    "brain fog"    Phentermine Palpitations    Chest pain / palpitations     Social History   Socioeconomic History   Marital status: Married    Spouse name: Not on file   Number of children: 3   Years of education: Not on file   Highest education level: Not on file  Occupational History   Not on file  Tobacco Use   Smoking status: Never   Smokeless tobacco: Never  Vaping Use   Vaping Use: Never used  Substance and Sexual Activity   Alcohol use: Not Currently   Drug use: Never   Sexual activity: Yes    Birth control/protection: Surgical  Other Topics Concern   Not on file  Social History  Narrative   Lives with husband and 2 of her children   Right handed   Caffeine: 1 cup of coffee in the morning and occasionally sweet tea   Social Determinants of Health   Financial Resource Strain: Not on file  Food Insecurity: Not on file  Transportation Needs: Not on file  Physical Activity: Not on file  Stress: Not on file  Social Connections: Not on file        Objective:  Physical Exam: BP 114/74 (BP Location: Left Arm)    Pulse 66    Temp 98.3 F (36.8 C) (Temporal)    Ht 5\' 3"  (1.6 m)    Wt 156 lb (70.8 kg)    SpO2 100%    BMI 27.63 kg/m   Body mass index is 27.63 kg/m. Wt Readings from Last 3 Encounters:  08/21/21 156 lb (70.8 kg)  05/23/21 155 lb 3.2 oz (70.4 kg)  05/10/21 148 lb (67.1 kg)   Gen: NAD, resting comfortably HEENT: TMs normal bilaterally. OP clear. No thyromegaly noted.  CV: RRR with no murmurs appreciated Pulm: NWOB, CTAB with no crackles, wheezes, or rhonchi GI: Normal bowel sounds present. Soft, Nontender, Nondistended. MSK: no edema, cyanosis, or clubbing noted Skin: warm, dry Neuro: CN2-12 grossly intact. Strength 5/5 in upper and lower extremities. Reflexes symmetric and intact bilaterally.  Psych: Normal affect and  thought content     Gabrielly Mccrystal M. Jerline Pain, MD 08/21/2021 9:05 AM

## 2021-08-21 NOTE — Assessment & Plan Note (Signed)
Follows with cardiology. On crestor 20mg  daily and zetia 10mg  daily.

## 2021-08-22 LAB — COMPREHENSIVE METABOLIC PANEL
ALT: 42 U/L — ABNORMAL HIGH (ref 0–35)
AST: 30 U/L (ref 0–37)
Albumin: 4.6 g/dL (ref 3.5–5.2)
Alkaline Phosphatase: 39 U/L (ref 39–117)
BUN: 15 mg/dL (ref 6–23)
CO2: 26 mEq/L (ref 19–32)
Calcium: 9.5 mg/dL (ref 8.4–10.5)
Chloride: 106 mEq/L (ref 96–112)
Creatinine, Ser: 0.82 mg/dL (ref 0.40–1.20)
GFR: 82.5 mL/min (ref >60.00)
Glucose, Bld: 80 mg/dL (ref 70–99)
Potassium: 3.7 mEq/L (ref 3.5–5.1)
Sodium: 141 mEq/L (ref 135–145)
Total Bilirubin: 0.7 mg/dL (ref 0.2–1.2)
Total Protein: 6.9 g/dL (ref 6.0–8.3)

## 2021-08-23 DIAGNOSIS — R69 Illness, unspecified: Secondary | ICD-10-CM | POA: Diagnosis not present

## 2021-08-23 NOTE — Progress Notes (Signed)
Please inform patient of the following:  Blood work is all STABLE. Do not need to make any changes to her treatment plan at this time. Would like for her to keep up the good work with diet and exercise and we can recheck in a year.  Algis Greenhouse. Jerline Pain, MD 08/23/2021 8:06 AM

## 2021-09-04 ENCOUNTER — Telehealth: Payer: Self-pay | Admitting: Internal Medicine

## 2021-09-04 DIAGNOSIS — R69 Illness, unspecified: Secondary | ICD-10-CM | POA: Diagnosis not present

## 2021-09-04 MED ORDER — ROSUVASTATIN CALCIUM 20 MG PO TABS: 20.0000 mg | ORAL_TABLET | Freq: Every day | ORAL | 3 refills | Status: DC

## 2021-09-04 NOTE — Telephone Encounter (Signed)
°*  STAT* If patient is at the pharmacy, call can be transferred to refill team.   1. Which medications need to be refilled? (please list name of each medication and dose if known) rosuvastatin (CRESTOR) 20 MG tablet  2. Which pharmacy/location (including street and city if local pharmacy) is medication to be sent to? [id number] [place], [place] - [place].  3. Do they need a 30 day or 90 day supply? 30 day   Patient has 4 tablets left. [facility].

## 2021-09-07 ENCOUNTER — Encounter: Payer: Self-pay | Admitting: Obstetrics and Gynecology

## 2021-09-18 DIAGNOSIS — R69 Illness, unspecified: Secondary | ICD-10-CM | POA: Diagnosis not present

## 2021-09-19 ENCOUNTER — Telehealth: Payer: Self-pay | Admitting: *Deleted

## 2021-09-19 NOTE — Telephone Encounter (Signed)
Patient called and left message stating [facility] only shows [facility] # 8 tablet for estradiol 10 mcg tablet Rx. [facility] and was told patient does have refills at the pharmacy and if they increase the tablets to #12 it may increase her copay. I called patient and explained to patient what I was told. I explained to patient Dr.Jertson sent in 3 months supply in Dec 2022. I asked patient to discuss with pharmacy if she wants 30 day supply verses 90 day. Patient verbalized she understood.  ?

## 2021-09-22 ENCOUNTER — Other Ambulatory Visit: Payer: Self-pay

## 2021-09-22 ENCOUNTER — Ambulatory Visit (INDEPENDENT_AMBULATORY_CARE_PROVIDER_SITE_OTHER)
Admission: RE | Admit: 2021-09-22 | Discharge: 2021-09-22 | Disposition: A | Discharge status: Home / Self Care | Payer: 59 | Source: Ambulatory Visit | Attending: Internal Medicine | Admitting: Internal Medicine

## 2021-09-22 DIAGNOSIS — R918 Other nonspecific abnormal finding of lung field: Secondary | ICD-10-CM | POA: Diagnosis not present

## 2021-09-22 DIAGNOSIS — R911 Solitary pulmonary nodule: Secondary | ICD-10-CM

## 2021-09-30 LAB — LIPID PANEL
Chol/HDL Ratio: 2.2 ratio (ref 0.0–4.4)
Cholesterol, Total: 149 mg/dL (ref 100–199)
HDL: 69 mg/dL (ref >39)
LDL Chol Calc (NIH): 69 mg/dL (ref 0–99)
Triglycerides: 48 mg/dL (ref 0–149)
VLDL Cholesterol Cal: 11 mg/dL (ref 5–40)

## 2021-09-30 LAB — LIPOPROTEIN A (LPA): Lipoprotein (a): 267.3 nmol/L — ABNORMAL HIGH (ref <75.0)

## 2021-10-04 DIAGNOSIS — R69 Illness, unspecified: Secondary | ICD-10-CM | POA: Diagnosis not present

## 2021-10-06 ENCOUNTER — Encounter: Payer: Self-pay | Admitting: Internal Medicine

## 2021-10-06 ENCOUNTER — Ambulatory Visit: Payer: 59 | Admitting: Internal Medicine

## 2021-10-06 VITALS — BP 110/62 | HR 75 | Ht 63.0 in | Wt 156.6 lb

## 2021-10-06 DIAGNOSIS — E7841 Elevated Lipoprotein(a): Secondary | ICD-10-CM

## 2021-10-06 DIAGNOSIS — E7800 Pure hypercholesterolemia, unspecified: Secondary | ICD-10-CM

## 2021-10-06 DIAGNOSIS — R931 Abnormal findings on diagnostic imaging of heart and coronary circulation: Secondary | ICD-10-CM

## 2021-10-06 NOTE — Progress Notes (Signed)
? ? ?LIPID CLINIC CONSULT NOTE ? ?Chief Complaint:  ?Follow-up dyslipidemia ? ?Primary Care Physician: ?Christine Barrack, MD ? ?Primary Cardiologist:  ?None ? ?HPI:  ?Christine Nash is a 51 y.o. female who is being seen today for the evaluation of dyslipidemia at the request of Christine Arnt, MD. This is a pleasant 51 year old female who unfortunately recently had to have surgery to have a right ulnar fracture and is currently in a cast and sling.  She was referred by Dr. Eliberto Nash for evaluation of dyslipidemia.  Wonderfully, she is actually lost 30 to 40 pounds recently with significant dietary changes and activity prior to this recent fracture.  Despite the weight loss, however her cholesterol did not significantly improve in fact it actually has increased compared to where it was about 2 years ago.  Total cholesterol now 281, triglycerides 58, HDL 70 and LDL 199.  She does not report a strong family history of heart disease.  I wonder if her HDL of 70 may be cardioprotective for her.  Overall she reports eating a very healthy sounding diet.  She is cut out a lot of sources of saturated fats. ? ?11/21/2020 ? ?Ms. Christine Nash returns today for follow-up.  She seems to be tolerating rosuvastatin without any significant side effects.  Cholesterol has come down significantly.  Total cholesterol is now 172, triglycerides 54, HDL 75 and LDL of 86. ? ?04/07/2021 ? ?Ms. Christine Nash is seen today for follow-up.  Overall she is doing well.  Her cholesterol is trending even a little lower.  Most recent labs showed total cholesterol 152, triglycerides 54, HDL 66 and LDL 75, is on combination 20 mg rosuvastatin and 10 mg of ezetimibe.  Looking back about 8 months ago total cholesterol was 281 with an LDL of 199 and 2 years prior to that the LDL was 197.  This is suggestive of possible familial hyperlipidemia however she also has significant weight loss and dietary changes and therefore this might be metabolic.  Reports she had her  daughter's cholesterol tested who is 27 years old and it was reportedly slightly high. ? ?10/06/2021 ? ?Ms. Christine Nash returns today for follow-up.  Overall she continues to do well.  Her cholesterol has trended even a little lower with total 149, triglycerides 48, HDL 69 and LDL 69.  This is on combination of ezetimibe and rosuvastatin.  I did go ahead and check an LP(a) and not surprisingly it is elevated at 267.3.  I discussed that in detail with her today and that ultimately she may be a candidate for therapy down the road but since she has had no prior coronary events she would not qualify for clinical trial at this time.  Her cholesterol is well controlled and there really is not data to suggest adding a PCSK9 inhibitor nor is FDA approval for this. ? ?PMHx:  ?Past Medical History:  ?Diagnosis Date  ? Acute torn meniscus of knee, unspecified laterality, initial encounter   ? Anemia   ? Anxiety   ? Complication of anesthesia   ? Depression   ? Endometriosis   ? Fracture of right ulnar styloid   ? Hyperlipidemia   ? Osteoporosis   ? PONV (postoperative nausea and vomiting)   ? ? ?Past Surgical History:  ?Procedure Laterality Date  ? BUNIONECTOMY    ? 2008  ? CERVICAL SPINE SURGERY  2011,2012,2017  ? ACDF repair w/instrumentation  ? CESAREAN SECTION  [id number]  ? CHOLECYSTECTOMY N/A 01/17/2019  ? Procedure: LAPAROSCOPIC  CHOLECYSTECTOMY WITH INTRAOPERATIVE CHOLANGIOGRAM;  Surgeon: Alphonsa Overall, MD;  Location: [facility];  Service: General;  Laterality: N/A;  ? KNEE SURGERY    ? left knee  ? LAPAROSCOPIC SUPRACERVICAL HYSTERECTOMY    ? with BSO, at age 67  ? PERCUTANEOUS PINNING Right 08/18/2020  ? Procedure: Closed reduction and percutaneous pinning  of right ulnar fracture;  Surgeon: Cindra Presume, MD;  Location: [place];  Service: Plastics;  Laterality: Right;  60 min  ? SHOULDER SURGERY    ? 1996  ? SUPRACERVICAL ABDOMINAL HYSTERECTOMY  2013  ? TUBAL LIGATION  2011  ? ? ?FAMHx:  ?Family History   ?Problem Relation Age of Onset  ? Stroke Mother   ? COPD Mother   ? Bladder Cancer Father   ? Melanoma Father   ? Heart attack Father   ? Breast cancer Maternal Aunt   ? Diabetes Maternal Grandmother   ? Colon polyps Neg Hx   ? Colon cancer Neg Hx   ? Esophageal cancer Neg Hx   ? Rectal cancer Neg Hx   ? Stomach cancer Neg Hx   ? ? ?SOCHx:  ? reports that she has never smoked. She has never used smokeless tobacco. She reports that she does not currently use alcohol. She reports that she does not use drugs. ? ?ALLERGIES:  ?Allergies  ?Allergen Reactions  ? Meperidine Hcl Hives  ? Prochlorperazine Edisylate   ?  Other reaction(s): agitiation  ? Propranolol   ?  "brain fog" fatigue   ? Trokendi Kellogg Er] Other (See Comments)  ?  "brain fog"   ? Phentermine Palpitations  ?  Chest pain / palpitations   ? ? ?ROS: ?Pertinent items noted in HPI and remainder of comprehensive ROS otherwise negative. ? ?HOME MEDS: ?Current Outpatient Medications on File Prior to Visit  ?Medication Sig Dispense Refill  ? alendronate (FOSAMAX) 70 MG tablet Take 1 tablet (70 mg total) by mouth every 7 (seven) days. Take first thing in am with 6 oz. Water.  Be upright after taking.  Eat nothing for one hour. 12 tablet 3  ? baclofen (LIORESAL) 20 MG tablet Take 1 tablet (20 mg total) by mouth at bedtime. 90 tablet 1  ? cholecalciferol (VITAMIN D) 25 MCG (1000 UT) tablet Take 4,000 Units by mouth daily.    ? Estradiol 10 MCG TABS vaginal tablet Place 1 tablet (10 mcg total) vaginally 3 (three) times a week. 36 tablet 2  ? ezetimibe (ZETIA) 10 MG tablet Take 1 tablet (10 mg total) by mouth daily. 90 tablet 3  ? ibuprofen (ADVIL) 200 MG tablet Take 800 mg by mouth every 6 (six) hours as needed for mild pain or headache.    ? rosuvastatin (CRESTOR) 20 MG tablet Take 1 tablet (20 mg total) by mouth daily. 90 tablet 3  ? ?No current facility-administered medications on file prior to visit.  ? ? ?LABS/IMAGING: ?No results found for this or any  previous visit (from the past 48 hour(s)). ?No results found. ? ?LIPID PANEL: ?   ?Component Value Date/Time  ? CHOL 149 09/29/2021 0821  ? TRIG 48 09/29/2021 0821  ? HDL 69 09/29/2021 0821  ? CHOLHDL 2.2 09/29/2021 2694  ? CHOLHDL 4 08/10/2020 1016  ? VLDL 11.6 08/10/2020 1016  ? [place] 69 09/29/2021 0821  ? ? ?WEIGHTS: ?Wt Readings from Last 3 Encounters:  ?10/06/21 156 lb 9.6 oz (71 kg)  ?08/21/21 156 lb (70.8 kg)  ?05/23/21 155 lb  3.2 oz (70.4 kg)  ? ? ?VITALS: ?BP 110/62   Pulse 75   Ht '5\' 3"'$  (1.6 m)   Wt 156 lb 9.6 oz (71 kg)   SpO2 99%   BMI 27.74 kg/m?  ? ?EXAM: ?Deferred ? ?EKG: ?Deferred ? ?ASSESSMENT: ?Mixed dyslipidemia with LDL greater than 190 ?Recent significant intentional weight loss ?Elevated CAC score of 42, 95th percentile for age and sex matched controls ?Elevated LP(a)-267.3 ? ?PLAN: ?1.   Ms. Ginzburg continues to have a lower lipid profile and has made significant dietary changes.  She should remain on her current medications.  She did have an elevated LP(a) which we will make note of but at this time she does not qualify for additional therapies.  Otherwise seems to be doing well.  Her pulmonary nodule was considered benign.  Plan follow-up with me annually or sooner as necessary ? ?Pixie Casino, MD, [facility], FACP  ?[place]  ?Medical Director of the Advanced Lipid Disorders &  ?Cardiovascular Risk Reduction Clinic ?Diplomate of the [employer] of Clinical Lipidology ?Attending Cardiologist  ?Direct Dial: [phone]  Fax: [phone]  ?Website:  [email or web address] ? ?Nadean Corwin Alezandra Egli ?10/06/2021, 2:58 PM ?

## 2021-10-06 NOTE — Patient Instructions (Signed)
Medication Instructions:  ?Your physician recommends that you continue on your current medications as directed. Please refer to the Current Medication list given to you today. ? ?*If you need a refill on your cardiac medications before your next appointment, please call your pharmacy* ? ? ?Lab Work: ?FASTING lab work to check cholesterol in about 6 months -- before next visit  ? ?If you have labs (blood work) drawn today and your tests are completely normal, you will receive your results only by: ?MyChart Message (if you have MyChart) OR ?A paper copy in the mail ?If you have any lab test that is abnormal or we need to change your treatment, we will call you to review the results. ? ? ?Testing/Procedures: ?NONE ? ? ?Follow-Up: ?At [facility], you and your health needs are our priority.  As part of our continuing mission to provide you with exceptional heart care, we have created designated Provider Care Teams.  These Care Teams include your primary Cardiologist (physician) and Advanced Practice Providers (APPs -  Physician Assistants and Nurse Practitioners) who all work together to provide you with the care you need, when you need it. ? ?We recommend signing up for the patient portal called "MyChart".  Sign up information is provided on this After Visit Summary.  MyChart is used to connect with patients for Virtual Visits (Telemedicine).  Patients are able to view lab/test results, encounter notes, upcoming appointments, etc.  Non-urgent messages can be sent to your provider as well.   ?To learn more about what you can do with MyChart, go to [email or web address].   ? ?Your next appointment:   ?6 month(s) ? ?The format for your next appointment:   ?In Person ? ?Provider:   ?Dr. Debara Pickett  ? ?

## 2021-10-18 DIAGNOSIS — R69 Illness, unspecified: Secondary | ICD-10-CM | POA: Diagnosis not present

## 2021-10-30 ENCOUNTER — Encounter: Payer: Self-pay | Admitting: Family Medicine

## 2021-10-30 NOTE — Telephone Encounter (Signed)
Please advise 

## 2021-10-31 ENCOUNTER — Other Ambulatory Visit: Payer: Self-pay | Admitting: *Deleted

## 2021-10-31 MED ORDER — BACLOFEN 20 MG PO TABS: 20.0000 mg | ORAL_TABLET | Freq: Three times a day (TID) | ORAL | 0 refills | Status: DC

## 2021-10-31 NOTE — Progress Notes (Signed)
bac

## 2021-10-31 NOTE — Telephone Encounter (Signed)
Ok to send in new rx for baclofen '20mg'$  tid prn spasms for 90 day supply. ? ?Algis Greenhouse. Jerline Pain, MD ?10/31/2021 8:34 AM  ? ?

## 2021-11-12 DIAGNOSIS — R079 Chest pain, unspecified: Secondary | ICD-10-CM | POA: Diagnosis not present

## 2021-11-23 ENCOUNTER — Telehealth: Payer: Self-pay | Admitting: Internal Medicine

## 2021-11-23 NOTE — Telephone Encounter (Signed)
Contacted patient, she states that she has had some episodes of dizziness, and feeling cloudy in her head. Once time she had squatted for a while looking at something, and stood up and became light headed, where she had to brace herself against the wall for about a minute and half, then it went away. Then the other day she picked her daughter up from school, was driving around the neighborhood when she began to feel unwell, she pulled over waited for it to subside and returned back home. I did advise with patient to keep appointment to discuss further with PA tomorrow morning. Patient will do this, she was thankful for call back. Patient is okay today she does mention she has a headache, but nothing else.   Advised I would route to PA for appointment tomorrow.

## 2021-11-23 NOTE — Telephone Encounter (Signed)
See communication regarding symptoms for appointment 11/24/21  [place] (9:47 AM)    Doristine Devoid, thank you.     Are you dizzy now? No    Do you feel faint or have you passed out? No but did feel this way yesterday (also happened on 5/11)   Do you have any other symptoms? Headache and slight stomachache    Have you checked your HR and BP (record if available)? BP: 117/82 HR: 75    You  Johny Drilling [employer] (9:14 AM)   Hyman Hopes great.  I have you scheduled to see the PA on Friday at 9:15 am.   If you could answer the following questions it would be helpful   STAT if patient feels like he/she is going to faint    Are you dizzy now?    Do you feel faint or have you passed out?    Do you have any other symptoms?    Have you checked your HR and BP (record if available)?     Johny Drilling [place]  You Yesterday (9:02 AM)   Good morning,   Yes, I would be fine with that.  I can come anytime between today and Friday 5/19.   Thank you Anderson Malta [place]    You  Johny Drilling [place] Yesterday (9:00 AM)   [place] Morning,   Dr. Debara Pickett does not have any available appointments at this time. Would you be comfortable seeing one of his Physician Assistants or Nurse Practitioners?    Johny Drilling [place]  Patient Appointment Schedule Request Pool 2 days ago

## 2021-11-24 ENCOUNTER — Ambulatory Visit (INDEPENDENT_AMBULATORY_CARE_PROVIDER_SITE_OTHER): Payer: 59

## 2021-11-24 ENCOUNTER — Ambulatory Visit: Payer: 59 | Admitting: Physician Assistant

## 2021-11-24 ENCOUNTER — Encounter: Payer: Self-pay | Admitting: Physician Assistant

## 2021-11-24 VITALS — BP 116/68 | HR 65 | Ht 63.0 in | Wt 160.0 lb

## 2021-11-24 DIAGNOSIS — R079 Chest pain, unspecified: Secondary | ICD-10-CM

## 2021-11-24 DIAGNOSIS — R0989 Other specified symptoms and signs involving the circulatory and respiratory systems: Secondary | ICD-10-CM

## 2021-11-24 DIAGNOSIS — R42 Dizziness and giddiness: Secondary | ICD-10-CM | POA: Diagnosis not present

## 2021-11-24 NOTE — Telephone Encounter (Signed)
Seen in the office today.

## 2021-11-24 NOTE — Progress Notes (Signed)
Cardiology Office Note:    Date:  11/26/2021   ID:  Christine Nash, DOB 31-Mar-1971, MRN [id number]  PCP:  Vivi Barrack, MD   [facility] Providers Cardiologist:  Pixie Casino, MD     Referring MD: Vivi Barrack, MD   Chief Complaint  Patient presents with   Follow-up   Headache   Chest Pain    May 7th but none since then.    History of Present Illness:    Christine Nash is a 51 y.o. female with a hx of hyperlipidemia.  She has been treated with Crestor and Zetia.  Previous lipoprotein A was elevated to 267.3.  Most recent blood work showed very well-controlled cholesterol. More recently, patient was seen in the urgent care on 11/12/2021 with chest pain.  Chest pain was felt to be noncardiac in nature.  Patient contacted cardiology service yesterday to report episodes of dizziness when she changed body positions.  Patient presents today for evaluation of 2 different issues.  She had an episode of chest pain on 5/6 after eating pizza.  The chest discomfort did not worsen with deep inspiration, body rotation or palpation.  She sought medical attention on 5/7 and was reassured her EKG was normal.  Chest discomfort eventually went away by morning of 5/8.  She described it as a one continuous episode of chest discomfort.  She did not have any more chest pain since even with exertion.  She did complain of episode of dizziness on 5/11 after she squatted down and tried to get up, however this quickly went away.  On Tuesday, 11/21/2021, while driving to pick up her daughter, she started having increased dizziness and fatigue.  She went home and lay down.  She did not check her blood pressure or heart rate.  She was still feeling poorly the following day on 5/17 however symptoms resolved after that.  She has been feeling normally yesterday and today.  She did not have any chest discomfort or prior to the dizziness.  She did feel like she was going to pass out.  On physical exam,  she has mild bruit in the left carotid artery, I recommended carotid ultrasound.  I also recommended a echocardiogram and 2-week Zio monitor.  I did not order a coronary CT at this time as her chest pain has not recurred since earlier this month.  If the echocardiogram is abnormal or she has any further chest discomfort, I would have very low threshold of pursuing ischemic work-up.  I did instruct the patient to drink more fluid between 32 to 64 ounces per day.  EKG was personally reviewed today and did not show any new changes when compared to the previous EKG.  Past Medical History:  Diagnosis Date   Acute torn meniscus of knee, unspecified laterality, initial encounter    Anemia    Anxiety    Complication of anesthesia    Depression    Endometriosis    Fracture of right ulnar styloid    Hyperlipidemia    Osteoporosis    PONV (postoperative nausea and vomiting)     Past Surgical History:  Procedure Laterality Date   BUNIONECTOMY     2008   CERVICAL SPINE SURGERY  2011,2012,2017   ACDF repair w/instrumentation   CESAREAN SECTION  2423,5361   CHOLECYSTECTOMY N/A 01/17/2019   Procedure: LAPAROSCOPIC CHOLECYSTECTOMY WITH INTRAOPERATIVE CHOLANGIOGRAM;  Surgeon: Alphonsa Overall, MD;  Location: [facility];  Service: General;  Laterality: N/A;   KNEE  SURGERY     left knee   LAPAROSCOPIC SUPRACERVICAL HYSTERECTOMY     with BSO, at age 33   PERCUTANEOUS PINNING Right 08/18/2020   Procedure: Closed reduction and percutaneous pinning  of right ulnar fracture;  Surgeon: Cindra Presume, MD;  Location: [facility];  Service: Plastics;  Laterality: Right;  60 min   SHOULDER SURGERY     1996   SUPRACERVICAL ABDOMINAL HYSTERECTOMY  2013   TUBAL LIGATION  2011    Current Medications: Current Meds  Medication Sig   alendronate (FOSAMAX) 70 MG tablet Take 1 tablet (70 mg total) by mouth every 7 (seven) days. Take first thing in am with 6 oz. Water.  Be upright after taking.  Eat nothing  for one hour.   baclofen (LIORESAL) 20 MG tablet Take 1 tablet (20 mg total) by mouth 3 (three) times daily.   cholecalciferol (VITAMIN D) 25 MCG (1000 UT) tablet Take 4,000 Units by mouth daily.   ibuprofen (ADVIL) 200 MG tablet Take 800 mg by mouth every 6 (six) hours as needed for mild pain or headache.   rosuvastatin (CRESTOR) 20 MG tablet Take 1 tablet (20 mg total) by mouth daily.     Allergies:   Meperidine hcl, Prochlorperazine edisylate, Propranolol, Trokendi xr [topiramate er], and Phentermine   Social History   Socioeconomic History   Marital status: Married    Spouse name: Not on file   Number of children: 3   Years of education: Not on file   Highest education level: Not on file  Occupational History   Not on file  Tobacco Use   Smoking status: Never   Smokeless tobacco: Never  Vaping Use   Vaping Use: Never used  Substance and Sexual Activity   Alcohol use: Not Currently   Drug use: Never   Sexual activity: Yes    Birth control/protection: Surgical  Other Topics Concern   Not on file  Social History Narrative   Lives with husband and 2 of her children   Right handed   Caffeine: 1 cup of coffee in the morning and occasionally sweet tea   Social Determinants of Health   Financial Resource Strain: Not on file  Food Insecurity: Not on file  Transportation Needs: Not on file  Physical Activity: Not on file  Stress: Not on file  Social Connections: Not on file     Family History: The patient's family history includes Bladder Cancer in her father; Breast cancer in her maternal aunt; COPD in her mother; Diabetes in her maternal grandmother; Heart attack in her father; Melanoma in her father; Stroke in her mother. There is no history of Colon polyps, Colon cancer, Esophageal cancer, Rectal cancer, or Stomach cancer.  ROS:   Please see the history of present illness.     All other systems reviewed and are negative.  EKGs/Labs/Other Studies Reviewed:    The  following studies were reviewed today:  N/A  EKG:  EKG is ordered today.  The ekg ordered today demonstrates normal sinus rhythm, no significant ST-T wave changes  Recent Labs: 04/11/2021: Magnesium 1.9 08/21/2021: ALT 42; BUN 15; Creatinine, Ser 0.82; Hemoglobin 12.5; Platelets 143.0; Potassium 3.7; Sodium 141; TSH 1.56  Recent Lipid Panel    Component Value Date/Time   CHOL 149 09/29/2021 0821   TRIG 48 09/29/2021 0821   HDL 69 09/29/2021 0821   CHOLHDL 2.2 09/29/2021 0821   CHOLHDL 4 08/10/2020 1016   VLDL 11.6 08/10/2020 1016  LDLCALC 69 09/29/2021 0821     Risk Assessment/Calculations:           Physical Exam:    VS:  BP 116/68 (BP Location: Left Arm, Patient Position: Sitting, Cuff Size: Normal)   Pulse 65   Ht '5\' 3"'$  (1.6 m)   Wt 160 lb (72.6 kg)   BMI 28.34 kg/m     Wt Readings from Last 3 Encounters:  11/24/21 160 lb (72.6 kg)  10/06/21 156 lb 9.6 oz (71 kg)  08/21/21 156 lb (70.8 kg)     GEN:  Well nourished, well developed in no acute distress HEENT: Normal NECK: No JVD; No carotid bruits LYMPHATICS: No lymphadenopathy CARDIAC: RRR, no murmurs, rubs, gallops RESPIRATORY:  Clear to auscultation without rales, wheezing or rhonchi  ABDOMEN: Soft, non-tender, non-distended MUSCULOSKELETAL:  No edema; No deformity  SKIN: Warm and dry NEUROLOGIC:  Alert and oriented x 3 PSYCHIATRIC:  Normal affect   ASSESSMENT:    1. Dizziness   2. Carotid bruit, unspecified laterality   3. Chest pain of uncertain etiology    PLAN:    In order of problems listed above:  Dizziness: Patient has had at least 2 episodes of dizziness.  The most recent episode was not associated with body position changes.  Patient was driving in the car to pick up her daughter when the dizziness occurred.  Obtain 14-day heart monitor  Carotid bruit: Patient has mild carotid bruit.  I recommended a carotid ultrasound  Chest pain: She had a single episode of chest pain on 5/7 and was  seen in the urgent care.  Her chest pain was felt to be atypical.  She thought it was more GI in nature as she ate pizza.  No troponin was obtained.  The chest pain lasted an entire day and resolved by the following day.  I recommended to obtain echocardiogram to assess EF and wall motion.  If abnormal, will have low threshold to consider a coronary CT.           Medication Adjustments/Labs and Tests Ordered: Current medicines are reviewed at length with the patient today.  Concerns regarding medicines are outlined above.  Orders Placed This Encounter  Procedures   LONG TERM MONITOR (3-14 DAYS)   EKG 12-Lead   ECHOCARDIOGRAM COMPLETE   VAS US CAROTID   No orders of the defined types were placed in this encounter.   Patient Instructions  Medication Instructions:  Your physician recommends that you continue on your current medications as directed. Please refer to the Current Medication list given to you today.  *If you need a refill on your cardiac medications before your next appointment, please call your pharmacy*  Lab Work: NONE ordered at this time of appointment   If you have labs (blood work) drawn today and your tests are completely normal, you will receive your results only by: [place] (if you have MyChart) OR A paper copy in the mail If you have any lab test that is abnormal or we need to change your treatment, we will call you to review the results.   Testing/Procedures: Your physician has requested that you have a carotid duplex. This test is an ultrasound of the carotid arteries in your neck. It looks at blood flow through these arteries that supply the brain with blood. Allow one hour for this exam. There are no restrictions or special instructions. This test is performed at [place] [address] [place], [place] [address]  Your physician has  requested that you have an echocardiogram. Echocardiography is a painless test that uses sound waves to create images  of your heart. It provides your doctor with information about the size and shape of your heart and how well your heart's chambers and valves are working. This procedure takes approximately one hour. There are no restrictions for this procedure. This test is performed at [address], [place] [address]  [place] Instructions  Your physician has requested you wear a ZIO patch monitor for 14 days.  This is a single patch monitor. [employer] supplies one patch monitor per enrollment. Additional stickers are not available. Please do not apply patch if you will be having a Nuclear Stress Test,  Echocardiogram, Cardiac CT, MRI, or Chest Xray during the period you would be wearing the  monitor. The patch cannot be worn during these tests. You cannot remove and re-apply the  ZIO XT patch monitor.  Your ZIO patch monitor will be mailed 3 day USPS to your address on file. It may take 3-5 days  to receive your monitor after you have been enrolled.  Once you have received your monitor, please review the enclosed instructions. Your monitor  has already been registered assigning a specific monitor serial # to you.  Billing and Patient Assistance Program Information  We have supplied [employer] with any of your insurance information on file for billing purposes. [employer] offers a sliding scale Patient Assistance Program for patients that do not have  insurance, or whose insurance does not completely cover the cost of the ZIO monitor.  You must apply for the Patient Assistance Program to qualify for this discounted rate.  To apply, please call [employer] at [phone], select option 4, select option 2, ask to apply for  Patient Assistance Program. Theodore Demark will ask your household income, and how many people  are in your household. They will quote your out-of-pocket cost based on that information.  [employer] will also be able to set up a 70-month interest-free payment plan if  needed.  Applying the monitor   Shave hair from upper left chest.  Hold abrader disc by orange tab. Rub abrader in 40 strokes over the upper left chest as  indicated in your monitor instructions.  Clean area with 4 enclosed alcohol pads. Let dry.  Apply patch as indicated in monitor instructions. Patch will be placed under collarbone on left  side of chest with arrow pointing upward.  Rub patch adhesive wings for 2 minutes. Remove white label marked "1". Remove the white  label marked "2". Rub patch adhesive wings for 2 additional minutes.  While looking in a mirror, press and release button in center of patch. A small green light will  flash 3-4 times. This will be your only indicator that the monitor has been turned on.  Do not shower for the first 24 hours. You may shower after the first 24 hours.  Press the button if you feel a symptom. You will hear a small click. Record Date, Time and  Symptom in the Patient Logbook.  When you are ready to remove the patch, follow instructions on the last 2 pages of Patient  Logbook. Stick patch monitor onto the last page of Patient Logbook.  Place Patient Logbook in the blue and white box. Use locking tab on box and tape box closed  securely. The blue and white box has prepaid postage on it. Please place it in the mailbox as  soon as  possible. Your physician should have your test results approximately 7 days after the  monitor has been mailed back to [facility].  Call [place] at [phone] if you have questions regarding  your ZIO XT patch monitor. Call them immediately if you see an orange light blinking on your  monitor.  If your monitor falls off in less than 4 days, contact our Monitor department at [phone].  If your monitor becomes loose or falls off after 4 days call [employer] at [phone] for  suggestions on securing your monitor  Follow-Up: At [facility], you and your health needs are our priority.   As part of our continuing mission to provide you with exceptional heart care, we have created designated Provider Care Teams.  These Care Teams include your primary Cardiologist (physician) and Advanced Practice Providers (APPs -  Physician Assistants and Nurse Practitioners) who all work together to provide you with the care you need, when you need it.  We recommend signing up for the patient portal called "MyChart".  Sign up information is provided on this After Visit Summary.  MyChart is used to connect with patients for Virtual Visits (Telemedicine).  Patients are able to view lab/test results, encounter notes, upcoming appointments, etc.  Non-urgent messages can be sent to your provider as well.   To learn more about what you can do with MyChart, go to [email or web address].    Your next appointment:   5-6 week(s)  The format for your next appointment:   In Person  Provider:   Pixie Casino, MD  or Almyra Deforest, PA-C        Other Instructions   Important Information About Sugar         Hilbert Corrigan, [place]  11/26/2021 11:51 PM    [place]

## 2021-11-24 NOTE — Patient Instructions (Signed)
Medication Instructions:  Your physician recommends that you continue on your current medications as directed. Please refer to the Current Medication list given to you today.  *If you need a refill on your cardiac medications before your next appointment, please call your pharmacy*  Lab Work: NONE ordered at this time of appointment   If you have labs (blood work) drawn today and your tests are completely normal, you will receive your results only by: [place] (if you have MyChart) OR A paper copy in the mail If you have any lab test that is abnormal or we need to change your treatment, we will call you to review the results.   Testing/Procedures: Your physician has requested that you have a carotid duplex. This test is an ultrasound of the carotid arteries in your neck. It looks at blood flow through these arteries that supply the brain with blood. Allow one hour for this exam. There are no restrictions or special instructions. This test is performed at [place], [place] [address]  Your physician has requested that you have an echocardiogram. Echocardiography is a painless test that uses sound waves to create images of your heart. It provides your doctor with information about the size and shape of your heart and how well your heart's chambers and valves are working. This procedure takes approximately one hour. There are no restrictions for this procedure. This test is performed at [address], [place] [address]  [place] Instructions  Your physician has requested you wear a ZIO patch monitor for 14 days.  This is a single patch monitor. [employer] supplies one patch monitor per enrollment. Additional stickers are not available. Please do not apply patch if you will be having a Nuclear Stress Test,  Echocardiogram, Cardiac CT, MRI, or Chest Xray during the period you would be wearing the  monitor. The patch cannot be worn during  these tests. You cannot remove and re-apply the  ZIO XT patch monitor.  Your ZIO patch monitor will be mailed 3 day USPS to your address on file. It may take 3-5 days  to receive your monitor after you have been enrolled.  Once you have received your monitor, please review the enclosed instructions. Your monitor  has already been registered assigning a specific monitor serial # to you.  Billing and Patient Assistance Program Information  We have supplied [employer] with any of your insurance information on file for billing purposes. [employer] offers a sliding scale Patient Assistance Program for patients that do not have  insurance, or whose insurance does not completely cover the cost of the ZIO monitor.  You must apply for the Patient Assistance Program to qualify for this discounted rate.  To apply, please call [employer] at [phone], select option 4, select option 2, ask to apply for  Patient Assistance Program. Theodore Demark will ask your household income, and how many people  are in your household. They will quote your out-of-pocket cost based on that information.  [employer] will also be able to set up a 57-month interest-free payment plan if needed.  Applying the monitor   Shave hair from upper left chest.  Hold abrader disc by orange tab. Rub abrader in 40 strokes over the upper left chest as  indicated in your monitor instructions.  Clean area with 4 enclosed alcohol pads. Let dry.  Apply patch as indicated in monitor instructions. Patch will be placed under collarbone on left  side of  chest with arrow pointing upward.  Rub patch adhesive wings for 2 minutes. Remove white label marked "1". Remove the white  label marked "2". Rub patch adhesive wings for 2 additional minutes.  While looking in a mirror, press and release button in center of patch. A small green light will  flash 3-4 times. This will be your only indicator that the monitor has been turned on.  Do not shower for the first 24  hours. You may shower after the first 24 hours.  Press the button if you feel a symptom. You will hear a small click. Record Date, Time and  Symptom in the Patient Logbook.  When you are ready to remove the patch, follow instructions on the last 2 pages of Patient  Logbook. Stick patch monitor onto the last page of Patient Logbook.  Place Patient Logbook in the blue and white box. Use locking tab on box and tape box closed  securely. The blue and white box has prepaid postage on it. Please place it in the mailbox as  soon as possible. Your physician should have your test results approximately 7 days after the  monitor has been mailed back to [facility].  Call [place] at [phone] if you have questions regarding  your ZIO XT patch monitor. Call them immediately if you see an orange light blinking on your  monitor.  If your monitor falls off in less than 4 days, contact our Monitor department at [phone].  If your monitor becomes loose or falls off after 4 days call [employer] at [phone] for  suggestions on securing your monitor  Follow-Up: At [facility], you and your health needs are our priority.  As part of our continuing mission to provide you with exceptional heart care, we have created designated Provider Care Teams.  These Care Teams include your primary Cardiologist (physician) and Advanced Practice Providers (APPs -  Physician Assistants and Nurse Practitioners) who all work together to provide you with the care you need, when you need it.  We recommend signing up for the patient portal called "MyChart".  Sign up information is provided on this After Visit Summary.  MyChart is used to connect with patients for Virtual Visits (Telemedicine).  Patients are able to view lab/test results, encounter notes, upcoming appointments, etc.  Non-urgent messages can be sent to your provider as well.   To learn more about what you can do with MyChart, go to  [email or web address].    Your next appointment:   5-6 week(s)  The format for your next appointment:   In Person  Provider:   Pixie Casino, MD  or Almyra Deforest, PA-C        Other Instructions   Important Information About Sugar

## 2021-11-24 NOTE — [place] Notes (Unsigned)
Enrolled patient for a 14 day Zio XT monitor to be mailed to patients home  Dr Debara Pickett to read

## 2021-11-26 ENCOUNTER — Encounter: Payer: Self-pay | Admitting: Physician Assistant

## 2021-11-27 DIAGNOSIS — Z01419 Encounter for gynecological examination (general) (routine) without abnormal findings: Secondary | ICD-10-CM | POA: Diagnosis not present

## 2021-11-27 DIAGNOSIS — Z6828 Body mass index (BMI) 28.0-28.9, adult: Secondary | ICD-10-CM | POA: Diagnosis not present

## 2021-11-27 DIAGNOSIS — R69 Illness, unspecified: Secondary | ICD-10-CM | POA: Diagnosis not present

## 2021-11-27 DIAGNOSIS — Z1389 Encounter for screening for other disorder: Secondary | ICD-10-CM | POA: Diagnosis not present

## 2021-11-27 DIAGNOSIS — N952 Postmenopausal atrophic vaginitis: Secondary | ICD-10-CM | POA: Diagnosis not present

## 2021-11-27 DIAGNOSIS — Z130 Encounter for screening for diseases of the blood and blood-forming organs and certain disorders involving the immune mechanism: Secondary | ICD-10-CM | POA: Diagnosis not present

## 2021-11-29 ENCOUNTER — Other Ambulatory Visit: Payer: Self-pay

## 2021-11-29 ENCOUNTER — Emergency Department (HOSPITAL_COMMUNITY): Payer: 59

## 2021-11-29 ENCOUNTER — Encounter (HOSPITAL_COMMUNITY): Payer: Self-pay

## 2021-11-29 ENCOUNTER — Emergency Department (HOSPITAL_COMMUNITY)
Admission: EM | Admit: 2021-11-29 | Discharge: 2021-11-29 | Disposition: A | Discharge status: Home / Self Care | Payer: 59 | Attending: Emergency Medicine | Admitting: Emergency Medicine

## 2021-11-29 DIAGNOSIS — R002 Palpitations: Secondary | ICD-10-CM | POA: Diagnosis not present

## 2021-11-29 DIAGNOSIS — R42 Dizziness and giddiness: Secondary | ICD-10-CM | POA: Insufficient documentation

## 2021-11-29 DIAGNOSIS — R0602 Shortness of breath: Secondary | ICD-10-CM | POA: Diagnosis not present

## 2021-11-29 DIAGNOSIS — R0789 Other chest pain: Secondary | ICD-10-CM | POA: Diagnosis not present

## 2021-11-29 DIAGNOSIS — R079 Chest pain, unspecified: Secondary | ICD-10-CM | POA: Insufficient documentation

## 2021-11-29 LAB — D-DIMER, QUANTITATIVE: D-Dimer, Quant: <0.27 ug/mL-FEU (ref 0.00–0.50)

## 2021-11-29 LAB — CBC
HCT: 37.4 % (ref 36.0–46.0)
Hemoglobin: 12.8 g/dL (ref 12.0–15.0)
MCH: 30.9 pg (ref 26.0–34.0)
MCHC: 34.2 g/dL (ref 30.0–36.0)
MCV: 90.3 fL (ref 80.0–100.0)
Platelets: 161 10*3/uL (ref 150–400)
RBC: 4.14 MIL/uL (ref 3.87–5.11)
RDW: 11.9 % (ref 11.5–15.5)
WBC: 5 10*3/uL (ref 4.0–10.5)
nRBC: 0 % (ref 0.0–0.2)

## 2021-11-29 LAB — BASIC METABOLIC PANEL
Anion gap: 9 (ref 5–15)
BUN: 19 mg/dL (ref 6–20)
CO2: 25 mmol/L (ref 22–32)
Calcium: 9.4 mg/dL (ref 8.9–10.3)
Chloride: 105 mmol/L (ref 98–111)
Creatinine, Ser: 0.83 mg/dL (ref 0.44–1.00)
GFR, Estimated: >60 mL/min (ref >60)
Glucose, Bld: 107 mg/dL — ABNORMAL HIGH (ref 70–99)
Potassium: 3.7 mmol/L (ref 3.5–5.1)
Sodium: 139 mmol/L (ref 135–145)

## 2021-11-29 LAB — TROPONIN I (HIGH SENSITIVITY)
Troponin I (High Sensitivity): <2 ng/L (ref <18)
Troponin I (High Sensitivity): <2 ng/L (ref <18)

## 2021-11-29 MED ORDER — ASPIRIN 81 MG PO CHEW
324.0000 mg | CHEWABLE_TABLET | Freq: Once | ORAL | Status: AC
  Administered 2021-11-29: 324 mg via ORAL
  Filled 2021-11-29: qty 4

## 2021-11-29 NOTE — Discharge Instructions (Signed)
You were seen in the emergency department for rapid heart rate and chest discomfort shortness of breath.  He had blood work EKG and a chest x-ray that did not show an obvious explanation for your symptoms.  Please continue your regular medications and stay well-hydrated.  Follow-up with your cardiology team as scheduled.  Return to the emergency department if any worsening or concerning symptoms.

## 2021-11-29 NOTE — ED Notes (Signed)
All discharge instructions including follow up care reviewed with patient and patient verbalized understanding of same. Patient stable and ambulatory at time of discharge.  

## 2021-11-29 NOTE — ED Notes (Signed)
Patient ambulated to the bathroom with a steady gait

## 2021-11-29 NOTE — ED Provider Notes (Signed)
[facility] EMERGENCY DEPARTMENT Provider Note   CSN: [id number] Arrival date & time: 11/29/21  1719     History {Add pertinent medical, surgical, social history, OB history to HPI:1} Chief Complaint  Patient presents with   Palpitations    Patient arrives with complaints of chest pain and palpitations. Per patient her HR went up to 110 while cutting up bananas in the kitchen. Patient then became light headed denies LOC. Patient also states she is short of breath while laying in bed with no exertion.     Christine Nash is a 51 y.o. female.  She is complaining about 4 hours of rapid strong heartbeat while doing normal activities.  It caused her to feel short of breath and cause her chest pain.  She said she felt very lightheaded with that he continues on now although a little bit better.  She has a history of an abnormal cardiac CT.  She saw cardiology recently for lightheadedness and was found to have a carotid bruit and is post to get a carotid ultrasound tomorrow.  She denies any recent illness.  No stimulant use denies tobacco or any drugs.  Denies having any symptoms prior  The history is provided by the patient.  Palpitations Palpitations quality:  Fast Onset quality:  Sudden Duration:  4 hours Timing:  Constant Progression:  Partially resolved Chronicity:  New Context: not illicit drugs, not nicotine and not stimulant use   Relieved by:  None tried Worsened by:  Nothing Ineffective treatments:  None tried Associated symptoms: chest pain, dizziness and shortness of breath   Associated symptoms: no diaphoresis, no lower extremity edema, no nausea, no numbness, no syncope, no vomiting and no weakness   Risk factors: no diabetes mellitus, no hx of atrial fibrillation, no hx of DVT, no hx of PE, no hx of thyroid disease, no hyperthyroidism and no OTC sinus medications       Home Medications Prior to Admission medications   Medication Sig Start Date End  Date Taking? Authorizing Provider  alendronate (FOSAMAX) 70 MG tablet Take 1 tablet (70 mg total) by mouth every 7 (seven) days. Take first thing in am with 6 oz. Water.  Be upright after taking.  Eat nothing for one hour. 04/11/21   Salvadore Dom, MD  baclofen (LIORESAL) 20 MG tablet Take 1 tablet (20 mg total) by mouth 3 (three) times daily. 10/31/21 01/29/22  Vivi Barrack, MD  cholecalciferol (VITAMIN D) 25 MCG (1000 UT) tablet Take 4,000 Units by mouth daily.    [provider]  ezetimibe (ZETIA) 10 MG tablet Take 1 tablet (10 mg total) by mouth daily. 11/21/20 10/06/21  Pixie Casino, MD  ibuprofen (ADVIL) 200 MG tablet Take 800 mg by mouth every 6 (six) hours as needed for mild pain or headache.    [provider]  rosuvastatin (CRESTOR) 20 MG tablet Take 1 tablet (20 mg total) by mouth daily. 09/04/21 12/03/21  Pixie Casino, MD      Allergies    Meperidine hcl, Prochlorperazine edisylate, Propranolol, Trokendi xr [topiramate er], and Phentermine    Review of Systems   Review of Systems  Constitutional:  Negative for diaphoresis and fever.  HENT:  Negative for sore throat.   Eyes:  Negative for visual disturbance.  Respiratory:  Positive for shortness of breath.   Cardiovascular:  Positive for chest pain and palpitations. Negative for syncope.  Gastrointestinal:  Negative for abdominal pain, nausea and vomiting.  Genitourinary:  Negative for dysuria.  Musculoskeletal:  Negative for neck pain.  Skin:  Negative for rash.  Neurological:  Positive for dizziness. Negative for weakness and numbness.   Physical Exam Updated Vital Signs BP 131/80 (BP Location: Right Arm)   Pulse 73   Temp 97.6 F (36.4 C) (Oral)   Resp 19   Ht '5\' 3"'$  (1.6 m)   Wt 73 kg   SpO2 100%   BMI 28.51 kg/m  Physical Exam Vitals and nursing note reviewed.  Constitutional:      General: She is not in acute distress.    Appearance: Normal appearance. She is well-developed.   HENT:     Head: Normocephalic and atraumatic.  Eyes:     Conjunctiva/sclera: Conjunctivae normal.  Cardiovascular:     Rate and Rhythm: Normal rate and regular rhythm.     Pulses: Normal pulses.     Heart sounds: No murmur heard. Pulmonary:     Effort: Pulmonary effort is normal. No respiratory distress.     Breath sounds: Normal breath sounds.  Abdominal:     Palpations: Abdomen is soft.     Tenderness: There is no abdominal tenderness. There is no guarding or rebound.  Musculoskeletal:        General: No tenderness. Normal range of motion.     Cervical back: Neck supple.     Right lower leg: No edema.     Left lower leg: No edema.  Skin:    General: Skin is warm and dry.     Capillary Refill: Capillary refill takes less than 2 seconds.  Neurological:     General: No focal deficit present.     Mental Status: She is alert.     Sensory: No sensory deficit.     Motor: No weakness.    ED Results / Procedures / Treatments   Labs (all labs ordered are listed, but only abnormal results are displayed) Labs Reviewed - No data to display  EKG EKG Interpretation  Date/Time:  Wednesday Nov 29 2021 17:32:11 EDT Ventricular Rate:  72 PR Interval:  150 QRS Duration: 92 QT Interval:  402 QTC Calculation: 440 R Axis:   66 Text Interpretation: Sinus rhythm No significant change since prior 9/22 Confirmed by Aletta Edouard [phone]) on 11/29/2021 5:40:57 PM  Radiology No results found.  Procedures Procedures  {Document cardiac monitor, telemetry assessment procedure when appropriate:1}  Medications Ordered in ED Medications  aspirin chewable tablet 324 mg (has no administration in time range)    ED Course/ Medical Decision Making/ A&P                           Medical Decision Making Amount and/or Complexity of Data Reviewed Labs: ordered. Radiology: ordered.  Risk OTC drugs.  This patient complains of ***; this involves an extensive number of treatment Options and  is a complaint that carries with it a high risk of complications and morbidity. The differential includes ***  I ordered, reviewed and interpreted labs, which included *** I ordered medication *** and reviewed PMP when indicated. I ordered imaging studies which included *** and I independently    visualized and interpreted imaging which showed *** Additional history obtained from *** Previous records obtained and reviewed *** I consulted *** and discussed lab and imaging findings and discussed disposition.  Cardiac monitoring reviewed, *** Social determinants considered, *** Critical Interventions: ***  After the interventions stated above, I reevaluated the patient and found ***  Admission and further testing considered, ***    {Document critical care time when appropriate:1} {Document review of labs and clinical decision tools ie heart score, Chads2Vasc2 etc:1}  {Document your independent review of radiology images, and any outside records:1} {Document your discussion with family members, caretakers, and with consultants:1} {Document social determinants of health affecting pt's care:1} {Document your decision making why or why not admission, treatments were needed:1} Final Clinical Impression(s) / ED Diagnoses Final diagnoses:  None    Rx / DC Orders ED Discharge Orders     None

## 2021-11-30 ENCOUNTER — Ambulatory Visit (HOSPITAL_COMMUNITY)
Admission: RE | Admit: 2021-11-30 | Discharge: 2021-11-30 | Disposition: A | Discharge status: Home / Self Care | Payer: 59 | Source: Ambulatory Visit | Attending: Physician Assistant | Admitting: Physician Assistant

## 2021-11-30 DIAGNOSIS — R0989 Other specified symptoms and signs involving the circulatory and respiratory systems: Secondary | ICD-10-CM | POA: Insufficient documentation

## 2021-12-01 ENCOUNTER — Telehealth: Payer: Self-pay | Admitting: Physician Assistant

## 2021-12-01 NOTE — Telephone Encounter (Signed)
Patient is calling requesting a callback from [facility] or the nurse to discuss modifying her plan of care.

## 2021-12-01 NOTE — Telephone Encounter (Signed)
Returned call to patient who states that she was calling to let Isaac Laud know that she does not feel like she  needs to wear the heart monitor. Patient states that she wore one last year that was essentially normal and feels there is no indication for this one. Patient states that she also note that around this time last year she started Zetia with her Rosuvastatin and states that's when her dizziness started. Patient states that she had previously stopped her zetia for 2 weeks with no relief from dizziness but states that this was not long enough. Patient states that if her Echo is normal in 2 weeks she would like to stop her Zetia for 1 month and see how she feels. If she feels better she would like to stop the Zetia and discuss something different for cholesterol management. Patient states that she is also hesitant to increase dose of rosuvastatin and may want to switch to something else all together. Patient states that she really feels this is where the dizziness is coming from and states she is even scared to drive. Patient would like to check with Isaac Laud, and see what he thinks. Advised patient I would forward message over to [facility] for him to review. Patient verbalized understanding.

## 2021-12-05 NOTE — Telephone Encounter (Signed)
Although dizziness is probably one of the most common complaint associated with medication side effect, however if her dizziness is still present after holding the zetia for 2 weeks, then I doubt holding the zetia for 1 month would make any difference. Usually in order to see if a medication is causing something, we hold that medication for 5 times the half-life, each half life would reduce the concentration of that medication by 50% in our body, Zetia's half life is 22 hours, that means it usually take about a little less than 5 days for Zetia to leave her system (after 5 half-life, only about 3% of the drug is still in her system). Although I would recommend the heart monitor, however if she want to see the result of the echo prior to considering the heart monitor, I am fine with delaying it. Recent carotid U/S is very reassuring.

## 2021-12-09 DIAGNOSIS — Z1231 Encounter for screening mammogram for malignant neoplasm of breast: Secondary | ICD-10-CM | POA: Diagnosis not present

## 2021-12-14 ENCOUNTER — Other Ambulatory Visit (HOSPITAL_COMMUNITY): Payer: 59

## 2021-12-15 ENCOUNTER — Ambulatory Visit (HOSPITAL_COMMUNITY): Payer: 59 | Attending: Internal Medicine

## 2021-12-15 DIAGNOSIS — R42 Dizziness and giddiness: Secondary | ICD-10-CM | POA: Insufficient documentation

## 2021-12-15 DIAGNOSIS — I340 Nonrheumatic mitral (valve) insufficiency: Secondary | ICD-10-CM

## 2021-12-15 LAB — ECHOCARDIOGRAM COMPLETE
Area-P 1/2: 3.95 cm2
S' Lateral: 3.2 cm

## 2021-12-18 ENCOUNTER — Other Ambulatory Visit: Payer: Self-pay

## 2021-12-18 ENCOUNTER — Telehealth: Payer: Self-pay | Admitting: Physician Assistant

## 2021-12-18 MED ORDER — EZETIMIBE 10 MG PO TABS
10.0000 mg | ORAL_TABLET | Freq: Every day | ORAL | 0 refills | Status: DC
Start: 2021-12-18 — End: 2022-03-15

## 2021-12-18 NOTE — Telephone Encounter (Signed)
Spoke with pt regarding echocardiogram results. Pt states that she was able to read interpretation on mychart and is unclear as to what it means and is concerned. Pt would like clarification on echo, explained to pt that results are often released to mychart before the provider has an opportunity to comment. Pt also would like feedback on a question she put in mychart regarding DHEA.   Per pt: To clarify, the cream delivers '5mg'$  of DHEA per pump? I plan to start with 1/2 that dose and do 1/2 pump e/o day.  My gyn had given me the green light; just want to be certain that will not affect my cholesterol/cholesterol meds.  Pt wants to make sure that it is ok to use topical DHEA cream, she intends to start at a low dose.  Will route to provider to advise.

## 2021-12-18 NOTE — Telephone Encounter (Signed)
See separate message, I have called her regarding the recent echocardiogram result and DHEA cream

## 2021-12-18 NOTE — Telephone Encounter (Signed)
Patient wants interpretation of echo results.   Patient is also wanting a response to her MyChart message.

## 2021-12-18 NOTE — Progress Notes (Signed)
Normal pumping function of heart, normal wall motion, mild mitral valve leakage

## 2021-12-18 NOTE — Telephone Encounter (Signed)
I have called and spoke with Mrs. Music regarding the recent echocardiogram result.  Okay to use DHEA cream.  She is still complaining of intermittent atypical chest pain that occurs mostly at rest.  I recommend a coronary CT, she is going to think about this and get back to me via patient message at a later time.

## 2022-01-01 ENCOUNTER — Encounter: Payer: Self-pay | Admitting: Physician Assistant

## 2022-01-01 ENCOUNTER — Ambulatory Visit: Payer: 59 | Admitting: Physician Assistant

## 2022-01-01 VITALS — BP 128/68 | HR 68 | Ht 63.0 in | Wt 161.4 lb

## 2022-01-01 DIAGNOSIS — R002 Palpitations: Secondary | ICD-10-CM | POA: Diagnosis not present

## 2022-01-01 DIAGNOSIS — E785 Hyperlipidemia, unspecified: Secondary | ICD-10-CM

## 2022-01-01 DIAGNOSIS — R42 Dizziness and giddiness: Secondary | ICD-10-CM | POA: Diagnosis not present

## 2022-01-01 NOTE — Progress Notes (Signed)
Cardiology Office Note:    Date:  01/03/2022   ID:  Christine Nash, DOB [birth year], MRN [id number]  PCP:  Vivi Barrack, MD   [facility] [facility] Cardiologist:  Pixie Casino, MD     Referring MD: Vivi Barrack, MD   Chief Complaint  Patient presents with   Follow-up    Seen for Dr. Debara Pickett    History of Present Illness:    Christine Nash is a 51 y.o. female with a hx of hyperlipidemia.  She has been treated with Crestor and Zetia.  Previous lipoprotein A was elevated to 267.3.  Most recent blood work showed very well-controlled cholesterol. More recently, patient was seen in the urgent care on 11/12/2021 with chest pain.  Chest pain was felt to be noncardiac in nature.  Last saw the patient on 11/24/2021 due to complaint of dizziness when she changed body positions.  She described 2 different episodes, one episode was chest pain that occurred on 5/6 after eating pizza.  She sought medical attention on 5/7 and was reassured that her EKG was normal.  Chest pain eventually went away by the morning of 5/80.  She had a episode of significant dizziness on 5/11 after she squatted down and tried to get up.  This quickly went away.  On Tuesday 5/16, while driving to pick up her daughter, she started having increased dizziness and fatigue.  She went home and lay down.  She did not check her blood pressure or heart rate at home.  I recommended a carotid Doppler, echocardiogram and heart monitor.  Carotid Doppler obtained on 5/25 showed 1 to 39% left ICA disease, no significant stenosis in the right ICA.  Echocardiogram obtained on 12/15/2021 demonstrated EF 54%, normal regional wall motion, mildly dilated left and right atrium, mild MR.  Since the last visit, patient presented to the ED on 11/29/2021 with chest pain and palpitation.  D-dimer was negative.  CBC and basic metabolic panel normal.  Troponin negative x2.  Chest x-ray normal.   Patient presents today for follow-up.  Since  about a month ago, she really has not had any prolonged palpitation.  She still occasionally has brief palpitations however none of which lasted longer than a few seconds.  She just finished wearing the heart monitor, I do not have the result of the heart monitor yet.  She denies any recent exertional type of chest pain.  She does feel more fatigued when she does activity.  She is ready to restart her treadmill exercise.  I asked her to monitor for exertional chest pain, exertional shortness of breath or increased palpitation.  If she does have increased exertional chest pain or shortness of breath, I may consider a coronary CT.  Depend on the heart monitor, if there is no significant irregular rhythm, I would not recommend any further work-up.  She can follow-up with Dr. Debara Pickett in September.    Past Medical History:  Diagnosis Date   Acute torn meniscus of knee, unspecified laterality, initial encounter    Anemia    Anxiety    Complication of anesthesia    Depression    Endometriosis    Fracture of right ulnar styloid    Hyperlipidemia    Osteoporosis    PONV (postoperative nausea and vomiting)     Past Surgical History:  Procedure Laterality Date   BUNIONECTOMY     2008   CERVICAL SPINE SURGERY  2011,2012,2017   ACDF repair w/instrumentation   CESAREAN  SECTION  [id number]   CHOLECYSTECTOMY N/A 01/17/2019   Procedure: LAPAROSCOPIC CHOLECYSTECTOMY WITH INTRAOPERATIVE CHOLANGIOGRAM;  Surgeon: Alphonsa Overall, MD;  Location: [facility];  Service: General;  Laterality: N/A;   KNEE SURGERY     left knee   LAPAROSCOPIC SUPRACERVICAL HYSTERECTOMY     with BSO, at age 24   PERCUTANEOUS PINNING Right 08/18/2020   Procedure: Closed reduction and percutaneous pinning  of right ulnar fracture;  Surgeon: Cindra Presume, MD;  Location: [facility];  Service: Plastics;  Laterality: Right;  60 min   SHOULDER SURGERY     1996   SUPRACERVICAL ABDOMINAL HYSTERECTOMY  2013   TUBAL LIGATION   2011    Current Medications: Current Meds  Medication Sig   alendronate (FOSAMAX) 70 MG tablet Take 1 tablet (70 mg total) by mouth every 7 (seven) days. Take first thing in am with 6 oz. Water.  Be upright after taking.  Eat nothing for one hour.   baclofen (LIORESAL) 20 MG tablet Take 1 tablet (20 mg total) by mouth 3 (three) times daily.   cholecalciferol (VITAMIN D) 25 MCG (1000 UT) tablet Take 4,000 Units by mouth daily.   ezetimibe (ZETIA) 10 MG tablet Take 1 tablet (10 mg total) by mouth daily.   ibuprofen (ADVIL) 200 MG tablet Take 800 mg by mouth every 6 (six) hours as needed for mild pain or headache.   rosuvastatin (CRESTOR) 20 MG tablet Take 1 tablet (20 mg total) by mouth daily.     Allergies:   Meperidine hcl, Prochlorperazine edisylate, Propranolol, Trokendi xr [topiramate er], and Phentermine   Social History   Socioeconomic History   Marital status: Married    Spouse name: Not on file   Number of children: 3   Years of education: Not on file   Highest education level: Not on file  Occupational History   Not on file  Tobacco Use   Smoking status: Never   Smokeless tobacco: Never  Vaping Use   Vaping Use: Never used  Substance and Sexual Activity   Alcohol use: Not Currently   Drug use: Never   Sexual activity: Yes    Birth control/protection: Surgical  Other Topics Concern   Not on file  Social History Narrative   Lives with husband and 2 of her children   Right handed   Caffeine: 1 cup of coffee in the morning and occasionally sweet tea   Social Determinants of Health   Financial Resource Strain: Not on file  Food Insecurity: Not on file  Transportation Needs: Not on file  Physical Activity: Not on file  Stress: Not on file  Social Connections: Not on file     Family History: The patient's family history includes Bladder Cancer in her father; Breast cancer in her maternal aunt; COPD in her mother; Diabetes in her maternal grandmother; Heart  attack in her father; Melanoma in her father; Stroke in her mother. There is no history of Colon polyps, Colon cancer, Esophageal cancer, Rectal cancer, or Stomach cancer.  ROS:   Please see the history of present illness.     All other systems reviewed and are negative.  EKGs/Labs/Other Studies Reviewed:    The following studies were reviewed today:  Echo 12/15/2021 1. Left ventricular ejection fraction by 3D volume is 54 %. The left  ventricle has low normal function. The left ventricle has no regional wall  motion abnormalities. Left ventricular diastolic parameters were normal.  The average left ventricular global  longitudinal strain is -19.3 %. The global longitudinal strain is normal.   2. Right ventricular systolic function is normal. The right ventricular  size is normal. Tricuspid regurgitation signal is inadequate for assessing  PA pressure.   3. Left atrial size was mildly dilated.   4. Right atrial size was mildly dilated.   5. The mitral valve is normal in structure. Mild mitral valve  regurgitation. No evidence of mitral stenosis.   6. The aortic valve is tricuspid. Aortic valve regurgitation is not  visualized. No aortic stenosis is present.   7. The inferior vena cava is dilated in size with >50% respiratory  variability, suggesting right atrial pressure of 8 mmHg.   EKG:  EKG is not ordered today.    Recent Labs: 04/11/2021: Magnesium 1.9 08/21/2021: ALT 42; TSH 1.56 11/29/2021: BUN 19; Creatinine, Ser 0.83; Hemoglobin 12.8; Platelets 161; Potassium 3.7; Sodium 139  Recent Lipid Panel    Component Value Date/Time   CHOL 149 09/29/2021 0821   TRIG 48 09/29/2021 0821   HDL 69 09/29/2021 0821   CHOLHDL 2.2 09/29/2021 0821   CHOLHDL 4 08/10/2020 1016   VLDL 11.6 08/10/2020 1016   LDLCALC 69 09/29/2021 0821     Risk Assessment/Calculations:           Physical Exam:    VS:  BP 128/68   Pulse 68   Ht '5\' 3"'$  (1.6 m)   Wt 161 lb 6.4 oz (73.2 kg)   SpO2 99%    BMI 28.59 kg/m     Wt Readings from Last 3 Encounters:  01/01/22 161 lb 6.4 oz (73.2 kg)  11/29/21 160 lb 15 oz (73 kg)  11/24/21 160 lb (72.6 kg)     GEN:  Well nourished, well developed in no acute distress HEENT: Normal NECK: No JVD; No carotid bruits LYMPHATICS: No lymphadenopathy CARDIAC: RRR, no murmurs, rubs, gallops RESPIRATORY:  Clear to auscultation without rales, wheezing or rhonchi  ABDOMEN: Soft, non-tender, non-distended MUSCULOSKELETAL:  No edema; No deformity  SKIN: Warm and dry NEUROLOGIC:  Alert and oriented x 3 PSYCHIATRIC:  Normal affect   ASSESSMENT:    1. Palpitations   2. Dizziness   3. Hyperlipidemia LDL goal <70    PLAN:    In order of problems listed above:  Palpitation: Pending heart monitor.  Patient denies any significant exertional chest discomfort, will hold off on coronary CT at this time.  She will contact us if she does develop any exertional symptoms  Dizziness: Recent carotid Doppler and echocardiogram were reassuring.  Pending heart monitor result  Hyperlipidemia: On Zetia and Crestor.           Medication Adjustments/Labs and Tests Ordered: Current medicines are reviewed at length with the patient today.  Concerns regarding medicines are outlined above.  No orders of the defined types were placed in this encounter.  No orders of the defined types were placed in this encounter.   Patient Instructions  Medication Instructions:   No Medication Change  *If you need a refill on your cardiac medications before your next appointment, please call your pharmacy*   Lab Work:  No lab work  If you have labs (blood work) drawn today and your tests are completely normal, you will receive your results only by: [place] (if you have MyChart) OR A paper copy in the mail If you have any lab test that is abnormal or we need to change your treatment, we will call you to review the results.  Testing/Procedures:  None  (pending heart monitor result)   Follow-Up: At [facility], you and your health needs are our priority.  As part of our continuing mission to provide you with exceptional heart care, we have created designated Provider Care Teams.  These Care Teams include your primary Cardiologist (physician) and Advanced Practice Providers (APPs -  Physician Assistants and Nurse Practitioners) who all work together to provide you with the care you need, when you need it.  We recommend signing up for the patient portal called "MyChart".  Sign up information is provided on this After Visit Summary.  MyChart is used to connect with patients for Virtual Visits (Telemedicine).  Patients are able to view lab/test results, encounter notes, upcoming appointments, etc.  Non-urgent messages can be sent to your provider as well.   To learn more about what you can do with MyChart, go to [email or web address].    Your next appointment:   3 month(s)  The format for your next appointment:   In Person  Provider:   Pixie Casino, MD     Other Instructions  Continue treadmill exercise, let us know if you have any exertional type of symptom such as chest pain, worsening dyspnea or increased palpiation  Important Information About Sugar         Hilbert Corrigan, [place]  01/03/2022 10:36 PM    [place]

## 2022-01-03 ENCOUNTER — Encounter: Payer: Self-pay | Admitting: Physician Assistant

## 2022-01-04 DIAGNOSIS — R42 Dizziness and giddiness: Secondary | ICD-10-CM | POA: Diagnosis not present

## 2022-01-14 ENCOUNTER — Encounter: Payer: Self-pay | Admitting: Internal Medicine

## 2022-01-15 ENCOUNTER — Ambulatory Visit: Payer: 59 | Admitting: Podiatry

## 2022-01-15 ENCOUNTER — Ambulatory Visit (INDEPENDENT_AMBULATORY_CARE_PROVIDER_SITE_OTHER): Payer: 59

## 2022-01-15 DIAGNOSIS — M21611 Bunion of right foot: Secondary | ICD-10-CM | POA: Diagnosis not present

## 2022-01-15 DIAGNOSIS — M79671 Pain in right foot: Secondary | ICD-10-CM

## 2022-01-15 DIAGNOSIS — M21619 Bunion of unspecified foot: Secondary | ICD-10-CM

## 2022-01-15 NOTE — Patient Instructions (Signed)

## 2022-01-16 ENCOUNTER — Telehealth: Payer: Self-pay | Admitting: Urology

## 2022-01-16 NOTE — Telephone Encounter (Signed)
DOS - 02/07/22  [place] BUNIONECTOMY RIGHT --- [id number]  [employer] EFFECTIVE DATE - 08/09/21  PLAN DEDUCTIBLE - $1,500.00 W/ $321.78 REMAINING OUT OF POCKET - $7,000.00 W/ $4,345.14 REMAIING COINSURANCE - 10% COPAY - $0.00  SPOKE WITH SWATI WITH [employer] AND SHE STATED THAT FOR CPT CODE 08811 NO PRIOR AUTH IS REQUIRED.  REF # [id number]

## 2022-01-18 NOTE — Progress Notes (Signed)
Subjective:   Patient ID: Christine Nash, female   DOB: 51 y.o.   MRN: [id number]   HPI 51 year old female presents the office today for concerns of right foot painful bunion which has been worsening over the last 2 years.  She has sharp pain.  She previously had surgery on her left foot which is done well.  She is attempted numerous conservative over-the-counter inserts, padding, offloading, shoe modifications without significant improvement.  She wants to discuss surgical options.   Review of Systems  All other systems reviewed and are negative.  Past Medical History:  Diagnosis Date   Acute torn meniscus of knee, unspecified laterality, initial encounter    Anemia    Anxiety    Complication of anesthesia    Depression    Endometriosis    Fracture of right ulnar styloid    Hyperlipidemia    Osteoporosis    PONV (postoperative nausea and vomiting)     Past Surgical History:  Procedure Laterality Date   BUNIONECTOMY     2008   CERVICAL SPINE SURGERY  2011,2012,2017   ACDF repair w/instrumentation   CESAREAN SECTION  4098,1191   CHOLECYSTECTOMY N/A 01/17/2019   Procedure: LAPAROSCOPIC CHOLECYSTECTOMY WITH INTRAOPERATIVE CHOLANGIOGRAM;  Surgeon: Alphonsa Overall, MD;  Location: [facility];  Service: General;  Laterality: N/A;   KNEE SURGERY     left knee   LAPAROSCOPIC SUPRACERVICAL HYSTERECTOMY     with BSO, at age 74   PERCUTANEOUS PINNING Right 08/18/2020   Procedure: Closed reduction and percutaneous pinning  of right ulnar fracture;  Surgeon: Cindra Presume, MD;  Location: [facility];  Service: Plastics;  Laterality: Right;  60 min   SHOULDER SURGERY     1996   SUPRACERVICAL ABDOMINAL HYSTERECTOMY  2013   TUBAL LIGATION  2011     Current Outpatient Medications:    alendronate (FOSAMAX) 70 MG tablet, Take 1 tablet (70 mg total) by mouth every 7 (seven) days. Take first thing in am with 6 oz. Water.  Be upright after taking.  Eat nothing for one hour.,  Disp: 12 tablet, Rfl: 3   baclofen (LIORESAL) 20 MG tablet, Take 1 tablet (20 mg total) by mouth 3 (three) times daily., Disp: 270 tablet, Rfl: 0   cholecalciferol (VITAMIN D) 25 MCG (1000 UT) tablet, Take 4,000 Units by mouth daily., Disp: , Rfl:    ezetimibe (ZETIA) 10 MG tablet, Take 1 tablet (10 mg total) by mouth daily., Disp: 90 tablet, Rfl: 0   ibuprofen (ADVIL) 200 MG tablet, Take 800 mg by mouth every 6 (six) hours as needed for mild pain or headache., Disp: , Rfl:    rosuvastatin (CRESTOR) 20 MG tablet, Take 1 tablet (20 mg total) by mouth daily., Disp: 90 tablet, Rfl: 3  Allergies  Allergen Reactions   Meperidine Hcl Hives   Prochlorperazine Edisylate     Other reaction(s): agitiation   Propranolol     "brain fog" fatigue    Trokendi Kellogg Er] Other (See Comments)    "brain fog"    Phentermine Palpitations    Chest pain / palpitations           Objective:  Physical Exam  General: AAO x3, NAD  Dermatological: Skin is warm, dry and supple bilateral. There are no open sores, no preulcerative lesions, no rash or signs of infection present.  Vascular: Dorsalis Pedis artery and Posterior Tibial artery pedal pulses are 2/4 bilateral with immedate capillary fill time. There is no  pain with calf compression, swelling, warmth, erythema.   Neruologic: Grossly intact via light touch bilateral.   Musculoskeletal: Moderate bunion is present with tenderness directly along the 1st metatarsal medially. No pain or crepitation with ROM. No hypermobility of the 1st ray. No other areas of tenderness. Muscular strength 5/5 in all groups tested bilateral.  Gait: Unassisted, Nonantalgic.       Assessment:   Right foot bunion      Plan:  -Treatment options discussed including all alternatives, risks, and complications -Etiology of symptoms were discussed -X-rays were obtained and reviewed with the patient. 3 views of the right foot were obtained. No evidence of acute  fracture. Intermetatarsal angle of 13 degrees.  -We discussed both conservative as well as surgical treatment options.  She is attempted numerous conservative treatments without significant resolution and she was proceed with surgical intervention.  Discussed right foot bunionectomy, likely [place]. She wants to proceed with this. -The incision placement as well as the postoperative course was discussed with the patient. I discussed risks of the surgery which include, but not limited to, infection, bleeding, pain, swelling, need for further surgery, delayed or nonhealing, painful or ugly scar, numbness or sensation changes, over/under correction, recurrence, transfer lesions, further deformity, hardware failure, DVT/PE, loss of toe/foot. Patient understands these risks and wishes to proceed with surgery. The surgical consent was reviewed with the patient all 3 pages were signed. No promises or guarantees were given to the outcome of the procedure. All questions were answered to the best of my ability. Before the surgery the patient was encouraged to call the office if there is any further questions. The surgery will be performed at the [facility] on an outpatient basis.  Trula Slade DPM

## 2022-02-05 ENCOUNTER — Telehealth: Payer: Self-pay

## 2022-02-06 NOTE — Telephone Encounter (Signed)
Spoke to the patient this morning and advise her that she could come and pick up the cam boot or one would be provided for her the day of surgery. Patient states that she would like to get the boot the day of surgery instead which is tomorrow 02/07/22.

## 2022-02-07 ENCOUNTER — Other Ambulatory Visit: Payer: Self-pay | Admitting: Podiatry

## 2022-02-07 ENCOUNTER — Encounter: Payer: Self-pay | Admitting: Podiatry

## 2022-02-07 DIAGNOSIS — M21611 Bunion of right foot: Secondary | ICD-10-CM | POA: Diagnosis not present

## 2022-02-07 DIAGNOSIS — M2011 Hallux valgus (acquired), right foot: Secondary | ICD-10-CM | POA: Diagnosis not present

## 2022-02-07 DIAGNOSIS — G8918 Other acute postprocedural pain: Secondary | ICD-10-CM | POA: Diagnosis not present

## 2022-02-07 MED ORDER — OXYCODONE-ACETAMINOPHEN 5-325 MG PO TABS: 1.0000 | ORAL_TABLET | Freq: Four times a day (QID) | ORAL | 0 refills | Status: DC | PRN

## 2022-02-07 MED ORDER — CEPHALEXIN 500 MG PO CAPS: 500.0000 mg | ORAL_CAPSULE | Freq: Three times a day (TID) | ORAL | 0 refills | Status: DC

## 2022-02-07 NOTE — Progress Notes (Signed)
Postop medications sent 

## 2022-02-09 ENCOUNTER — Telehealth: Payer: Self-pay

## 2022-02-10 ENCOUNTER — Other Ambulatory Visit: Payer: Self-pay | Admitting: Podiatry

## 2022-02-10 MED ORDER — GABAPENTIN 300 MG PO CAPS: 300.0000 mg | ORAL_CAPSULE | Freq: Three times a day (TID) | ORAL | 0 refills | Status: DC

## 2022-02-10 MED ORDER — OXYCODONE HCL 10 MG PO TABS: 10.0000 mg | ORAL_TABLET | ORAL | 0 refills | Status: DC | PRN

## 2022-02-12 ENCOUNTER — Ambulatory Visit (INDEPENDENT_AMBULATORY_CARE_PROVIDER_SITE_OTHER): Payer: 59 | Admitting: Podiatry

## 2022-02-12 ENCOUNTER — Ambulatory Visit (INDEPENDENT_AMBULATORY_CARE_PROVIDER_SITE_OTHER): Payer: 59

## 2022-02-12 DIAGNOSIS — Z9889 Other specified postprocedural states: Secondary | ICD-10-CM | POA: Diagnosis not present

## 2022-02-12 DIAGNOSIS — M21619 Bunion of unspecified foot: Secondary | ICD-10-CM

## 2022-02-12 NOTE — Progress Notes (Unsigned)
Subjective: Chief Complaint  Patient presents with   Routine Post Op    POV #1 DOS 02/07/22  --- RIGHT FOOT SURGICAL CORRECTION OF BUNION([place]) , Patient has had some Nausea but deines any F/C/V/SOB, Patient stated she fell this morning trying to get to the bathroom, patient is having some pain and rates the pain 3 out of 10, TX: cam boot, pain meds and knee scooter X-Rays done today    51 year old female presents with the above complaints.  She states that her pain is getting better.  She has some nausea but no chest pain or shortness of breath.  She been in the cam boot and she has been using a knee scooter.  Objective: AAO x3, NAD DP/PT pulses palpable bilaterally, CRT less than 3 seconds Right foot incision well coapted with sutures intact.  There is mild edema there is no erythema or any signs of infection.  Mild discomfort on palpation to the surgical site.  No other areas of discomfort.  Clinically the toe is in a rectus position. No pain with calf compression, swelling, warmth, erythema  Assessment: Status post right foot [place] Bunionectomy  Plan: -All treatment options discussed with the patient including all alternatives, risks, complications.  -X-rays obtained and reviewed of the right foot.  3 views obtained.  Status post first metatarsal ostomy with screw fixation.  No evidence of acute fracture otherwise. -Incision appears to be healing well.  Antibiotic ointment and dressing applied.  Keep the dressing clean, dry, intact -Can transition to weight-bear as tolerated in cam boot as tolerated. -Ice and elevation -Wear cam boot at all times -Patient encouraged to call the office with any questions, concerns, change in symptoms.   Trula Slade DPM

## 2022-02-13 ENCOUNTER — Other Ambulatory Visit: Payer: Self-pay

## 2022-02-13 MED ORDER — OXYCODONE HCL 10 MG PO TABS: 10.0000 mg | ORAL_TABLET | ORAL | 0 refills | Status: DC | PRN

## 2022-02-13 NOTE — Telephone Encounter (Signed)
Patient is aware 

## 2022-02-13 NOTE — Telephone Encounter (Signed)
I went ahead and sent over the refill. Hopefully after this we can decrease the pain medication amount.

## 2022-02-19 ENCOUNTER — Other Ambulatory Visit: Payer: Self-pay | Admitting: Podiatry

## 2022-02-19 ENCOUNTER — Telehealth: Payer: Self-pay | Admitting: *Deleted

## 2022-02-19 MED ORDER — OXYCODONE HCL 5 MG PO TABS: 5.0000 mg | ORAL_TABLET | ORAL | 0 refills | Status: DC | PRN

## 2022-02-19 NOTE — Telephone Encounter (Signed)
Patient is calling for a refill of pain medication, would like a script for the oxycodone 5 mg instant release, please advise.

## 2022-02-20 NOTE — Telephone Encounter (Signed)
Patient notified

## 2022-02-21 ENCOUNTER — Telehealth: Payer: Self-pay

## 2022-02-21 NOTE — Telephone Encounter (Signed)
oxyCODONE HCl '5MG'$  tablets  Prior authorization has been started on 02/21/22

## 2022-02-22 ENCOUNTER — Ambulatory Visit (INDEPENDENT_AMBULATORY_CARE_PROVIDER_SITE_OTHER): Payer: 59 | Admitting: Podiatry

## 2022-02-22 DIAGNOSIS — M21619 Bunion of unspecified foot: Secondary | ICD-10-CM

## 2022-02-22 DIAGNOSIS — Z9889 Other specified postprocedural states: Secondary | ICD-10-CM

## 2022-02-22 NOTE — Telephone Encounter (Signed)
Patients PA was approved.

## 2022-02-24 NOTE — Progress Notes (Signed)
Subjective: Chief Complaint  Patient presents with   Routine Post Op    Right foot foot bunion post op, POV#2, NO N/V/F/C/SOB, pain rate 2 out of 10, pt has some swelling, overall doing well      51 year old female presents with the above complaints.  She states that she is feeling better than last week.  She is in the cam boot she is using a knee scooter.  No recent injuries or changes otherwise.  No fevers or chills.  No other concerns.   Objective: AAO x3, NAD DP/PT pulses palpable bilaterally, CRT less than 3 seconds Right foot incision well coapted with sutures intact.  There is decreased edema and there is no significant erythema, cellulitis.  There is no fluctuance or crepitation.  No signs of infection.  Toe is rectus. No pain with calf compression, swelling, warmth, erythema  Assessment: Status post right foot [place] Bunionectomy  Plan: -All treatment options discussed with the patient including all alternatives, risks, complications.  -Incision appears to be healing well.  Discussed that she can wash the foot with soap and water, dry thoroughly and apply antibiotic ointment and a bandage.  Antibiotic ointment and bandage also applied today. -Continue a cam boot but she can transition to weight-bear as tolerated with she is able -Discussed range of motion exercises -Ice and elevation -Wear cam boot at all times -Patient encouraged to call the office with any questions, concerns, change in symptoms.   *X-ray next appointment  Trula Slade DPM

## 2022-02-27 ENCOUNTER — Other Ambulatory Visit: Payer: Self-pay | Admitting: Podiatry

## 2022-02-27 NOTE — Telephone Encounter (Addendum)
Patient is calling to request a refill on gabapentin,reason she had a reaction to the drug previously was because she was taking the oxycodone along with the gabapentin(causing light headiness, blurry vision ,lost balance and fell), never discussed w/ her before but her pharmacist explained that she could not take both together.  She has discontinued the oxycodone 4 days ago and now only taking ibuprofen,would like a gabapentin refill sent to pharmacy on file today.  Please advise.

## 2022-03-08 ENCOUNTER — Ambulatory Visit (INDEPENDENT_AMBULATORY_CARE_PROVIDER_SITE_OTHER): Payer: 59 | Admitting: Podiatry

## 2022-03-08 ENCOUNTER — Ambulatory Visit (INDEPENDENT_AMBULATORY_CARE_PROVIDER_SITE_OTHER): Payer: 59

## 2022-03-08 DIAGNOSIS — Z9889 Other specified postprocedural states: Secondary | ICD-10-CM | POA: Diagnosis not present

## 2022-03-08 DIAGNOSIS — M21619 Bunion of unspecified foot: Secondary | ICD-10-CM

## 2022-03-08 MED ORDER — GABAPENTIN 300 MG PO CAPS: 300.0000 mg | ORAL_CAPSULE | Freq: Three times a day (TID) | ORAL | 0 refills | Status: DC

## 2022-03-09 LAB — NMR, LIPOPROFILE
Cholesterol, Total: 157 mg/dL (ref 100–199)
HDL Particle Number: 31.8 umol/L (ref >=30.5)
HDL-C: 70 mg/dL (ref >39)
LDL Particle Number: 769 nmol/L (ref <1000)
LDL Size: 21 nm (ref >20.5)
LDL-C (NIH Calc): 76 mg/dL (ref 0–99)
LP-IR Score: 27 (ref <=45)
Small LDL Particle Number: <90 nmol/L (ref <=527)
Triglycerides: 55 mg/dL (ref 0–149)

## 2022-03-12 NOTE — Progress Notes (Addendum)
Subjective: Chief Complaint  Patient presents with   Routine Post Op    POV #3 DOS 02/07/22  --- RIGHT FOOT SURGICAL CORRECTION OF BUNION([place])- Patient is doing well. Only concern is when she puts her foot on the flood it turns purple.     51 year old female presents with the above complaints.  States that she is doing better.  No recent injury or changes otherwise.  She been walking in the cam boot.  No fevers or chills.  Objective: AAO x3, NAD DP/PT pulses palpable bilaterally, CRT less than 3 seconds; no purple discoloration noted today. Right foot incision well coapted with sutures intact.  There is decreased edema and there is no significant erythema, cellulitis.  There is no fluctuance or crepitation.  No signs of infection.  Toe is rectus.  Minimal swelling in general to the foot but no area pinpoint tenderness.  Flexor, extensor tendons appear to be intact. No pain with calf compression, swelling, warmth, erythema  Assessment: Status post right foot [place] Bunionectomy  Plan: -All treatment options discussed with the patient including all alternatives, risks, complications.  -Incision appears to be healing well.  Continue wash with soap and water daily, dry thoroughly apply a similar bandage. -Discussed continuing in the surgical boot for 2 weeks then she can gradually transition to regular shoe as tolerated.  Continue ice elevate as well as compression likely postoperative edema.  Discussed rehab exercises and she was to do this on her own at home.  If needed will refer to physical therapy. -I think the discoloration she is notices due to lack of activity as she starts to increase her mobility hopefully this will improve as well. -Patient encouraged to call the office with any questions, concerns, change in symptoms.   Return in about 3 weeks (around 03/29/2022).  *X-ray next appointment  Trula Slade DPM

## 2022-03-13 ENCOUNTER — Other Ambulatory Visit: Payer: Self-pay | Admitting: Family Medicine

## 2022-03-15 ENCOUNTER — Encounter: Payer: Self-pay | Admitting: Internal Medicine

## 2022-03-15 ENCOUNTER — Ambulatory Visit: Payer: 59 | Attending: Internal Medicine | Admitting: Internal Medicine

## 2022-03-15 DIAGNOSIS — R931 Abnormal findings on diagnostic imaging of heart and coronary circulation: Secondary | ICD-10-CM

## 2022-03-15 DIAGNOSIS — E785 Hyperlipidemia, unspecified: Secondary | ICD-10-CM

## 2022-03-15 DIAGNOSIS — E7841 Elevated Lipoprotein(a): Secondary | ICD-10-CM

## 2022-03-15 NOTE — Progress Notes (Signed)
Virtual Visit via Video Note   Because of Christine Nash's co-morbid illnesses, she is at least at moderate risk for complications without adequate follow up.  This format is felt to be most appropriate for this patient at this time.  All issues noted in this document were discussed and addressed.  A limited physical exam was performed with this format.  Please refer to the patient's chart for her consent to telehealth for [facility]      Date:  03/15/2022   ID:  Christine Nash, DOB [birth year], MRN [id number] The patient was identified using 2 identifiers.  Evaluation Performed:  Follow-Up Visit  Patient Location:  [place] [address]  Provider location:   [address] [place], [place] [address]  PCP:  Vivi Barrack, MD  Cardiologist:  Pixie Casino, MD Electrophysiologist:  None   Chief Complaint:  Follow-up dyslipidemia  History of Present Illness:    Christine Nash is a 51 y.o. female who presents via audio/video conferencing for a telehealth visit today.  This is a pleasant 51 year old female who unfortunately recently had to have surgery to have a right ulnar fracture and is currently in a cast and sling.  She was referred by Dr. Eliberto Ivory for evaluation of dyslipidemia.  Wonderfully, she is actually lost 30 to 40 pounds recently with significant dietary changes and activity prior to this recent fracture.  Despite the weight loss, however her cholesterol did not significantly improve in fact it actually has increased compared to where it was about 2 years ago.  Total cholesterol now 281, triglycerides 58, HDL 70 and LDL 199.  She does not report a strong family history of heart disease.  I wonder if her HDL of 70 may be cardioprotective for her.  Overall she reports eating a very healthy sounding diet.  She is cut out a lot of sources of saturated fats.  11/21/2020  Christine Nash returns today for  follow-up.  She seems to be tolerating rosuvastatin without any significant side effects.  Cholesterol has come down significantly.  Total cholesterol is now 172, triglycerides 54, HDL 75 and LDL of 86.  04/07/2021  Christine Nash is seen today for follow-up.  Overall she is doing well.  Her cholesterol is trending even a little lower.  Most recent labs showed total cholesterol 152, triglycerides 54, HDL 66 and LDL 75, is on combination 20 mg rosuvastatin and 10 mg of ezetimibe.  Looking back about 8 months ago total cholesterol was 281 with an LDL of 199 and 2 years prior to that the LDL was 197.  This is suggestive of possible familial hyperlipidemia however she also has significant weight loss and dietary changes and therefore this might be metabolic.  Reports she had her daughter's cholesterol tested who is 79 years old and it was reportedly slightly high.  10/06/2021  Christine Nash returns today for follow-up.  Overall she continues to do well.  Her cholesterol has trended even a little lower with total 149, triglycerides 48, HDL 69 and LDL 69.  This is on combination of ezetimibe and rosuvastatin.  I did go ahead and check an LP(a) and not surprisingly it is elevated at 267.3.  I discussed that in detail with her today and that ultimately she may be a candidate for therapy down the road but since she has had no prior coronary events she would not qualify for clinical trial at this time.  Her cholesterol  is well controlled and there really is not data to suggest adding a PCSK9 inhibitor nor is FDA approval for this.  03/15/2022  Christine Nash is seen today via video follow-up.  Overall she is doing well.  She had recent repeat lipids which appear to be well controlled.  LDL particle #769, LDL-C of 76, HDL-C of 70 and triglycerides were 55.  Small LDL particle is less than 90.  Overall very well controlled although her LP(a) is mention was significantly elevated.  Unfortunately the statin is not addressing  this.  At this point I do not think she has a indication for PCSK9 inhibitor otherwise even though her age advanced coronary disease was noted on CT, her absolute calcium score was low and she has not had any prior events.  Prior CV studies:   The following studies were reviewed today:  Chart review, lab work  PMHx:  Past Medical History:  Diagnosis Date   Acute torn meniscus of knee, unspecified laterality, initial encounter    Anemia    Anxiety    Complication of anesthesia    Depression    Endometriosis    Fracture of right ulnar styloid    Hyperlipidemia    Osteoporosis    PONV (postoperative nausea and vomiting)     Past Surgical History:  Procedure Laterality Date   BUNIONECTOMY     2008   CERVICAL SPINE SURGERY  2011,2012,2017   ACDF repair w/instrumentation   CESAREAN SECTION  2440,1027   CHOLECYSTECTOMY N/A 01/17/2019   Procedure: LAPAROSCOPIC CHOLECYSTECTOMY WITH INTRAOPERATIVE CHOLANGIOGRAM;  Surgeon: Alphonsa Overall, MD;  Location: [facility];  Service: General;  Laterality: N/A;   KNEE SURGERY     left knee   LAPAROSCOPIC SUPRACERVICAL HYSTERECTOMY     with BSO, at age 20   PERCUTANEOUS PINNING Right 08/18/2020   Procedure: Closed reduction and percutaneous pinning  of right ulnar fracture;  Surgeon: Cindra Presume, MD;  Location: [place];  Service: Plastics;  Laterality: Right;  60 min   [place]   SUPRACERVICAL ABDOMINAL HYSTERECTOMY  2013   TUBAL LIGATION  2011    FAMHx:  Family History  Problem Relation Age of Onset   Stroke Mother    COPD Mother    Bladder Cancer Father    Melanoma Father    Heart attack Father    Breast cancer Maternal Aunt    Diabetes Maternal Grandmother    Colon polyps Neg Hx    Colon cancer Neg Hx    Esophageal cancer Neg Hx    Rectal cancer Neg Hx    Stomach cancer Neg Hx     SOCHx:   reports that she has never smoked. She has never used smokeless tobacco. She reports that she does not  currently use alcohol. She reports that she does not use drugs.  ALLERGIES:  Allergies  Allergen Reactions   Gabapentin Nausea And Vomiting   Meperidine Hcl Hives   Prochlorperazine Edisylate     Other reaction(s): agitiation   Propranolol     "brain fog" fatigue    Trokendi Kellogg Er] Other (See Comments)    "brain fog"    Phentermine Palpitations    Chest pain / palpitations     MEDS:  Current Meds  Medication Sig   alendronate (FOSAMAX) 70 MG tablet Take 1 tablet (70 mg total) by mouth every 7 (seven) days. Take first thing in am with 6 oz. Water.  Be  upright after taking.  Eat nothing for one hour.   baclofen (LIORESAL) 20 MG tablet TAKE 1 TABLET BY MOUTH THREE TIMES A DAY   cholecalciferol (VITAMIN D) 25 MCG (1000 UT) tablet Take 4,000 Units by mouth daily.   FENUGREEK PO Take 2 capsules by mouth daily. '1220mg'$ /QD   gabapentin (NEURONTIN) 300 MG capsule Take 1 capsule (300 mg total) by mouth 3 (three) times daily.   ibuprofen (ADVIL) 200 MG tablet Take 800 mg by mouth every 6 (six) hours as needed for mild pain or headache.   oxyCODONE (ROXICODONE) 5 MG immediate release tablet Take 1 tablet (5 mg total) by mouth every 4 (four) hours as needed for severe pain.     ROS: Pertinent items noted in HPI and remainder of comprehensive ROS otherwise negative.  Labs/Other Tests and Data Reviewed:    Recent Labs: 04/11/2021: Magnesium 1.9 08/21/2021: ALT 42; TSH 1.56 11/29/2021: BUN 19; Creatinine, Ser 0.83; Hemoglobin 12.8; Platelets 161; Potassium 3.7; Sodium 139   Recent Lipid Panel Lab Results  Component Value Date/Time   CHOL 149 09/29/2021 08:21 AM   TRIG 48 09/29/2021 08:21 AM   HDL 69 09/29/2021 08:21 AM   CHOLHDL 2.2 09/29/2021 08:21 AM   CHOLHDL 4 08/10/2020 10:16 AM   LDLCALC 69 09/29/2021 08:21 AM    Wt Readings from Last 3 Encounters:  01/01/22 161 lb 6.4 oz (73.2 kg)  11/29/21 160 lb 15 oz (73 kg)  11/24/21 160 lb (72.6 kg)     Exam:    Vital  Signs:  There were no vitals taken for this visit.   General appearance: alert and no distress Lungs: No wheezing Abdomen: Normal weight Extremities: extremities normal, atraumatic, no cyanosis or edema Neurologic: Grossly normal  ASSESSMENT & PLAN:    Mixed dyslipidemia with LDL greater than 190 Recent significant intentional weight loss Elevated CAC score of 42, 95th percentile for age and sex matched controls Elevated LP(a)-267.3  Ms. Bunney has had significant improvement in her lipids.  At this point I think she is well treated.  Unfortunately we cannot address her elevated LP(a).  We will continue to monitor and provide treatment for that as it becomes available.  I would continue her current medications.  Plan repeat lipid NMR follow-up with me in 6 months.  COVID-19 Education: The signs and symptoms of COVID-19 were discussed with the patient and how to seek care for testing (follow up with PCP or arrange E-visit).  The importance of social distancing was discussed today.  Patient Risk:   After full review of this patients clinical status, I feel that they are at least moderate risk at this time.  Time:   Today, I have spent 15 minutes with the patient with telehealth technology discussing dyslipidemia.     Medication Adjustments/Labs and Tests Ordered: Current medicines are reviewed at length with the patient today.  Concerns regarding medicines are outlined above.   Tests Ordered: Orders Placed This Encounter  Procedures   NMR, lipoprofile    Medication Changes: No orders of the defined types were placed in this encounter.   Disposition:  in 6 month(s)  Pixie Casino, MD, [facility] [place] Director of the Advanced Lipid Disorders &  Cardiovascular Risk Reduction Clinic Diplomate of the American Board of Clinical Lipidology Attending Cardiologist  Direct Dial: [phone]  Fax: [phone]  Website:   [email or web address]  Pixie Casino, MD  03/15/2022 1:31 PM

## 2022-03-15 NOTE — Patient Instructions (Signed)
Medication Instructions:  NO CHANGES  *If you need a refill on your cardiac medications before your next appointment, please call your pharmacy*   Lab Work: FASTING lab work in 6 months -- NMR lipoprofile   If you have labs (blood work) drawn today and your tests are completely normal, you will receive your results only by: [place] (if you have MyChart) OR A paper copy in the mail If you have any lab test that is abnormal or we need to change your treatment, we will call you to review the results.   Testing/Procedures: NONE   Follow-Up: At [facility], you and your health needs are our priority.  As part of our continuing mission to provide you with exceptional heart care, we have created designated Provider Care Teams.  These Care Teams include your primary Cardiologist (physician) and Advanced Practice Providers (APPs -  Physician Assistants and Nurse Practitioners) who all work together to provide you with the care you need, when you need it.  We recommend signing up for the patient portal called "MyChart".  Sign up information is provided on this After Visit Summary.  MyChart is used to connect with patients for Virtual Visits (Telemedicine).  Patients are able to view lab/test results, encounter notes, upcoming appointments, etc.  Non-urgent messages can be sent to your provider as well.   To learn more about what you can do with MyChart, go to [email or web address].    Your next appointment:   6 months with Dr. Debara Pickett

## 2022-03-19 ENCOUNTER — Other Ambulatory Visit: Payer: Self-pay | Admitting: Podiatry

## 2022-03-19 ENCOUNTER — Encounter: Payer: Self-pay | Admitting: Internal Medicine

## 2022-04-02 ENCOUNTER — Encounter: Payer: Self-pay | Admitting: *Deleted

## 2022-04-02 ENCOUNTER — Ambulatory Visit (INDEPENDENT_AMBULATORY_CARE_PROVIDER_SITE_OTHER): Payer: 59

## 2022-04-02 ENCOUNTER — Ambulatory Visit (INDEPENDENT_AMBULATORY_CARE_PROVIDER_SITE_OTHER): Payer: 59 | Admitting: Podiatry

## 2022-04-02 DIAGNOSIS — Z9889 Other specified postprocedural states: Secondary | ICD-10-CM

## 2022-04-02 DIAGNOSIS — M21619 Bunion of unspecified foot: Secondary | ICD-10-CM

## 2022-04-05 NOTE — Progress Notes (Signed)
Subjective: No chief complaint on file.   51 year old female presents for follow-up evaluation undergoing right foot bunionectomy performed on February 07, 2022.  She is concerned about the discoloration still swelling to her foot.  She shows me a picture of her foot is swollen has a purple discoloration.  She is also concerned just proximal to this area where there is a discoloration which she describes as looking as a " second-degree burn".  Denies any fevers or chills.   Objective: AAO x3, NAD-presents today wearing flip-flops. DP/PT pulses palpable bilaterally, CRT less than 3 seconds; no purple discoloration noted today. Right foot incision well coapted with sutures intact.  No significant pain on exam however there is still some residual edema present to the foot with slight purple discoloration noted today.  She also has purple discoloration noted to the digits on the left foot as well.  There is no evidence of ulceration or gangrene.  The area just proximal to the first metatarsocuneiform joint is lighter in color but has good capillary refill time.  No pain with calf compression, swelling, warmth, erythema     Assessment: Status post right foot [place] Bunionectomy  Plan: -All treatment options discussed with the patient including all alternatives, risks, complications.  -X-rays were obtained reviewed of the right foot.  3 views were obtained.  Hardware intact.  Ostomy still noted on the dorsal cortex but with increased consolidation noted.  No evidence of acute fracture. -She has not been using compression.  Dispensed compression anklet to help control the edema.  Elevation still encouraged.  Discussed gradual transition to regular shoe as tolerated with a supportive shoe.  I did expect the color to improve.  If she continues to have discoloration we will do further work-up given she has is purple discoloration on both feet.  I asked about Raynaud's today and she states this was a concern  many years ago as well.   Return in about 4 weeks (around 04/30/2022).  X-ray next appointment  Trula Slade DPM

## 2022-04-09 ENCOUNTER — Other Ambulatory Visit: Payer: Self-pay | Admitting: Family Medicine

## 2022-04-13 ENCOUNTER — Telehealth: Payer: Self-pay | Admitting: Internal Medicine

## 2022-04-13 ENCOUNTER — Other Ambulatory Visit: Payer: Self-pay | Admitting: Obstetrics and Gynecology

## 2022-04-13 DIAGNOSIS — M810 Age-related osteoporosis without current pathological fracture: Secondary | ICD-10-CM

## 2022-04-13 NOTE — Telephone Encounter (Signed)
Called patient to schedule 6 month f/u. She states she will be having her PCP manage her lipids.

## 2022-04-13 NOTE — Telephone Encounter (Signed)
Medication refill request: fosamax Last AEX  03-02-21 Next AEX: not scheduled Last MMG (if hormonal medication request): n/a Refill authorized: pharmacy note placed for patient to call to schedule aex for further refills. Please approve 1 month if appropriate

## 2022-04-15 DIAGNOSIS — S8264XA Nondisplaced fracture of lateral malleolus of right fibula, initial encounter for closed fracture: Secondary | ICD-10-CM | POA: Diagnosis not present

## 2022-05-01 ENCOUNTER — Encounter: Payer: 59 | Admitting: Podiatry

## 2022-05-05 ENCOUNTER — Other Ambulatory Visit: Payer: Self-pay | Admitting: Obstetrics and Gynecology

## 2022-05-05 DIAGNOSIS — M810 Age-related osteoporosis without current pathological fracture: Secondary | ICD-10-CM

## 2022-05-07 ENCOUNTER — Other Ambulatory Visit: Payer: Self-pay | Admitting: Family Medicine

## 2022-05-07 NOTE — Telephone Encounter (Signed)
Med refill request: alendronate 70 mg tab  Last AEX: 04/11/21 - OV  -JJ Next AEX: Not scheduled  BMD 03/31/21 Osteoporosis  Spoke with patient. Advised OV needed for future refills of alendronate. Patient states she has transferred care, will call new provider for refill.   Rx refused.   Routing to provider for final review. Patient is agreeable to disposition. Will close encounter.

## 2022-05-24 ENCOUNTER — Encounter: Payer: Self-pay | Admitting: Family Medicine

## 2022-05-25 ENCOUNTER — Other Ambulatory Visit: Payer: Self-pay | Admitting: Family Medicine

## 2022-05-25 DIAGNOSIS — R0989 Other specified symptoms and signs involving the circulatory and respiratory systems: Secondary | ICD-10-CM

## 2022-05-25 DIAGNOSIS — I70211 Atherosclerosis of native arteries of extremities with intermittent claudication, right leg: Secondary | ICD-10-CM

## 2022-06-11 ENCOUNTER — Ambulatory Visit
Admission: RE | Admit: 2022-06-11 | Discharge: 2022-06-11 | Disposition: A | Discharge status: Home / Self Care | Payer: 59 | Source: Ambulatory Visit | Attending: Family Medicine | Admitting: Family Medicine

## 2022-06-11 DIAGNOSIS — I70211 Atherosclerosis of native arteries of extremities with intermittent claudication, right leg: Secondary | ICD-10-CM

## 2022-06-11 DIAGNOSIS — I998 Other disorder of circulatory system: Secondary | ICD-10-CM | POA: Diagnosis not present

## 2022-06-11 DIAGNOSIS — R0989 Other specified symptoms and signs involving the circulatory and respiratory systems: Secondary | ICD-10-CM

## 2022-06-21 ENCOUNTER — Encounter: Payer: Self-pay | Admitting: *Deleted

## 2022-06-22 DIAGNOSIS — N760 Acute vaginitis: Secondary | ICD-10-CM | POA: Diagnosis not present

## 2022-07-06 DIAGNOSIS — K2900 Acute gastritis without bleeding: Secondary | ICD-10-CM | POA: Diagnosis not present

## 2022-08-27 ENCOUNTER — Other Ambulatory Visit: Payer: Self-pay | Admitting: Family Medicine

## 2022-09-01 ENCOUNTER — Other Ambulatory Visit: Payer: Self-pay | Admitting: Internal Medicine

## 2022-09-18 DIAGNOSIS — J320 Chronic maxillary sinusitis: Secondary | ICD-10-CM | POA: Diagnosis not present

## 2022-09-18 DIAGNOSIS — G500 Trigeminal neuralgia: Secondary | ICD-10-CM | POA: Diagnosis not present

## 2022-09-21 DIAGNOSIS — J320 Chronic maxillary sinusitis: Secondary | ICD-10-CM | POA: Diagnosis not present

## 2022-09-21 DIAGNOSIS — G500 Trigeminal neuralgia: Secondary | ICD-10-CM | POA: Diagnosis not present

## 2022-10-29 ENCOUNTER — Encounter: Payer: Self-pay | Admitting: Family Medicine

## 2022-10-29 NOTE — Telephone Encounter (Signed)
See note

## 2022-12-13 DIAGNOSIS — Z01419 Encounter for gynecological examination (general) (routine) without abnormal findings: Secondary | ICD-10-CM | POA: Diagnosis not present

## 2022-12-13 DIAGNOSIS — Z1389 Encounter for screening for other disorder: Secondary | ICD-10-CM | POA: Diagnosis not present

## 2022-12-13 DIAGNOSIS — Z6828 Body mass index (BMI) 28.0-28.9, adult: Secondary | ICD-10-CM | POA: Diagnosis not present

## 2022-12-13 DIAGNOSIS — R6882 Decreased libido: Secondary | ICD-10-CM | POA: Diagnosis not present

## 2022-12-13 DIAGNOSIS — N760 Acute vaginitis: Secondary | ICD-10-CM | POA: Diagnosis not present

## 2022-12-13 LAB — HM PAP SMEAR: HM Pap smear: NEGATIVE

## 2022-12-17 ENCOUNTER — Encounter: Payer: Self-pay | Admitting: Family Medicine

## 2022-12-24 DIAGNOSIS — Z1231 Encounter for screening mammogram for malignant neoplasm of breast: Secondary | ICD-10-CM | POA: Diagnosis not present

## 2023-01-03 DIAGNOSIS — N760 Acute vaginitis: Secondary | ICD-10-CM | POA: Diagnosis not present

## 2023-01-03 DIAGNOSIS — B3731 Acute candidiasis of vulva and vagina: Secondary | ICD-10-CM | POA: Diagnosis not present

## 2023-01-28 DIAGNOSIS — M25872 Other specified joint disorders, left ankle and foot: Secondary | ICD-10-CM | POA: Diagnosis not present

## 2023-01-28 DIAGNOSIS — M21621 Bunionette of right foot: Secondary | ICD-10-CM | POA: Diagnosis not present

## 2023-02-12 DIAGNOSIS — M7989 Other specified soft tissue disorders: Secondary | ICD-10-CM | POA: Diagnosis not present

## 2023-02-12 DIAGNOSIS — M79671 Pain in right foot: Secondary | ICD-10-CM | POA: Diagnosis not present

## 2023-02-12 DIAGNOSIS — M7751 Other enthesopathy of right foot: Secondary | ICD-10-CM | POA: Diagnosis not present

## 2023-02-12 DIAGNOSIS — M89371 Hypertrophy of bone, right ankle and foot: Secondary | ICD-10-CM | POA: Diagnosis not present

## 2023-02-21 ENCOUNTER — Encounter: Payer: Self-pay | Admitting: Family Medicine

## 2023-02-21 ENCOUNTER — Other Ambulatory Visit: Payer: Self-pay | Admitting: Family Medicine

## 2023-02-21 DIAGNOSIS — R197 Diarrhea, unspecified: Secondary | ICD-10-CM

## 2023-02-21 DIAGNOSIS — R1030 Lower abdominal pain, unspecified: Secondary | ICD-10-CM

## 2023-02-22 ENCOUNTER — Ambulatory Visit
Admission: RE | Admit: 2023-02-22 | Discharge: 2023-02-22 | Disposition: A | Discharge status: Home / Self Care | Payer: 59 | Source: Ambulatory Visit | Attending: Family Medicine | Admitting: Family Medicine

## 2023-02-22 DIAGNOSIS — R197 Diarrhea, unspecified: Secondary | ICD-10-CM | POA: Diagnosis not present

## 2023-02-22 DIAGNOSIS — R1030 Lower abdominal pain, unspecified: Secondary | ICD-10-CM

## 2023-02-22 MED ORDER — IOPAMIDOL (ISOVUE-300) INJECTION 61%
500.0000 mL | Freq: Once | INTRAVENOUS | Status: AC | PRN
  Administered 2023-02-22: 100 mL via INTRAVENOUS

## 2023-03-21 DIAGNOSIS — M810 Age-related osteoporosis without current pathological fracture: Secondary | ICD-10-CM | POA: Diagnosis not present

## 2023-03-21 DIAGNOSIS — Z7983 Long term (current) use of bisphosphonates: Secondary | ICD-10-CM | POA: Diagnosis not present

## 2023-03-21 DIAGNOSIS — Z809 Family history of malignant neoplasm, unspecified: Secondary | ICD-10-CM | POA: Diagnosis not present

## 2023-03-21 DIAGNOSIS — M62838 Other muscle spasm: Secondary | ICD-10-CM | POA: Diagnosis not present

## 2023-03-21 DIAGNOSIS — Z888 Allergy status to other drugs, medicaments and biological substances status: Secondary | ICD-10-CM | POA: Diagnosis not present

## 2023-03-21 DIAGNOSIS — E785 Hyperlipidemia, unspecified: Secondary | ICD-10-CM | POA: Diagnosis not present

## 2023-03-21 DIAGNOSIS — G500 Trigeminal neuralgia: Secondary | ICD-10-CM | POA: Diagnosis not present

## 2023-03-21 DIAGNOSIS — Z792 Long term (current) use of antibiotics: Secondary | ICD-10-CM | POA: Diagnosis not present

## 2023-03-21 DIAGNOSIS — A0472 Enterocolitis due to Clostridium difficile, not specified as recurrent: Secondary | ICD-10-CM | POA: Diagnosis not present

## 2023-03-21 DIAGNOSIS — F325 Major depressive disorder, single episode, in full remission: Secondary | ICD-10-CM | POA: Diagnosis not present

## 2023-03-21 DIAGNOSIS — Z885 Allergy status to narcotic agent status: Secondary | ICD-10-CM | POA: Diagnosis not present

## 2023-03-21 DIAGNOSIS — Z8249 Family history of ischemic heart disease and other diseases of the circulatory system: Secondary | ICD-10-CM | POA: Diagnosis not present

## 2023-03-27 ENCOUNTER — Encounter: Payer: Self-pay | Admitting: Gastroenterology

## 2023-03-27 ENCOUNTER — Ambulatory Visit: Payer: 59 | Admitting: Gastroenterology

## 2023-03-27 VITALS — BP 110/70 | HR 64 | Ht 63.75 in | Wt 154.4 lb

## 2023-03-27 DIAGNOSIS — A498 Other bacterial infections of unspecified site: Secondary | ICD-10-CM | POA: Insufficient documentation

## 2023-03-27 NOTE — Progress Notes (Signed)
03/27/2023 Nel Havner Delauter [id number] [birth year]   HISTORY OF PRESENT ILLNESS: Is a 52 year old female who was previously a patient of Dr. Orvan Falconer, known to her only for colonoscopy in November 2022 at which time she had a single 2 mm tubular adenoma removed.  She is here today because she recently has been dealing with C. difficile.  She started having diarrhea about the second week of August.  Has never had C. difficile in the past.  Had a CT scan on 02/22/2023 that showed wall thickening and mucosal hyperemia in the sigmoid colon suggestive of infectious or inflammatory colitis.  Had a positive C. difficile study.  Took 10 days of vancomycin but had no improvement or resolution in symptoms.  Then started Dificid, which she just completed 3 days ago, 9/15.  She is currently having only a couple of bowel movements daily that are snake like pieces of stool, but not liquid or watery.  Has a little bit of mucus and some of the foul smell has returned.  She is taking a probiotic in the form of Culturelle.  Is still having urgency as well.  Past Medical History:  Diagnosis Date   Acute torn meniscus of knee, unspecified laterality, initial encounter    Anemia    Anxiety    Cervicogenic headache    Complication of anesthesia    Depression    Endometriosis    Fracture of right ulnar styloid    Hyperlipidemia    Osteoporosis    PONV (postoperative nausea and vomiting)    Spinal stenosis    Past Surgical History:  Procedure Laterality Date   BUNIONECTOMY Bilateral    2008   CERVICAL SPINE SURGERY  2011,2012,2017   ACDF repair w/instrumentation   CESAREAN SECTION  4098,1191   CHOLECYSTECTOMY N/A 01/17/2019   Procedure: LAPAROSCOPIC CHOLECYSTECTOMY WITH INTRAOPERATIVE CHOLANGIOGRAM;  Surgeon: Ovidio Kin, MD;  Location: [facility];  Service: General;  Laterality: N/A;   KNEE ARTHROSCOPY Left    left knee   LAPAROSCOPIC SUPRACERVICAL HYSTERECTOMY     with BSO, at age 27    PERCUTANEOUS PINNING Right 08/18/2020   Procedure: Closed reduction and percutaneous pinning  of right ulnar fracture;  Surgeon: Allena Napoleon, MD;  Location: [facility];  Service: Plastics;  Laterality: Right;  60 min   SHOULDER ARTHROSCOPY Right    1996   TUBAL LIGATION  2011    reports that she has never smoked. She has never used smokeless tobacco. She reports that she does not currently use alcohol. She reports that she does not use drugs. family history includes ADD / ADHD in her son; Anxiety disorder in her mother and son; Autism spectrum disorder in her daughter and son; Bladder Cancer in her father; Breast cancer in her maternal aunt; COPD in her mother; Cancer in her paternal grandfather; Depression in her mother; Diabetes in her maternal grandmother; Heart attack in her father; Heart disease in her mother; Hepatitis C in her half-sister; Hypertension in her mother; Macular degeneration in her father; Melanoma in her father; Other in her half-brother; Stroke in her maternal grandmother and mother. Allergies  Allergen Reactions   Gabapentin Nausea And Vomiting   Meperidine Hcl Hives   Prochlorperazine Edisylate     Other reaction(s): agitiation   Propranolol     "brain fog" fatigue    [employer]] Other (See Comments)    "brain fog"    Phentermine Palpitations    Chest pain /  palpitations       Outpatient Encounter Medications as of 03/27/2023  Medication Sig   alendronate (FOSAMAX) 70 MG tablet TAKE 1 TABLET BY MOUTH EVERY 7 DAYS, FIRST THING IN MORNING, STAY UPRIGHT AND NO NOT EAT FOR 1 HOUR   B Complex Vitamins (VITAMIN-B COMPLEX PO) Take 1 capsule by mouth daily.   baclofen (LIORESAL) 20 MG tablet TAKE 1 TABLET BY MOUTH THREE TIMES A DAY   busPIRone (BUSPAR) 7.5 MG tablet Take 1 tablet by mouth as needed.   cholecalciferol (VITAMIN D) 25 MCG (1000 UT) tablet Take 4,000 Units by mouth daily.   FENUGREEK PO Take 2 capsules by mouth daily.  1220mg /QD   ibuprofen (ADVIL) 200 MG tablet Take 800 mg by mouth as needed for mild pain or headache.   rosuvastatin (CRESTOR) 20 MG tablet TAKE 1 TABLET BY MOUTH EVERY DAY   ondansetron (ZOFRAN-ODT) 4 MG disintegrating tablet Take 4 mg by mouth 4 (four) times daily as needed. (Patient not taking: Reported on 03/27/2023)   [DISCONTINUED] gabapentin (NEURONTIN) 300 MG capsule TAKE 1 CAPSULE BY MOUTH THREE TIMES A DAY   [DISCONTINUED] oxyCODONE (ROXICODONE) 5 MG immediate release tablet Take 1 tablet (5 mg total) by mouth every 4 (four) hours as needed for severe pain.   No facility-administered encounter medications on file as of 03/27/2023.     REVIEW OF SYSTEMS  : All other systems reviewed and negative except where noted in the History of Present Illness.   PHYSICAL EXAM: BP 110/70 (BP Location: Left Arm, Patient Position: Sitting, Cuff Size: Normal)   Pulse 64   Ht 5' 3.75" (1.619 m) Comment: height measured without shoes  Wt 154 lb 6 oz (70 kg)   BMI 26.71 kg/m  General: Well developed white female in no acute distress Head: Normocephalic and atraumatic Eyes:  Sclerae anicteric, conjunctive pink. Ears: Normal auditory acuity Lungs: Clear throughout to auscultation; no W/R/R. Heart: Regular rate and rhythm; no M/R/G. Abdomen: Soft, non-distended.  BS present and somewhat hyperactive.  Non-tender. Musculoskeletal: Symmetrical with no gross deformities  Skin: No lesions on visible extremities Extremities: No edema  Neurological: Alert oriented x 4, grossly non-focal Psychological:  Alert and cooperative. Normal mood and affect  ASSESSMENT AND PLAN: *C. difficile colitis: Had a CT scan on 02/22/2023 that showed wall thickening and mucosal hyperemia in the sigmoid colon suggestive of infectious or inflammatory colitis.  Had a positive C. difficile study.  Took 10 days of vancomycin but had no improvement or resolution in symptoms.  Then started Dificid, which she just completed 3 days  ago, 9/15.  She is currently having only a couple of bowel movements daily that are snake like pieces of stool, but not liquid or watery.  Has a little bit of mucus and some of the foul smell has returned.  She is taking a probiotic in the form of Culturelle which she can continue.  Otherwise I have asked her to begin taking Benefiber 2 teaspoons mixed in 8 ounces of liquid daily to help with the bulking of the stool.  I am hesitant to retest or retreat right now, but she knows to contact us back if symptoms worsen again.  At that point would retest and if positive treat with vancomycin taper.   CC:  Ardith Dark, MD

## 2023-03-27 NOTE — Patient Instructions (Signed)
Start Benefiber 2 teaspoons in 8 ounces of liquid daily.   Call back if symptoms worsen.  _______________________________________________________  If your blood pressure at your visit was 140/90 or greater, please contact your primary care physician to follow up on this.  _______________________________________________________  If you are age 53 or older, your body mass index should be between 23-30. Your Body mass index is 26.71 kg/m. If this is out of the aforementioned range listed, please consider follow up with your Primary Care Provider.  If you are age 20 or younger, your body mass index should be between 19-25. Your Body mass index is 26.71 kg/m. If this is out of the aformentioned range listed, please consider follow up with your Primary Care Provider.   ________________________________________________________  The [facility] providers would like to encourage you to use [facility] to communicate with providers for non-urgent requests or questions.  Due to long hold times on the telephone, sending your provider a message by [facility] may be a faster and more efficient way to get a response.  Please allow 48 business hours for a response.  Please remember that this is for non-urgent requests.  _______________________________________________________

## 2023-04-01 NOTE — Progress Notes (Signed)
Agree with the assessment and plan as outlined by Doug Sou, PA-C.  Agree with plan to hold off on retesting as that would likely still be positive and not indicative of treatment efficacy.  Gree with plan for fiber supplement, continued probiotics and monitoring.  If symptoms not resolving or start to worsen, can retest with plan for extended vancomycin taper course as outlined, and possibly with treatment of Vowst afterwards.  Medhansh Brinkmeier, DO, [facility]

## 2023-05-10 ENCOUNTER — Other Ambulatory Visit: Payer: Self-pay | Admitting: Internal Medicine

## 2023-06-11 IMAGING — MR MR SHOULDER*L* W/O CM
4 of 5 series · 21 of 40 positions shown · non-contrast
Comparison: None.

CLINICAL DATA: Left shoulder pain.  Decreased range of motion.

EXAM:
MRI OF THE LEFT SHOULDER WITHOUT CONTRAST
TECHNIQUE: Multiplanar, multisequence MR imaging of the shoulder was performed.
No intravenous contrast was administered.

[Series 5: T2 fat-sat · axial · left · 3.0mm · 0.47mm/px · z∈[-36,+64]mm · 8 of 27 slices shown (1 of 3)]
[im 1/27]
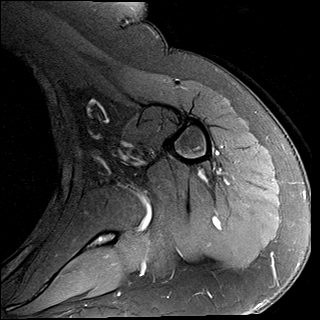
[im 3/27]
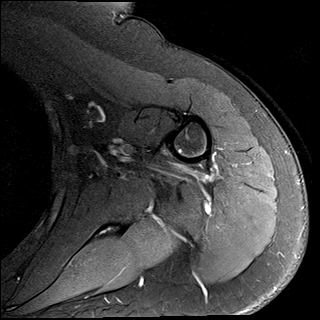
[im 9/27]
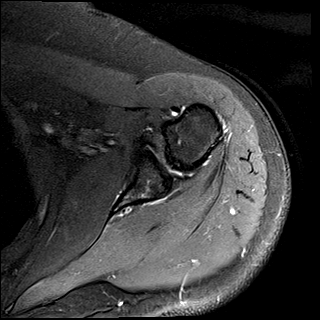
[im 12/27]
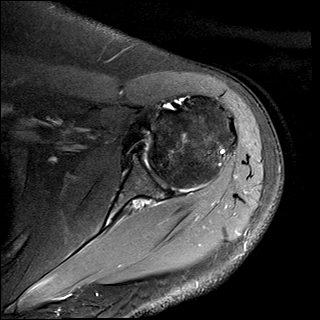
[im 15/27]
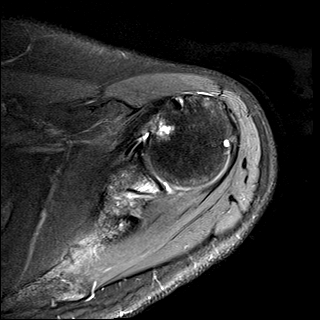
[im 18/27]
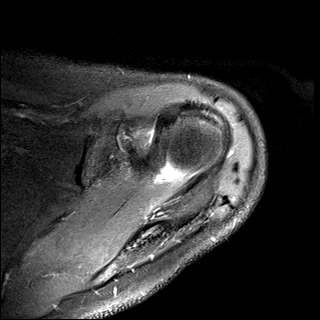
[im 24/27]
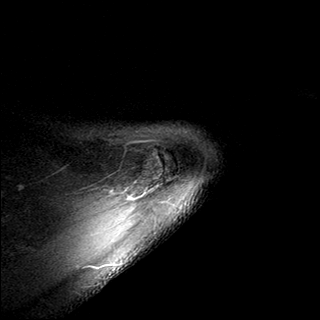
[im 27/27]
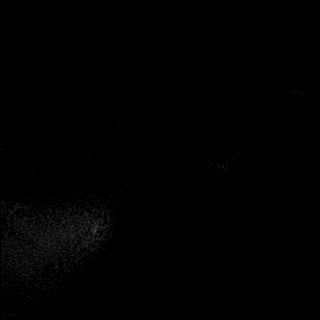

[Series 7: T2 fat-sat · oblique · left · 4.0mm · 0.44mm/px · 3 of 19 slices shown (2 of 3)]
[im 3/19]
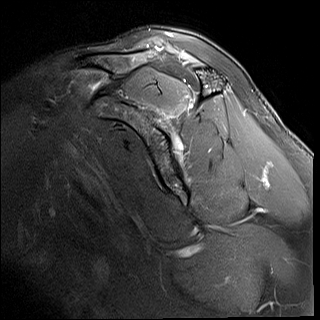
[im 11/19]
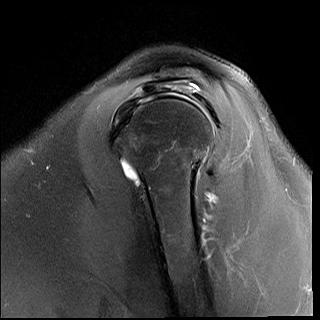
[im 16/19]
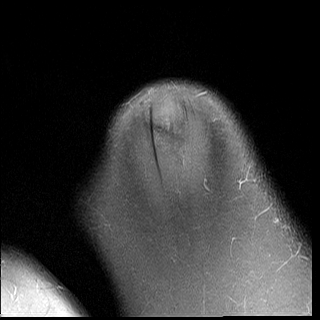

[Series 8: T2 fat-sat · oblique · left · 4.0mm · 0.22mm/px · 3 of 17 slices shown (3 of 3)]
[im 3/17]
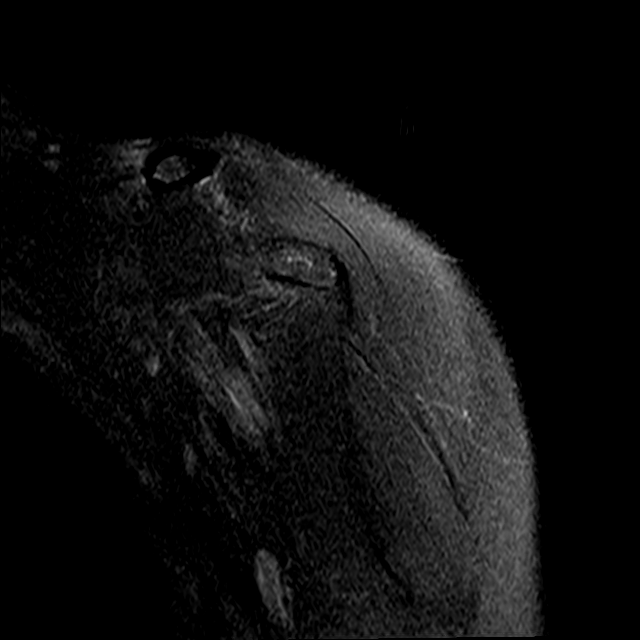
[im 9/17]
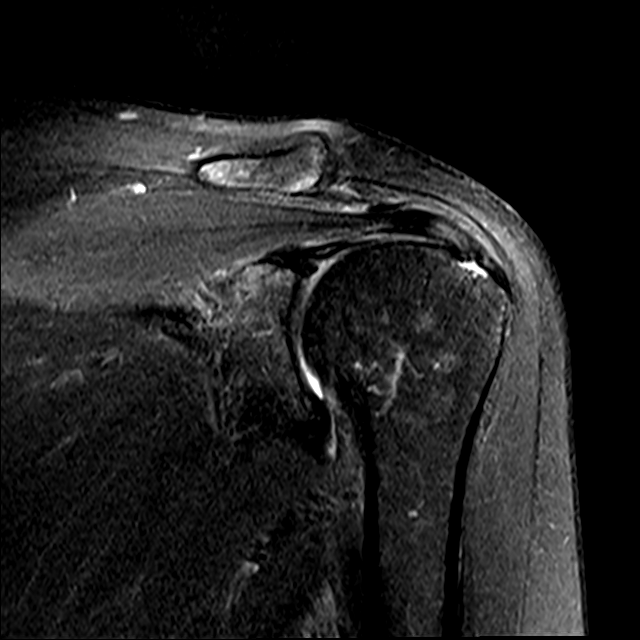
[im 14/17]
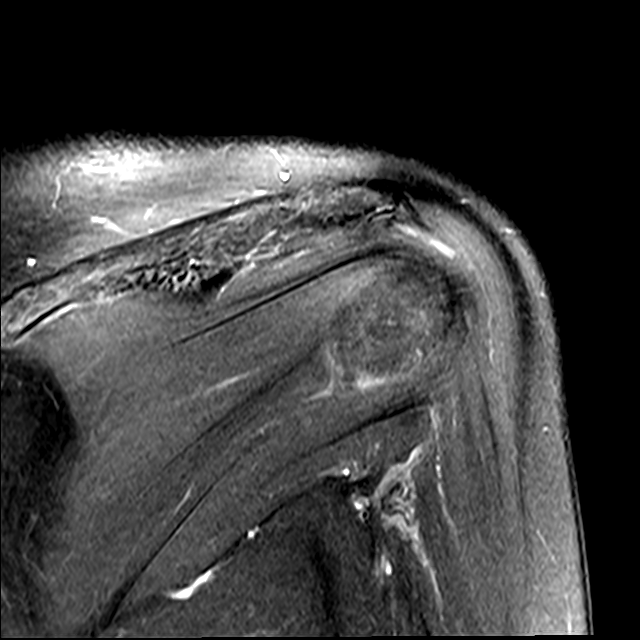

[Series 9: PD · oblique · left · 4.0mm · 0.22mm/px · 7 of 17 slices shown]
[im 1/17]
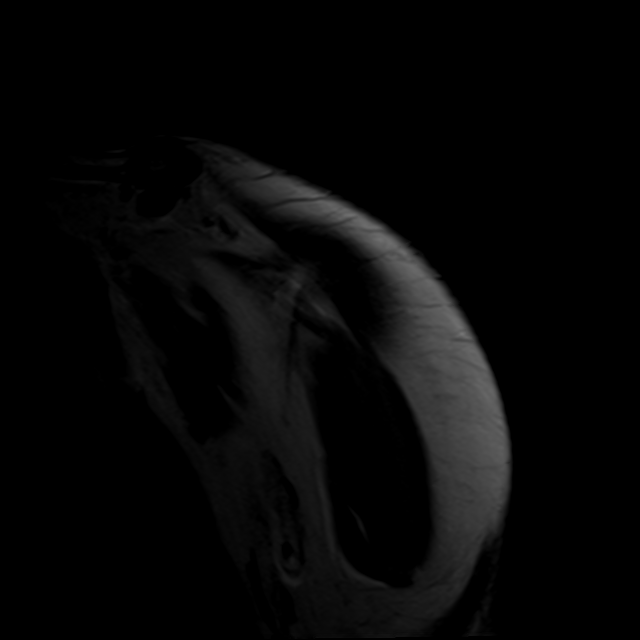
[im 3/17]
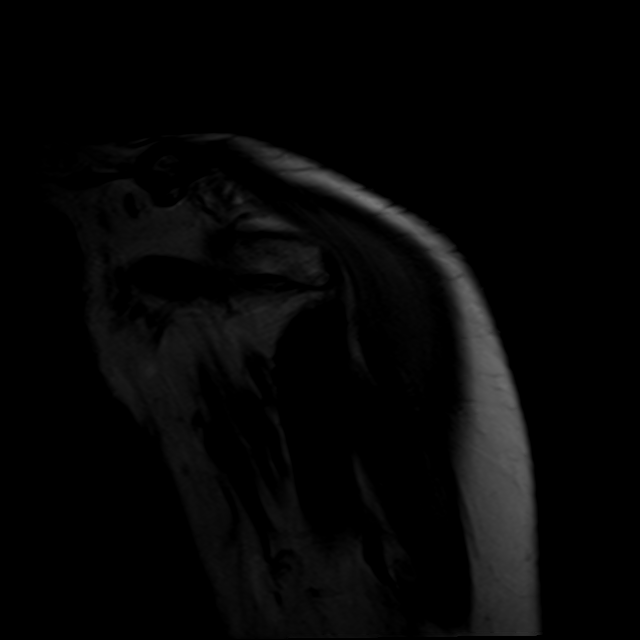
[im 6/17]
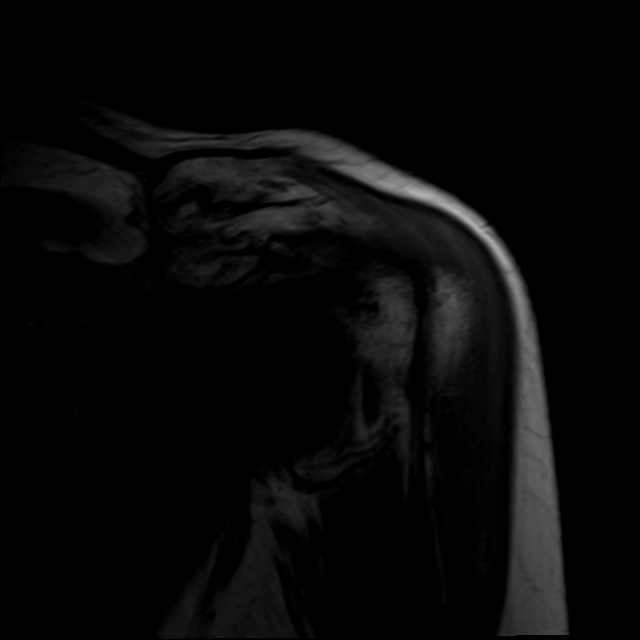
[im 9/17]
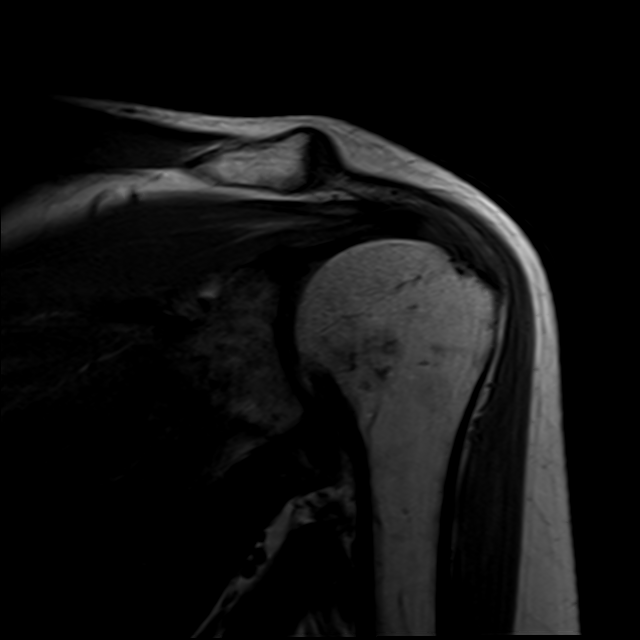
[im 11/17]
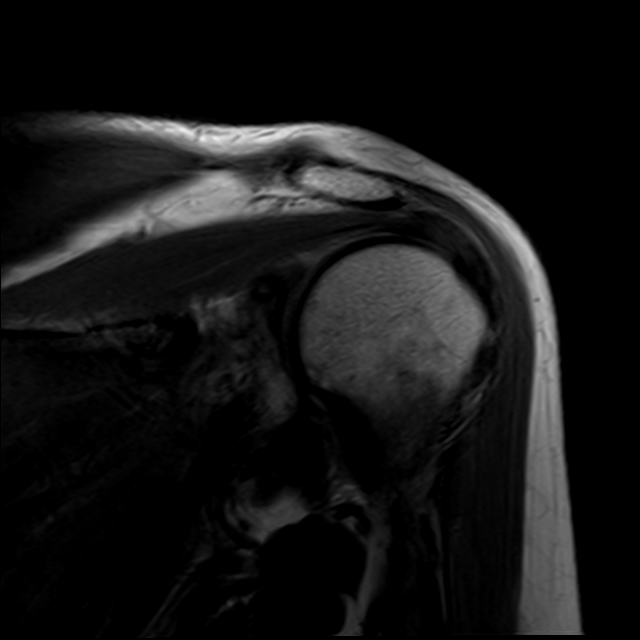
[im 14/17]
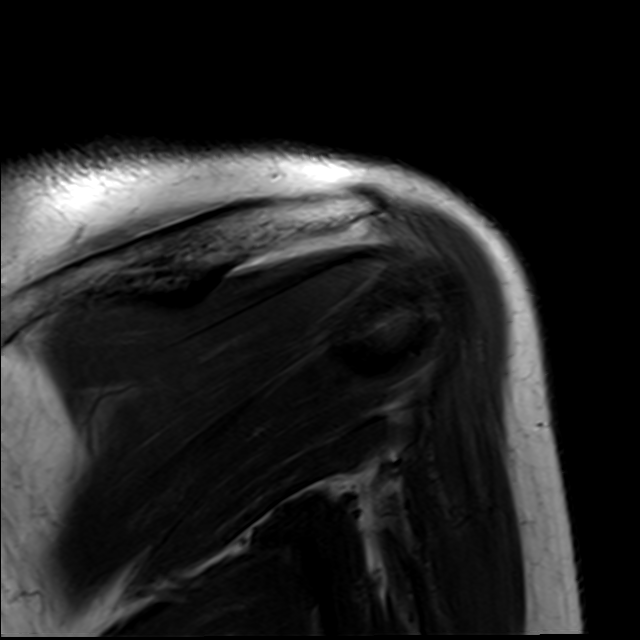
[im 17/17]
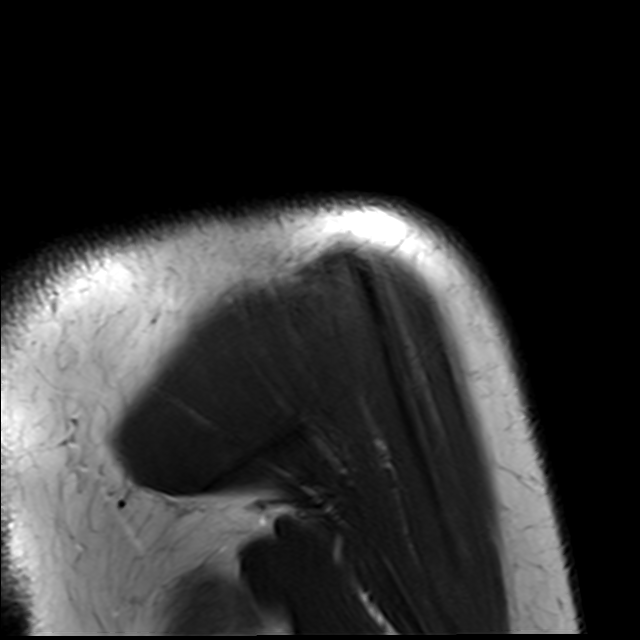

[21 of 40 positions shown; findings below may reference images not displayed]

FINDINGS: Rotator cuff: Moderate tendinosis of the supraspinatus tendon
without a tear. Infraspinatus tendon is intact. Teres minor tendon
is intact. Subscapularis tendon is intact.

Muscles: No muscle atrophy or edema. No intramuscular fluid
collection or hematoma.

Biceps Long Head: Intraarticular and extraarticular portions of the
biceps tendon are intact.

Acromioclavicular Joint: Mild arthropathy of the acromioclavicular
joint. No subacromial/subdeltoid bursal fluid.

Glenohumeral Joint: No joint effusion. No chondral defect.

Labrum: Limited evaluation secondary lack of intra-articular fluid.
Bone marrow edema in the superior posterior corner of the glenoid
with a mildly displaced fracture.

Bones: No fracture or dislocation. No aggressive osseous lesion.

Other: No fluid collection or hematoma.
IMPRESSION: 1. Moderate tendinosis of the supraspinatus tendon without a tear.

2. Mildly displaced fracture of the superior posterior corner of the
glenoid with surrounding bone marrow edema.
